# Patient Record
Sex: Female | Born: 1945
Health system: Southern US, Community
[De-identification: ages and names within clinical notes are randomized; demographics above are authoritative.]

## PROBLEM LIST (undated history)

## (undated) DIAGNOSIS — I10 Essential (primary) hypertension: Secondary | ICD-10-CM

## (undated) DIAGNOSIS — E0822 Diabetes mellitus due to underlying condition with diabetic chronic kidney disease: Principal | ICD-10-CM

## (undated) DIAGNOSIS — M1990 Unspecified osteoarthritis, unspecified site: Secondary | ICD-10-CM

## (undated) DIAGNOSIS — Z794 Long term (current) use of insulin: Principal | ICD-10-CM

## (undated) DIAGNOSIS — E785 Hyperlipidemia, unspecified: Secondary | ICD-10-CM

## (undated) DIAGNOSIS — N183 Chronic kidney disease, stage 3 (moderate): Principal | ICD-10-CM

## (undated) HISTORY — PX: OTHER SURGICAL HISTORY: SHX169

## (undated) HISTORY — DX: Hyperlipidemia, unspecified: E78.5

## (undated) HISTORY — DX: Chronic kidney disease, stage 3 (moderate): N18.3

## (undated) HISTORY — DX: Diabetes mellitus due to underlying condition with diabetic chronic kidney disease: E08.22

## (undated) HISTORY — DX: Unspecified osteoarthritis, unspecified site: M19.90

## (undated) HISTORY — DX: Long term (current) use of insulin: Z79.4

## (undated) HISTORY — PX: CATARACT EXTRACTION, BILATERAL: SHX1313

## (undated) HISTORY — DX: Essential (primary) hypertension: I10

---

## 2010-10-13 ENCOUNTER — Ambulatory Visit: Payer: Self-pay | Admitting: Cardiology

## 2012-02-20 ENCOUNTER — Encounter: Payer: Self-pay | Admitting: Family Medicine

## 2012-02-20 ENCOUNTER — Ambulatory Visit (INDEPENDENT_AMBULATORY_CARE_PROVIDER_SITE_OTHER): Payer: Medicare PPO | Admitting: Family Medicine

## 2012-02-20 VITALS — BP 200/92 | HR 74 | Resp 16 | Ht 69.5 in | Wt 192.4 lb

## 2012-02-20 DIAGNOSIS — I10 Essential (primary) hypertension: Secondary | ICD-10-CM

## 2012-02-20 DIAGNOSIS — IMO0002 Reserved for concepts with insufficient information to code with codable children: Secondary | ICD-10-CM

## 2012-02-20 DIAGNOSIS — Z1231 Encounter for screening mammogram for malignant neoplasm of breast: Secondary | ICD-10-CM

## 2012-02-20 DIAGNOSIS — E785 Hyperlipidemia, unspecified: Secondary | ICD-10-CM

## 2012-02-20 DIAGNOSIS — L609 Nail disorder, unspecified: Secondary | ICD-10-CM

## 2012-02-20 DIAGNOSIS — M5136 Other intervertebral disc degeneration, lumbar region: Secondary | ICD-10-CM | POA: Insufficient documentation

## 2012-02-20 DIAGNOSIS — E119 Type 2 diabetes mellitus without complications: Secondary | ICD-10-CM

## 2012-02-20 DIAGNOSIS — E663 Overweight: Secondary | ICD-10-CM

## 2012-02-20 DIAGNOSIS — L84 Corns and callosities: Secondary | ICD-10-CM

## 2012-02-20 NOTE — Progress Notes (Signed)
  Subjective:    Patient ID: Amanda Krueger, female    DOB: 1946/11/30, 66 y.o.   MRN: [id number]  HPI   Patient here to establish care. Previous PCP [facility].     Hypertension- she's had uncontrolled blood pressure for many years. Her systolic ranges the [id number] to 200s. She does not take her medication as prescribed because she does not like to take pills. She denies headache, shortness of breath, chest pain. She states she feels fine when her blood pressure is elevated. Denies change in vision.  Diabetes mellitus- she is unsure of her last A1c. She is due for labs. She states her fastings are in the 130s. She complains of hypoglycemia when she tries to eat healthy. Mild neuropathy in feet  nail turning black-she has to nails on both feet which are very thick long, long and have turned black. She would like for these to be removed.  Degenerative disc disease-she takes Celebrex as needed for her degenerative disc disease which is located in her back and neck. She's been on disability since 2000  Due for a mammogram Due for Pap smear  Eye Dr. Dr. Clovia Cuff    Review of Systems   GEN- denies fatigue, fever, weight loss,weakness, recent illness HEENT- denies eye drainage, change in vision, nasal discharge, CVS- denies chest pain, palpitations RESP- denies SOB, cough, wheeze ABD- denies N/V, change in stools, abd pain GU- denies dysuria, hematuria, dribbling, incontinence MSK- + joint pain, muscle aches, injury Neuro- denies headache, dizziness, syncope, seizure activity      Objective:   Physical Exam GEN- NAD, alert and oriented x3 HEENT- PERRL, EOMI, non injected sclera, pink conjunctiva, MMM, oropharynx clear Neck- Supple, no thyromegaly, no bruit  CVS- RRR, no murmur RESP-CTAB ABD- NABS,soft, NT,ND EXT-1+ pedal edema Pulses- Radial, DP- 2+ Feet- Right foot 2nd digit- thickened,long nail black in color, large callus of great toe- ?preulcer non tender, mild  erythema, left foot- similar nail on 3rd digit       Assessment & Plan:

## 2012-02-20 NOTE — Patient Instructions (Signed)
It was nice to meet you today! Please take your blood pressure medications as soon as you leave today! Get the labs done fasting- do not eat after midnight I will refer you to a podiatrist F/U in 2 weeks.

## 2012-02-21 ENCOUNTER — Encounter: Payer: Self-pay | Admitting: Family Medicine

## 2012-02-21 DIAGNOSIS — L989 Disorder of the skin and subcutaneous tissue, unspecified: Secondary | ICD-10-CM | POA: Insufficient documentation

## 2012-02-21 DIAGNOSIS — L609 Nail disorder, unspecified: Secondary | ICD-10-CM | POA: Insufficient documentation

## 2012-02-21 DIAGNOSIS — E663 Overweight: Secondary | ICD-10-CM | POA: Insufficient documentation

## 2012-02-21 DIAGNOSIS — L84 Corns and callosities: Secondary | ICD-10-CM | POA: Insufficient documentation

## 2012-02-21 NOTE — Assessment & Plan Note (Signed)
Concern for a pre-ulcerative callus on her great toe, will have podiatry treat

## 2012-02-21 NOTE — Assessment & Plan Note (Signed)
Check lipid panel  

## 2012-02-21 NOTE — Assessment & Plan Note (Signed)
Pt advised to take her medications daily as prescribed, she did not take anything this AM, based on today's visit it may be very difficult to get her to take medications. Explained risks of elevated blood pressure

## 2012-02-21 NOTE — Assessment & Plan Note (Signed)
Continue celebrex.  

## 2012-02-21 NOTE — Assessment & Plan Note (Signed)
Obtain A1C continue Lantus and Metformin

## 2012-02-21 NOTE — Assessment & Plan Note (Signed)
Her nails look very concerning based on the color change in growth, suprisingly there are only a few with this appearance, will refer to podiatry to have removed or treated

## 2012-02-22 NOTE — Progress Notes (Signed)
Patient ID: Amanda Krueger, female   DOB: [birth year], 66 y.o.   MRN: [id number] I received patient's records. Her diabetes has been very uncontrolled. Her last PCP was trying to send her to an endocrinologist. Her last A1c was 13.6% in May of 2012. LDL 168 She has been very noncompliant with her medications per her records

## 2012-03-02 LAB — LIPID PANEL
HDL: 62 mg/dL (ref >39)
Triglycerides: 157 mg/dL — ABNORMAL HIGH (ref <150)

## 2012-03-02 LAB — TSH: TSH: 0.995 u[IU]/mL (ref 0.350–4.500)

## 2012-03-02 LAB — COMPREHENSIVE METABOLIC PANEL
ALT: 18 U/L (ref 0–35)
AST: 16 U/L (ref 0–37)
Albumin: 3.8 g/dL (ref 3.5–5.2)
Alkaline Phosphatase: 90 U/L (ref 39–117)
Glucose, Bld: 129 mg/dL — ABNORMAL HIGH (ref 70–99)
Potassium: 4.3 mEq/L (ref 3.5–5.3)
Sodium: 138 mEq/L (ref 135–145)
Total Protein: 7 g/dL (ref 6.0–8.3)

## 2012-03-02 LAB — CBC
MCH: 28.3 pg (ref 26.0–34.0)
MCHC: 31.3 g/dL (ref 30.0–36.0)
Platelets: 189 10*3/uL (ref 150–400)
RBC: 4.46 MIL/uL (ref 3.87–5.11)
RDW: 14.6 % (ref 11.5–15.5)

## 2012-03-02 LAB — HEMOGLOBIN A1C: Hgb A1c MFr Bld: 11.3 % — ABNORMAL HIGH (ref <5.7)

## 2012-03-05 ENCOUNTER — Ambulatory Visit (INDEPENDENT_AMBULATORY_CARE_PROVIDER_SITE_OTHER): Payer: Medicare PPO | Admitting: Family Medicine

## 2012-03-05 ENCOUNTER — Encounter: Payer: Self-pay | Admitting: Family Medicine

## 2012-03-05 VITALS — BP 176/86 | HR 70 | Resp 18 | Ht 69.5 in | Wt 191.1 lb

## 2012-03-05 DIAGNOSIS — N182 Chronic kidney disease, stage 2 (mild): Secondary | ICD-10-CM

## 2012-03-05 DIAGNOSIS — E785 Hyperlipidemia, unspecified: Secondary | ICD-10-CM

## 2012-03-05 DIAGNOSIS — I10 Essential (primary) hypertension: Secondary | ICD-10-CM

## 2012-03-05 DIAGNOSIS — E119 Type 2 diabetes mellitus without complications: Secondary | ICD-10-CM

## 2012-03-05 MED ORDER — LISINOPRIL-HYDROCHLOROTHIAZIDE 20-12.5 MG PO TABS: 1.0000 | ORAL_TABLET | Freq: Every day | ORAL | Status: DC

## 2012-03-05 MED ORDER — INSULIN ASPART 100 UNIT/ML ~~LOC~~ SOLN: SUBCUTANEOUS | Status: DC

## 2012-03-05 MED ORDER — ATORVASTATIN CALCIUM 20 MG PO TABS: 20.0000 mg | ORAL_TABLET | Freq: Every day | ORAL | Status: AC

## 2012-03-05 MED ORDER — METOPROLOL TARTRATE 25 MG PO TABS: 25.0000 mg | ORAL_TABLET | Freq: Two times a day (BID) | ORAL | Status: DC

## 2012-03-05 NOTE — Patient Instructions (Signed)
For your diabetes- continue your lantus Start the novolog inject 4 units with each meal  Start the lipitor at bedtime Your blood pressure is high, I have started metoprolol twice a day  Continue your lisinopril and HCTZ, when you run out start the combination pill twice a day I will refer you to a diabetes specialist F/U in 4 weeks

## 2012-03-06 DIAGNOSIS — N185 Chronic kidney disease, stage 5: Secondary | ICD-10-CM | POA: Insufficient documentation

## 2012-03-06 DIAGNOSIS — N184 Chronic kidney disease, stage 4 (severe): Secondary | ICD-10-CM | POA: Insufficient documentation

## 2012-03-06 MED ORDER — LISINOPRIL-HYDROCHLOROTHIAZIDE 20-12.5 MG PO TABS: 1.0000 | ORAL_TABLET | Freq: Two times a day (BID) | ORAL | Status: DC

## 2012-03-06 NOTE — Assessment & Plan Note (Addendum)
Will add 4 units of short acting insulin with meals, refer to endocrine

## 2012-03-06 NOTE — Progress Notes (Signed)
  Subjective:    Patient ID: Amanda Krueger, female    DOB: 27-Oct-1946, 66 y.o.   MRN: [id number]  HPI  Pt here to f/u last visit, taking BP meds daily, no side effects DM- A1C obtained 11.3%, she is taking 45 units of insulin most nights, we discussed her previous non compliance with medications and previous PCP's notes, she admitted she does not like to take her medications and did not think she needed them. She has not heard from the foot doctor   Review of Systems - per above       Objective:   Physical Exam GEN- NAD, alert and oriented x3 CVS- RRR, no murmur RESP-CTAB EXT-1+ pedal edema Pulses- Radial, DP- 2+       Assessment & Plan:

## 2012-03-06 NOTE — Assessment & Plan Note (Signed)
We had a long discussion about her health and co-morbidities she agreed she needs to be compliant or will began to have complications. Will add Toprol to medications, her ACE will be combined with her HCTZ

## 2012-03-06 NOTE — Assessment & Plan Note (Signed)
Will monitor renal function, may need renal if this continues to decline

## 2012-03-06 NOTE — Assessment & Plan Note (Signed)
Start lipitor

## 2012-03-22 ENCOUNTER — Telehealth: Payer: Self-pay | Admitting: Family Medicine

## 2012-04-02 ENCOUNTER — Telehealth: Payer: Self-pay | Admitting: Family Medicine

## 2012-04-02 ENCOUNTER — Encounter: Payer: Self-pay | Admitting: Family Medicine

## 2012-04-02 ENCOUNTER — Ambulatory Visit (INDEPENDENT_AMBULATORY_CARE_PROVIDER_SITE_OTHER): Payer: Medicare PPO | Admitting: Family Medicine

## 2012-04-02 VITALS — BP 170/84 | HR 57 | Resp 15 | Ht 69.5 in | Wt 186.8 lb

## 2012-04-02 DIAGNOSIS — I10 Essential (primary) hypertension: Secondary | ICD-10-CM

## 2012-04-02 DIAGNOSIS — E119 Type 2 diabetes mellitus without complications: Secondary | ICD-10-CM

## 2012-04-02 MED ORDER — LOSARTAN POTASSIUM 50 MG PO TABS: 50.0000 mg | ORAL_TABLET | Freq: Every day | ORAL | Status: DC

## 2012-04-02 NOTE — Patient Instructions (Addendum)
To use up your medication- Take 2 of HCTZ 12.5mg  tablets daily, along with 1 lisinopril (40mg ) daily. When those are gone, then take the combination pill lisinopril/HCTZ- take 2 tablets daily  Take metoprolol twice a day Take the Cozaar  Stop the atenolol  Pt has referral from March 3rd for podiatry F/U 3 months

## 2012-04-03 NOTE — Assessment & Plan Note (Signed)
Blood pressure still uncontrolled but improved, today she brought all of her bottles in,Her Zestoretic though prescribed twice a day is only once a day on the bottle, she also has a bottle of Atenolol which she did not bring to our initial visit.   See instructions below. Basically she will be on HCTZ 25 mg total daily with lisinopril 40 mg daily. Cozaar 50 mg daily and Lopressor 25 mg twice a day.

## 2012-04-03 NOTE — Assessment & Plan Note (Signed)
Uncontrolled followed by endocrine,

## 2012-04-03 NOTE — Progress Notes (Signed)
  Subjective:    Patient ID: Amanda Krueger, female    DOB: [birth year], 66 y.o.   MRN: [id number]  HPI  Patient here for interim followup on diabetes and hypertension. She has all her medications here today for medication management. Reviewed last endocrine note. She's been followed by Dr. Dorris Fetch for her diabetes mellitus. She does note that she's had some hypoglycemia therefore she is on sliding scale insulin as well as Lantus 20 units. She has been trying to watch her diet  Review of Systems  GEN- denies fatigue, fever, weight loss,weakness, recent illness HEENT- denies eye drainage, change in vision, nasal discharge, CVS- denies chest pain, palpitations RESP- denies SOB, cough, wheeze ABD- denies N/V, change in stools, abd pain GU- denies dysuria, hematuria, dribbling, incontinence MSK- + joint pain, muscle aches, injury Neuro- denies headache, dizziness, syncope, seizure activity       Objective:   Physical Exam GEN- NAD, alert and oriented x3 CVS- RRR, no murmur RESP-CTAB EXT-1+ pedal edema R>L Pulses- Radial, DP- 2+       Assessment & Plan:

## 2012-05-09 ENCOUNTER — Other Ambulatory Visit: Payer: Self-pay | Admitting: Cardiology

## 2012-05-09 DIAGNOSIS — L97509 Non-pressure chronic ulcer of other part of unspecified foot with unspecified severity: Secondary | ICD-10-CM

## 2012-05-14 ENCOUNTER — Encounter (INDEPENDENT_AMBULATORY_CARE_PROVIDER_SITE_OTHER): Payer: Medicare PPO

## 2012-05-14 DIAGNOSIS — L97509 Non-pressure chronic ulcer of other part of unspecified foot with unspecified severity: Secondary | ICD-10-CM

## 2012-05-14 DIAGNOSIS — I739 Peripheral vascular disease, unspecified: Secondary | ICD-10-CM

## 2012-06-27 ENCOUNTER — Encounter: Payer: Self-pay | Admitting: Family Medicine

## 2012-07-02 ENCOUNTER — Ambulatory Visit: Payer: Medicare PPO | Admitting: Family Medicine

## 2012-08-13 ENCOUNTER — Ambulatory Visit (INDEPENDENT_AMBULATORY_CARE_PROVIDER_SITE_OTHER): Payer: Medicare PPO | Admitting: Family Medicine

## 2012-08-13 ENCOUNTER — Encounter: Payer: Self-pay | Admitting: Family Medicine

## 2012-08-13 VITALS — BP 164/84 | HR 62 | Resp 18 | Ht 69.5 in | Wt 170.1 lb

## 2012-08-13 DIAGNOSIS — E663 Overweight: Secondary | ICD-10-CM

## 2012-08-13 DIAGNOSIS — E785 Hyperlipidemia, unspecified: Secondary | ICD-10-CM

## 2012-08-13 DIAGNOSIS — I10 Essential (primary) hypertension: Secondary | ICD-10-CM

## 2012-08-13 DIAGNOSIS — E119 Type 2 diabetes mellitus without complications: Secondary | ICD-10-CM

## 2012-08-13 NOTE — Progress Notes (Signed)
  Subjective:    Patient ID: Amanda Krueger, female    DOB: 1946-11-20, 66 y.o.   MRN: [id number]  HPI Patient here to followup blood pressure. She's been followed by endocrinology for her diabetes mellitus. Today she brings in multiple bottles again she has not been taking the medications I prescribed on previous visit which is hydrochlorothiazide/lisinopril and metoprolol. She has no specific concerns today. She has difficulty sticking to the diet for diabetes she has lost 20 pounds however feels like she is starving herself at times. She's been seen by podiatry   Review of Systems  GEN- denies fatigue, fever, weight loss,weakness, recent illness HEENT- denies eye drainage, change in vision, nasal discharge, CVS- denies chest pain, palpitations RESP- denies SOB, cough, wheeze ABD- denies N/V, change in stools, abd pain GU- denies dysuria, hematuria, dribbling, incontinence MSK- + joint pain, muscle aches, injury Neuro- denies headache, dizziness, syncope, seizure activity      Objective:   Physical Exam GEN- NAD, alert and oriented x3 HEENT- PERRL, EOMI, non injected sclera, pink conjunctiva, MMM, oropharynx clear CVS- RRR, no murmur RESP-CTAB ABD-NABS,soft,NT,ND  EXT- 1+ right pedal edema Pulses- Radial, DP- 2+        Assessment & Plan:

## 2012-08-13 NOTE — Patient Instructions (Signed)
For blood pressure take lisinipril HCTZ- take 2 pills once a day Metoprolol take 1 pill twice a day  Continue cholesterol pill at bedtime  F/U 2 weeks

## 2012-08-14 NOTE — Assessment & Plan Note (Signed)
Much improved, though diet has been hard for her to adhere to and it seem she has regressed some. Follow up with endocrine, labs repeated early today

## 2012-08-14 NOTE — Assessment & Plan Note (Signed)
Pt still confused on medications, continue zesteoretic and Toprol, recheck in 2 weeks

## 2012-08-16 NOTE — Assessment & Plan Note (Signed)
20lb weight loss, with change in diet and adherence to diabetes regimen

## 2012-08-16 NOTE — Assessment & Plan Note (Signed)
Continue lipitor, recent FLP much improved

## 2012-08-26 ENCOUNTER — Telehealth: Payer: Self-pay | Admitting: Family Medicine

## 2012-08-26 ENCOUNTER — Other Ambulatory Visit: Payer: Self-pay

## 2012-08-26 MED ORDER — METOPROLOL TARTRATE 25 MG PO TABS: 25.0000 mg | ORAL_TABLET | Freq: Two times a day (BID) | ORAL | Status: DC

## 2012-08-26 MED ORDER — LISINOPRIL-HYDROCHLOROTHIAZIDE 20-12.5 MG PO TABS: 2.0000 | ORAL_TABLET | Freq: Every day | ORAL | Status: DC

## 2012-08-26 NOTE — Telephone Encounter (Signed)
[facility]s sent to [facility] only as requested by patient.

## 2012-08-27 ENCOUNTER — Ambulatory Visit: Payer: Medicare PPO | Admitting: Family Medicine

## 2013-11-03 ENCOUNTER — Telehealth: Payer: Self-pay | Admitting: Family Medicine

## 2013-11-03 MED ORDER — INSULIN GLARGINE 100 UNIT/ML ~~LOC~~ SOLN: 45.0000 [IU] | Freq: Every day | SUBCUTANEOUS | Status: DC

## 2013-11-03 NOTE — Telephone Encounter (Signed)
Pt called in out of insulin, has not been seen in 1 year, advised her she has to schedule appt, will refill her lantus to get her to appt only

## 2013-11-07 ENCOUNTER — Encounter: Payer: Self-pay | Admitting: Family Medicine

## 2013-11-07 ENCOUNTER — Ambulatory Visit (INDEPENDENT_AMBULATORY_CARE_PROVIDER_SITE_OTHER): Payer: 59 | Admitting: Family Medicine

## 2013-11-07 VITALS — BP 118/78 | HR 78 | Temp 97.7°F | Resp 18 | Ht 69.0 in | Wt 178.0 lb

## 2013-11-07 DIAGNOSIS — L97409 Non-pressure chronic ulcer of unspecified heel and midfoot with unspecified severity: Secondary | ICD-10-CM

## 2013-11-07 DIAGNOSIS — L97521 Non-pressure chronic ulcer of other part of left foot limited to breakdown of skin: Secondary | ICD-10-CM

## 2013-11-07 DIAGNOSIS — I10 Essential (primary) hypertension: Secondary | ICD-10-CM

## 2013-11-07 DIAGNOSIS — Z23 Encounter for immunization: Secondary | ICD-10-CM

## 2013-11-07 DIAGNOSIS — E785 Hyperlipidemia, unspecified: Secondary | ICD-10-CM

## 2013-11-07 DIAGNOSIS — E119 Type 2 diabetes mellitus without complications: Secondary | ICD-10-CM

## 2013-11-07 DIAGNOSIS — N182 Chronic kidney disease, stage 2 (mild): Secondary | ICD-10-CM

## 2013-11-07 MED ORDER — INSULIN ASPART 100 UNIT/ML ~~LOC~~ SOLN: 5.0000 [IU] | Freq: Three times a day (TID) | SUBCUTANEOUS | Status: DC

## 2013-11-07 MED ORDER — LISINOPRIL-HYDROCHLOROTHIAZIDE 20-12.5 MG PO TABS: 2.0000 | ORAL_TABLET | Freq: Every day | ORAL | Status: DC

## 2013-11-07 NOTE — Patient Instructions (Addendum)
Release of records- [place] Schedule apt with [place] [facility] Lantus 30 units  Novolog- 5 units with each meal Take the Januvia once a day We will call with lab results Prevnar 13 given Check blood glucose fasting, before meals and records F/U 4 weeks

## 2013-11-08 DIAGNOSIS — L97509 Non-pressure chronic ulcer of other part of unspecified foot with unspecified severity: Secondary | ICD-10-CM | POA: Insufficient documentation

## 2013-11-08 NOTE — Assessment & Plan Note (Signed)
She's been followed by podiatry previously been followed by the wound Center. She's had minimal improvement in the callused areas as well as the superficial ulcers. I will obtain the records from her foot Dr. She may need to be seen by vascular

## 2013-11-08 NOTE — Progress Notes (Signed)
  Subjective:    Patient ID: Amanda Krueger, female    DOB: [birth year], 67 y.o.   MRN: [id number]  HPI  Patient here to follow chronic medical problems. She's not been seen since 2013. She states that she became upset after she was placed on $300 medication by her endocrinologist therefore she stopped all of her medications over a period of months. She has been trying to take her insulin but was not taken on a regular basis. She also denied here to any diet. She is ready to restart her medications and get herself back on track. She has appointment are scheduled with endocrinology for the first week of December.  She continues to suffer with chronic ulcers of her left foot. She's been followed by podiatry very closely. She was sent to the wound Center in [facility] however she did not fill they would do anything to help therefore she stopped going. States she was told that she needed a very expensive orthotic she did not want to pursue this.  Review of Systems  GEN- denies fatigue, fever, weight loss,weakness, recent illness HEENT- denies eye drainage, change in vision, nasal discharge, CVS- denies chest pain, palpitations RESP- denies SOB, cough, wheeze ABD- denies N/V, change in stools, abd pain GU- denies dysuria, hematuria, dribbling, incontinence MSK- denies joint pain, muscle aches, injury Neuro- denies headache, dizziness, syncope, seizure activity      Objective:   Physical Exam GEN- NAD, alert and oriented x3 HEENT- PERRL, EOMI, non injected sclera, pink conjunctiva, MMM, oropharynx clear Neck- Supple, CVS- RRR, no murmur RESP-CTAB EXT- pedal edema Pulses- Radial, DP- 2+        Assessment & Plan:

## 2013-11-08 NOTE — Assessment & Plan Note (Signed)
Continue ACE/HCTZ

## 2013-11-08 NOTE — Assessment & Plan Note (Signed)
She had stopped her statin drug Lipitor for cholesterol panel needs to be rechecked

## 2013-11-08 NOTE — Assessment & Plan Note (Addendum)
A1c uncontrolled 11% start 5 units of NovoLog with each meal. She will continue 45 units of Lantus at bedtime. She is followup with endocrinology. Also restart her on Januvia she was unable to afford tradjenta  recheck her renal function this is why she was taken off of metformin

## 2013-11-08 NOTE — Assessment & Plan Note (Signed)
Check renal function. 

## 2013-11-10 LAB — LIPID PANEL

## 2013-11-10 LAB — CBC WITH DIFFERENTIAL/PLATELET
Basophils Absolute: 0 10*3/uL (ref 0.0–0.1)
Eosinophils Absolute: 0.1 10*3/uL (ref 0.0–0.7)
Eosinophils Relative: 2 % (ref 0–5)
HCT: 38.4 % (ref 36.0–46.0)
Lymphs Abs: 2.3 10*3/uL (ref 0.7–4.0)
MCH: 29.3 pg (ref 26.0–34.0)
MCHC: 34.4 g/dL (ref 30.0–36.0)
MCV: 85.1 fL (ref 78.0–100.0)
Monocytes Absolute: 0.3 10*3/uL (ref 0.1–1.0)
Neutrophils Relative %: 44 % (ref 43–77)
Platelets: 208 10*3/uL (ref 150–400)
RBC: 4.51 MIL/uL (ref 3.87–5.11)

## 2013-11-10 LAB — COMPREHENSIVE METABOLIC PANEL

## 2013-12-03 ENCOUNTER — Ambulatory Visit (INDEPENDENT_AMBULATORY_CARE_PROVIDER_SITE_OTHER): Payer: 59 | Admitting: Family Medicine

## 2013-12-03 ENCOUNTER — Encounter: Payer: Self-pay | Admitting: Family Medicine

## 2013-12-03 VITALS — BP 150/80 | HR 102 | Temp 98.3°F | Resp 18 | Ht 70.0 in | Wt 174.0 lb

## 2013-12-03 DIAGNOSIS — I10 Essential (primary) hypertension: Secondary | ICD-10-CM

## 2013-12-03 DIAGNOSIS — M25569 Pain in unspecified knee: Secondary | ICD-10-CM

## 2013-12-03 DIAGNOSIS — E119 Type 2 diabetes mellitus without complications: Secondary | ICD-10-CM

## 2013-12-03 DIAGNOSIS — M25562 Pain in left knee: Secondary | ICD-10-CM

## 2013-12-03 MED ORDER — DICLOFENAC SODIUM 75 MG PO TBEC: 75.0000 mg | DELAYED_RELEASE_TABLET | Freq: Two times a day (BID) | ORAL | Status: DC

## 2013-12-03 MED ORDER — HYDROCODONE-ACETAMINOPHEN 5-325 MG PO TABS: 1.0000 | ORAL_TABLET | ORAL | Status: DC | PRN

## 2013-12-03 MED ORDER — METOPROLOL TARTRATE 25 MG PO TABS: 25.0000 mg | ORAL_TABLET | Freq: Two times a day (BID) | ORAL | Status: DC

## 2013-12-03 NOTE — Patient Instructions (Signed)
Get the xray of the knee ICE the knee Start the diclofenac twice a day with food Pain medications  New blood pressure medications F/U 3 months

## 2013-12-03 NOTE — Assessment & Plan Note (Signed)
She does have a joint effusion today I think this is secondary to osteoarthritis however there is a possibility of gout and she has had some other random joint as well as in the past and she is on HCTZ. I will check a uric acid. I will get imaging of the knee. She will start diclofenac twice a day she's also been given hydrocodone. She will use ice for the knee. We may need orthopedics

## 2013-12-03 NOTE — Progress Notes (Signed)
   Subjective:    Patient ID: Amanda Krueger, female    DOB: Mar 19, 1946, 67 y.o.   MRN: [id number]  HPI  Patient here with left knee pain and swelling for the past 3 days. She denies any particular injury. She's not had any knee pain for greater than 3 years. She has had history of degenerative disc disease in her lower back and at that time was diagnosed had some knee pain which she had injection in her left knee. She's been using a knee brace has not used any ice it has only taken a couple of doses of Naprosyn which did not help.  Review of Systems  GEN- denies fatigue, fever, weight loss,weakness, recent illness MSK- + joint pain, muscle aches, injury       Objective:   Physical Exam GEN-NAD,alert and oriented x 3 MSK- Back NT, neg SLR, Left Knee- +swelling, mild warmth, decreased ROM, TTP anterior and lateral knee, ligaments in tact, Right Knee- normal inspection , FROM       Assessment & Plan:

## 2013-12-03 NOTE — Assessment & Plan Note (Signed)
That she is reestablish with endocrinology. She states that the knee pain started after taking Januvia but she is not quite sure if that was actually 4 weeks ago

## 2013-12-03 NOTE — Assessment & Plan Note (Signed)
Blood pressure is still uncontrolled. I will add the Toprol 25 mg twice a day

## 2013-12-04 ENCOUNTER — Ambulatory Visit (HOSPITAL_COMMUNITY)
Admission: RE | Admit: 2013-12-04 | Discharge: 2013-12-04 | Disposition: A | Discharge status: Home / Self Care | Payer: Medicare PPO | Source: Ambulatory Visit | Attending: Family Medicine | Admitting: Family Medicine

## 2013-12-04 DIAGNOSIS — M25569 Pain in unspecified knee: Secondary | ICD-10-CM | POA: Insufficient documentation

## 2013-12-04 DIAGNOSIS — M25562 Pain in left knee: Secondary | ICD-10-CM

## 2013-12-04 DIAGNOSIS — M25869 Other specified joint disorders, unspecified knee: Secondary | ICD-10-CM | POA: Insufficient documentation

## 2013-12-05 ENCOUNTER — Ambulatory Visit: Payer: 59 | Admitting: Family Medicine

## 2013-12-05 ENCOUNTER — Telehealth: Payer: Self-pay | Admitting: Family Medicine

## 2013-12-05 DIAGNOSIS — M25862 Other specified joint disorders, left knee: Secondary | ICD-10-CM

## 2013-12-05 DIAGNOSIS — M179 Osteoarthritis of knee, unspecified: Secondary | ICD-10-CM

## 2013-12-05 DIAGNOSIS — M1710 Unilateral primary osteoarthritis, unspecified knee: Secondary | ICD-10-CM

## 2013-12-05 MED ORDER — TRAMADOL HCL 50 MG PO TABS: 50.0000 mg | ORAL_TABLET | Freq: Three times a day (TID) | ORAL | Status: DC | PRN

## 2013-12-05 NOTE — Telephone Encounter (Signed)
Discussed results with pt Norco made her sick Ultram sent MRI of knee and ortho referra

## 2013-12-15 ENCOUNTER — Ambulatory Visit (HOSPITAL_COMMUNITY)
Admission: RE | Admit: 2013-12-15 | Discharge: 2013-12-15 | Disposition: A | Discharge status: Home / Self Care | Payer: Medicare PPO | Source: Ambulatory Visit | Attending: Family Medicine | Admitting: Family Medicine

## 2013-12-15 DIAGNOSIS — M25469 Effusion, unspecified knee: Secondary | ICD-10-CM | POA: Insufficient documentation

## 2013-12-15 DIAGNOSIS — M7120 Synovial cyst of popliteal space [Baker], unspecified knee: Secondary | ICD-10-CM | POA: Insufficient documentation

## 2013-12-15 DIAGNOSIS — M259 Joint disorder, unspecified: Secondary | ICD-10-CM | POA: Insufficient documentation

## 2013-12-15 DIAGNOSIS — M179 Osteoarthritis of knee, unspecified: Secondary | ICD-10-CM

## 2013-12-15 DIAGNOSIS — M1710 Unilateral primary osteoarthritis, unspecified knee: Secondary | ICD-10-CM

## 2013-12-15 DIAGNOSIS — M25569 Pain in unspecified knee: Secondary | ICD-10-CM | POA: Insufficient documentation

## 2013-12-15 DIAGNOSIS — M23305 Other meniscus derangements, unspecified medial meniscus, unspecified knee: Secondary | ICD-10-CM | POA: Insufficient documentation

## 2013-12-15 DIAGNOSIS — M25862 Other specified joint disorders, left knee: Secondary | ICD-10-CM

## 2013-12-17 ENCOUNTER — Encounter: Payer: Self-pay | Admitting: Orthopedic Surgery

## 2013-12-17 ENCOUNTER — Ambulatory Visit (INDEPENDENT_AMBULATORY_CARE_PROVIDER_SITE_OTHER): Payer: Medicare PPO | Admitting: Orthopedic Surgery

## 2013-12-17 VITALS — BP 172/93 | Ht 70.0 in | Wt 170.0 lb

## 2013-12-17 DIAGNOSIS — M25569 Pain in unspecified knee: Secondary | ICD-10-CM

## 2013-12-17 DIAGNOSIS — L0291 Cutaneous abscess, unspecified: Secondary | ICD-10-CM

## 2013-12-17 DIAGNOSIS — M25562 Pain in left knee: Secondary | ICD-10-CM

## 2013-12-17 DIAGNOSIS — L0390 Cellulitis, unspecified: Secondary | ICD-10-CM

## 2013-12-17 DIAGNOSIS — M009 Pyogenic arthritis, unspecified: Secondary | ICD-10-CM

## 2013-12-17 MED ORDER — OXYCODONE-ACETAMINOPHEN 5-325 MG PO TABS: 1.0000 | ORAL_TABLET | ORAL | Status: DC | PRN

## 2013-12-17 NOTE — Progress Notes (Signed)
   Subjective:    Patient ID: Amanda Krueger, female    DOB: 08/11/1946, 67 y.o.   MRN: [id number]  Chief Complaint  Patient presents with  . Knee Pain    Left knee pain, no injury. Referred by Dr. Buelah Manis.    Knee Pain    67 year old female presents with a long history of problems in her left knee for over 7 years. She got 2 cortisone injections and then read an article about placing soap around her painful area and at that and was okay until about 3 weeks ago when she experienced sudden onset of pain and stiffness in her left knee. The pain is 10 out of 10 it's constant she's lost the ability to flex and extend the knee and she's been using a walker a cane and a wheelchair to ambulate she denies fever, nausea vomiting. Constipation noted. Swelling noted. He does complain of chills at times other systems are negative.  She has a history of lumbar disc disease at one point was going to have surgery and then it was felt that it wouldn't help her. She eventually recovered from that that workup was 15 years ago  She is diabetic she had blood tests for gout normal is surgery on the great toe.  Medications are hydrochlorothiazide lisinopril, metoprolol, diclofenac, Lantus, tramadol. Family history heart disease arthritis cancer diabetes  Social history disabled and divorced does not smoke or drink    Review of Systems     Objective:   Physical Exam  BP 172/93  Ht 5\' 10"  (1.778 m)  Wt 170 lb (77.111 kg)  BMI 24.39 kg/m2 General appearance is normal, the patient is alert and oriented x3 with normal mood and affect. She is weightbearing on the right none on the left because she can't get the foot down to the ground because he is stuck in flexion Upper extremity exam  The right and left upper extremity:   Inspection and palpation revealed no abnormalities in the upper extremities.   Range of motion is full without contracture.  Motor exam is normal with grade 5 strength.  The  joints are fully reduced without subluxation.  There is no atrophy or tremor and muscle tone is normal.  All joints are stable.   Right leg has normal range of motion ligaments are stable motor exam is normal and skin is intact  The left knee is not warm to touch he does have an effusion her knee is at about 90 flexion and extended to about 70 she complains of pain is tender diffusely. He feels stable but the exam is compromised scans intact without a rash or cuts or abrasions muscle tone is normal  Cardiovascular pulses are normal  Sensation is normal  Lymph nodes normal  Normal reflexes  Balance could not be assessed        Assessment & Plan:  X-rays show severe arthritis of the knee with grade 4 changes medial and lateral joint  MRI shows large joint effusion air in the joint severe arthritic changes and subchondral cyst formation  Aspiration 30 cc of cloudy purulent-like fluid  Sent for culture cell count crystal analysis  Start Percocet Return 1 week  Asp iration LEFT knee.  Verbal consent was obtained, timeout was completed.  Sterile prep of the LEFT knee.  Lateral approach, suprapatellar  Aspiration of 30 cc of cloudy purulent-like fluid  No complications.

## 2013-12-18 LAB — SYNOVIAL CELL COUNT + DIFF, W/ CRYSTALS
Crystals, Fluid: NONE SEEN
Eosinophils-Synovial: 0 % (ref 0–1)
Lymphocytes-Synovial Fld: 0 % (ref 0–20)
Monocyte/Macrophage: 5 % — ABNORMAL LOW (ref 50–90)
Neutrophil, Synovial: 95 % — ABNORMAL HIGH (ref 0–25)
WBC, Synovial: 111865 cu mm — ABNORMAL HIGH (ref 0–200)

## 2013-12-25 ENCOUNTER — Encounter (HOSPITAL_COMMUNITY)
Admission: RE | Admit: 2013-12-25 | Discharge: 2013-12-25 | Disposition: A | Discharge status: Home / Self Care | Payer: Medicare HMO | Source: Ambulatory Visit | Attending: Orthopedic Surgery | Admitting: Orthopedic Surgery

## 2013-12-25 ENCOUNTER — Encounter (HOSPITAL_COMMUNITY): Payer: Self-pay

## 2013-12-25 ENCOUNTER — Ambulatory Visit (INDEPENDENT_AMBULATORY_CARE_PROVIDER_SITE_OTHER): Payer: Medicare HMO | Admitting: Orthopedic Surgery

## 2013-12-25 ENCOUNTER — Other Ambulatory Visit: Payer: Self-pay

## 2013-12-25 VITALS — BP 199/118 | Ht 70.0 in | Wt 170.0 lb

## 2013-12-25 DIAGNOSIS — IMO0002 Reserved for concepts with insufficient information to code with codable children: Secondary | ICD-10-CM

## 2013-12-25 DIAGNOSIS — M009 Pyogenic arthritis, unspecified: Secondary | ICD-10-CM

## 2013-12-25 LAB — BASIC METABOLIC PANEL
BUN: 27 mg/dL — AB (ref 6–23)
CHLORIDE: 89 meq/L — AB (ref 96–112)
CO2: 30 mEq/L (ref 19–32)
CREATININE: 1.31 mg/dL — AB (ref 0.50–1.10)
Calcium: 9.3 mg/dL (ref 8.4–10.5)
GFR calc Af Amer: 48 mL/min — ABNORMAL LOW (ref >90)
GFR calc non Af Amer: 41 mL/min — ABNORMAL LOW (ref >90)
Glucose, Bld: 278 mg/dL — ABNORMAL HIGH (ref 70–99)
POTASSIUM: 4.8 meq/L (ref 3.7–5.3)
Sodium: 132 mEq/L — ABNORMAL LOW (ref 137–147)

## 2013-12-25 LAB — CBC
HEMATOCRIT: 30.5 % — AB (ref 36.0–46.0)
Hemoglobin: 9.8 g/dL — ABNORMAL LOW (ref 12.0–15.0)
MCH: 26.9 pg (ref 26.0–34.0)
MCHC: 32.1 g/dL (ref 30.0–36.0)
MCV: 83.8 fL (ref 78.0–100.0)
Platelets: 442 10*3/uL — ABNORMAL HIGH (ref 150–400)
RBC: 3.64 MIL/uL — ABNORMAL LOW (ref 3.87–5.11)
RDW: 14.6 % (ref 11.5–15.5)
WBC: 11 10*3/uL — ABNORMAL HIGH (ref 4.0–10.5)

## 2013-12-25 NOTE — Patient Instructions (Signed)
ILLA CAVINDER  12/25/2013   Your procedure is scheduled on: 12/26/2012  Report to [facility] at  1125  AM.  Call this number if you have problems the morning of surgery: [phone]   Remember:   Do not eat food or drink liquids after midnight.   Take these medicines the morning of surgery with A SIP OF WATER:  Celebrex, voltaren, norco, lisinopril, lopressor, percocet, ultram. Take 1/2 your regular insulin dosage tonight and no diabetic medicine in the morning.   Do not wear jewelry, make-up or nail polish.  Do not wear lotions, powders, or perfumes.   Do not shave 48 hours prior to surgery. Men may shave face and neck.  Do not bring valuables to the hospital.  [facility] is not responsible for any belongings or valuables.               Contacts, dentures or bridgework may not be worn into surgery.  Leave suitcase in the car. After surgery it may be brought to your room.  For patients admitted to the hospital, discharge time is determined by your treatment team.               Patients discharged the day of surgery will not be allowed to drive home.  Name and phone number of your driver: family  Special Instructions: Shower using CHG 2 nights before surgery and the night before surgery.  If you shower the day of surgery use CHG.  Use special wash - you have one bottle of CHG for all showers.  You should use approximately 1/3 of the bottle for each shower.   Please read over the following fact sheets that you were given: Pain Booklet, Coughing and Deep Breathing, Surgical Site Infection Prevention, Anesthesia Post-op Instructions and Care and Recovery After Surgery Arthroscopic Procedure, Knee An arthroscopic procedure can find what is wrong with your knee. PROCEDURE Arthroscopy is a surgical technique that allows your orthopedic surgeon to diagnose and treat your knee injury with accuracy. They will look into your knee through a small instrument. This is almost like a small  (pencil sized) telescope. Because arthroscopy affects your knee less than open knee surgery, you can anticipate a more rapid recovery. Taking an active role by following your caregiver's instructions will help with rapid and complete recovery. Use crutches, rest, elevation, ice, and knee exercises as instructed. The length of recovery depends on various factors including type of injury, age, physical condition, medical conditions, and your rehabilitation. Your knee is the joint between the large bones (femur and tibia) in your leg. Cartilage covers these bone ends which are smooth and slippery and allow your knee to bend and move smoothly. Two menisci, thick, semi-lunar shaped pads of cartilage which form a rim inside the joint, help absorb shock and stabilize your knee. Ligaments bind the bones together and support your knee joint. Muscles move the joint, help support your knee, and take stress off the joint itself. Because of this all programs and physical therapy to rehabilitate an injured or repaired knee require rebuilding and strengthening your muscles. AFTER THE PROCEDURE  After the procedure, you will be moved to a recovery area until most of the effects of the medication have worn off. Your caregiver will discuss the test results with you.  Only take over-the-counter or prescription medicines for pain, discomfort, or fever as directed by your caregiver. SEEK MEDICAL CARE IF:   You have increased bleeding from your wounds.  You see redness, swelling, or have increasing pain in your wounds.  You have pus coming from your wound.  You have an oral temperature above 102 F (38.9 C).  You notice a bad smell coming from the wound or dressing.  You have severe pain with any motion of your knee. SEEK IMMEDIATE MEDICAL CARE IF:   You develop a rash.  You have difficulty breathing.  You have any allergic problems. Document Released: 12/01/2000 Document Revised: 02/26/2012 Document Reviewed:  06/24/2008 [facility] Patient Information 2014 [place]. PATIENT INSTRUCTIONS POST-ANESTHESIA  IMMEDIATELY FOLLOWING SURGERY:  Do not drive or operate machinery for the first twenty four hours after surgery.  Do not make any important decisions for twenty four hours after surgery or while taking narcotic pain medications or sedatives.  If you develop intractable nausea and vomiting or a severe headache please notify your doctor immediately.  FOLLOW-UP:  Please make an appointment with your surgeon as instructed. You do not need to follow up with anesthesia unless specifically instructed to do so.  WOUND CARE INSTRUCTIONS (if applicable):  Keep a dry clean dressing on the anesthesia/puncture wound site if there is drainage.  Once the wound has quit draining you may leave it open to air.  Generally you should leave the bandage intact for twenty four hours unless there is drainage.  If the epidural site drains for more than 36-48 hours please call the anesthesia department.  QUESTIONS?:  Please feel free to call your physician or the hospital operator if you have any questions, and they will be happy to assist you.

## 2013-12-25 NOTE — Progress Notes (Signed)
Patient ID: Amanda Krueger, female   DOB: [birth year], 68 y.o.   MRN: [id number] Chief complaint pain left knee  The history from December 31 is incorporated by reference  The patient's lab results show 100,000 white cells 95% neutrophils strep viridans bacteria  Patient will be scheduled for arthroscopic lavage and debridement of the left knee for presumed subacute infection. She also has much arthritis of the knee. She also has loss of motion so we will do manipulation  She'll be scheduled for surgery for tomorrow  History and physical be dictated and incorporated by reference.  Decision for surgery today. Surgery procedure tomorrow.

## 2013-12-25 NOTE — Patient Instructions (Addendum)
SALK LEFT KNEE WITH MANIPULATION   Arthroscopic Procedure, Knee An arthroscopic procedure can find what is wrong with your knee. PROCEDURE Arthroscopy is a surgical technique that allows your orthopedic surgeon to diagnose and treat your knee injury with accuracy. They will look into your knee through a small instrument. This is almost like a small (pencil sized) telescope. Because arthroscopy affects your knee less than open knee surgery, you can anticipate a more rapid recovery. Taking an active role by following your caregiver's instructions will help with rapid and complete recovery. Use crutches, rest, elevation, ice, and knee exercises as instructed. The length of recovery depends on various factors including type of injury, age, physical condition, medical conditions, and your rehabilitation. Your knee is the joint between the large bones (femur and tibia) in your leg. Cartilage covers these bone ends which are smooth and slippery and allow your knee to bend and move smoothly. Two menisci, thick, semi-lunar shaped pads of cartilage which form a rim inside the joint, help absorb shock and stabilize your knee. Ligaments bind the bones together and support your knee joint. Muscles move the joint, help support your knee, and take stress off the joint itself. Because of this all programs and physical therapy to rehabilitate an injured or repaired knee require rebuilding and strengthening your muscles. AFTER THE PROCEDURE  After the procedure, you will be moved to a recovery area until most of the effects of the medication have worn off. Your caregiver will discuss the test results with you.   Only take over-the-counter or prescription medicines for pain, discomfort, or fever as directed by your caregiver.    You have been scheduled for arthroscocpic knee surgery.  All surgeries carry some risk.  Remember you always have the option of continued nonsurgical treatment. However in this situation the  risks vs. the benefits favor surgery as the best treatment option. The risks of the surgery includes the following but is not limited to bleeding, infection, pulmonary embolus, death from anesthesia, nerve injury vascular injury or need for further surgery, continued pain.  Specific to this procedure the following risks and complications are rare but possible Stiffness, pain, weakness, giving out  I expect  recovery will be in 3-4 weeks some patients take 6 weeks.  You  will need physical therapy after the procedure  Stop any blood thinning medication: such as warfarin, coumadin, naprosyn, ibuprofen, advil, diclofenac, aspirin

## 2013-12-26 ENCOUNTER — Telehealth: Payer: Self-pay | Admitting: Orthopedic Surgery

## 2013-12-26 ENCOUNTER — Encounter (HOSPITAL_COMMUNITY)
Admission: RE | Disposition: A | Discharge status: Home / Self Care | Payer: Self-pay | Source: Ambulatory Visit | Attending: Orthopedic Surgery

## 2013-12-26 ENCOUNTER — Encounter (HOSPITAL_COMMUNITY): Payer: Self-pay | Admitting: *Deleted

## 2013-12-26 ENCOUNTER — Ambulatory Visit (HOSPITAL_COMMUNITY)
Admission: RE | Admit: 2013-12-26 | Discharge: 2013-12-26 | Disposition: A | Discharge status: Home / Self Care | Payer: Medicare HMO | Source: Ambulatory Visit | Attending: Orthopedic Surgery | Admitting: Orthopedic Surgery

## 2013-12-26 ENCOUNTER — Encounter (HOSPITAL_COMMUNITY): Payer: Medicare HMO | Admitting: Anesthesiology

## 2013-12-26 ENCOUNTER — Ambulatory Visit (HOSPITAL_COMMUNITY): Payer: Medicare HMO | Admitting: Anesthesiology

## 2013-12-26 DIAGNOSIS — L089 Local infection of the skin and subcutaneous tissue, unspecified: Secondary | ICD-10-CM

## 2013-12-26 DIAGNOSIS — M1990 Unspecified osteoarthritis, unspecified site: Secondary | ICD-10-CM | POA: Insufficient documentation

## 2013-12-26 DIAGNOSIS — M25562 Pain in left knee: Secondary | ICD-10-CM

## 2013-12-26 DIAGNOSIS — M6580 Other synovitis and tenosynovitis, unspecified site: Secondary | ICD-10-CM | POA: Insufficient documentation

## 2013-12-26 DIAGNOSIS — M009 Pyogenic arthritis, unspecified: Secondary | ICD-10-CM

## 2013-12-26 DIAGNOSIS — Z01810 Encounter for preprocedural cardiovascular examination: Secondary | ICD-10-CM | POA: Insufficient documentation

## 2013-12-26 DIAGNOSIS — IMO0002 Reserved for concepts with insufficient information to code with codable children: Secondary | ICD-10-CM | POA: Insufficient documentation

## 2013-12-26 DIAGNOSIS — E119 Type 2 diabetes mellitus without complications: Secondary | ICD-10-CM | POA: Insufficient documentation

## 2013-12-26 DIAGNOSIS — S83289A Other tear of lateral meniscus, current injury, unspecified knee, initial encounter: Secondary | ICD-10-CM | POA: Insufficient documentation

## 2013-12-26 DIAGNOSIS — I10 Essential (primary) hypertension: Secondary | ICD-10-CM | POA: Insufficient documentation

## 2013-12-26 DIAGNOSIS — Z01812 Encounter for preprocedural laboratory examination: Secondary | ICD-10-CM | POA: Insufficient documentation

## 2013-12-26 DIAGNOSIS — X58XXXA Exposure to other specified factors, initial encounter: Secondary | ICD-10-CM | POA: Insufficient documentation

## 2013-12-26 DIAGNOSIS — Z79899 Other long term (current) drug therapy: Secondary | ICD-10-CM | POA: Insufficient documentation

## 2013-12-26 DIAGNOSIS — E11628 Type 2 diabetes mellitus with other skin complications: Secondary | ICD-10-CM

## 2013-12-26 HISTORY — PX: EXAM UNDER ANESTHESIA WITH MANIPULATION OF KNEE: SHX5816

## 2013-12-26 HISTORY — PX: KNEE ARTHROSCOPY: SHX127

## 2013-12-26 HISTORY — PX: WOUND DEBRIDEMENT: SHX247

## 2013-12-26 HISTORY — PX: SYNOVECTOMY: SHX5180

## 2013-12-26 LAB — BODY FLUID CULTURE: Gram Stain: NONE SEEN

## 2013-12-26 LAB — GLUCOSE, CAPILLARY
GLUCOSE-CAPILLARY: 161 mg/dL — AB (ref 70–99)
Glucose-Capillary: 165 mg/dL — ABNORMAL HIGH (ref 70–99)

## 2013-12-26 SURGERY — ARTHROSCOPY, KNEE
Anesthesia: General | Site: Toe | Laterality: Left

## 2013-12-26 MED ORDER — MIDAZOLAM HCL 2 MG/2ML IJ SOLN
INTRAMUSCULAR | Status: AC
  Filled 2013-12-26: qty 2

## 2013-12-26 MED ORDER — KETOROLAC TROMETHAMINE 30 MG/ML IJ SOLN
30.0000 mg | Freq: Once | INTRAMUSCULAR | Status: AC
  Administered 2013-12-26: 30 mg via INTRAVENOUS
  Filled 2013-12-26: qty 1

## 2013-12-26 MED ORDER — SODIUM CHLORIDE 0.9 % IR SOLN: Status: DC | PRN

## 2013-12-26 MED ORDER — FENTANYL CITRATE 0.05 MG/ML IJ SOLN
25.0000 ug | INTRAMUSCULAR | Status: DC | PRN
  Administered 2013-12-26: 50 ug via INTRAVENOUS

## 2013-12-26 MED ORDER — FENTANYL CITRATE 0.05 MG/ML IJ SOLN
INTRAMUSCULAR | Status: AC
  Filled 2013-12-26: qty 2

## 2013-12-26 MED ORDER — CHLORHEXIDINE GLUCONATE 4 % EX LIQD
60.0000 mL | Freq: Once | CUTANEOUS | Status: DC
Start: 2013-12-26 — End: 2013-12-26

## 2013-12-26 MED ORDER — BUPIVACAINE-EPINEPHRINE PF 0.5-1:200000 % IJ SOLN
INTRAMUSCULAR | Status: AC
  Filled 2013-12-26: qty 20

## 2013-12-26 MED ORDER — FENTANYL CITRATE 0.05 MG/ML IJ SOLN
INTRAMUSCULAR | Status: DC | PRN
Start: 2013-12-26 — End: 2013-12-26
  Administered 2013-12-26: 25 ug via INTRAVENOUS
  Administered 2013-12-26 (×2): 50 ug via INTRAVENOUS
  Administered 2013-12-26: 25 ug via INTRAVENOUS
  Administered 2013-12-26: 50 ug via INTRAVENOUS

## 2013-12-26 MED ORDER — CEPHALEXIN 500 MG PO CAPS: 500.0000 mg | ORAL_CAPSULE | Freq: Four times a day (QID) | ORAL | Status: DC

## 2013-12-26 MED ORDER — MIDAZOLAM HCL 5 MG/5ML IJ SOLN
INTRAMUSCULAR | Status: DC | PRN
  Administered 2013-12-26: 2 mg via INTRAVENOUS

## 2013-12-26 MED ORDER — EPINEPHRINE HCL 1 MG/ML IJ SOLN
INTRAMUSCULAR | Status: AC
  Filled 2013-12-26: qty 3

## 2013-12-26 MED ORDER — EPINEPHRINE HCL 1 MG/ML IJ SOLN
INTRAMUSCULAR | Status: AC
  Filled 2013-12-26: qty 4

## 2013-12-26 MED ORDER — FENTANYL CITRATE 0.05 MG/ML IJ SOLN
25.0000 ug | INTRAMUSCULAR | Status: AC
  Administered 2013-12-26 (×2): 25 ug via INTRAVENOUS

## 2013-12-26 MED ORDER — PROPOFOL 10 MG/ML IV BOLUS
INTRAVENOUS | Status: AC
  Filled 2013-12-26: qty 20

## 2013-12-26 MED ORDER — BUPIVACAINE-EPINEPHRINE PF 0.5-1:200000 % IJ SOLN
INTRAMUSCULAR | Status: DC | PRN
  Administered 2013-12-26: 60 mL

## 2013-12-26 MED ORDER — MIDAZOLAM HCL 2 MG/2ML IJ SOLN
1.0000 mg | INTRAMUSCULAR | Status: DC | PRN
  Administered 2013-12-26: 2 mg via INTRAVENOUS

## 2013-12-26 MED ORDER — LIDOCAINE HCL (PF) 1 % IJ SOLN
INTRAMUSCULAR | Status: AC
  Filled 2013-12-26: qty 5

## 2013-12-26 MED ORDER — CEFAZOLIN SODIUM 1-5 GM-% IV SOLN
INTRAVENOUS | Status: AC
  Filled 2013-12-26: qty 100

## 2013-12-26 MED ORDER — LIDOCAINE HCL 1 % IJ SOLN
INTRAMUSCULAR | Status: DC | PRN
  Administered 2013-12-26: 50 mg via INTRADERMAL

## 2013-12-26 MED ORDER — EPINEPHRINE HCL 1 MG/ML IJ SOLN
INTRAMUSCULAR | Status: AC
  Filled 2013-12-26: qty 1

## 2013-12-26 MED ORDER — SODIUM CHLORIDE 0.9 % IR SOLN
Status: DC | PRN
  Administered 2013-12-26 (×6)

## 2013-12-26 MED ORDER — ONDANSETRON HCL 4 MG/2ML IJ SOLN: 4.0000 mg | Freq: Once | INTRAMUSCULAR | Status: DC | PRN

## 2013-12-26 MED ORDER — ONDANSETRON HCL 4 MG/2ML IJ SOLN
4.0000 mg | Freq: Once | INTRAMUSCULAR | Status: AC
  Administered 2013-12-26: 4 mg via INTRAVENOUS
  Filled 2013-12-26: qty 2

## 2013-12-26 MED ORDER — MUPIROCIN 2 % EX OINT
TOPICAL_OINTMENT | CUTANEOUS | Status: DC | PRN
  Administered 2013-12-26: 1 via TOPICAL

## 2013-12-26 MED ORDER — OXYCODONE HCL 5 MG PO TABS
5.0000 mg | ORAL_TABLET | Freq: Once | ORAL | Status: AC
  Administered 2013-12-26: 5 mg via ORAL
  Filled 2013-12-26: qty 1

## 2013-12-26 MED ORDER — LACTATED RINGERS IV SOLN
INTRAVENOUS | Status: DC
  Administered 2013-12-26: 12:00:00 via INTRAVENOUS

## 2013-12-26 MED ORDER — OXYCODONE-ACETAMINOPHEN 5-325 MG PO TABS: 1.0000 | ORAL_TABLET | ORAL | Status: DC | PRN

## 2013-12-26 MED ORDER — PROPOFOL 10 MG/ML IV BOLUS
INTRAVENOUS | Status: DC | PRN
  Administered 2013-12-26: 170 mg via INTRAVENOUS

## 2013-12-26 MED ORDER — MUPIROCIN 2 % EX OINT
TOPICAL_OINTMENT | CUTANEOUS | Status: AC
  Filled 2013-12-26: qty 22

## 2013-12-26 MED ORDER — CEFAZOLIN SODIUM-DEXTROSE 2-3 GM-% IV SOLR
2.0000 g | INTRAVENOUS | Status: AC
  Administered 2013-12-26: 2 g via INTRAVENOUS

## 2013-12-26 SURGICAL SUPPLY — 61 items
ARTHROWAND PARAGON T2 (SURGICAL WAND)
BAG HAMPER (MISCELLANEOUS) ×4 IMPLANT
BANDAGE CONFORM 2  STR LF (GAUZE/BANDAGES/DRESSINGS) ×4 IMPLANT
BANDAGE ELASTIC 2 VELCRO NS LF (GAUZE/BANDAGES/DRESSINGS) ×4 IMPLANT
BANDAGE ELASTIC 6 VELCRO NS (GAUZE/BANDAGES/DRESSINGS) ×4 IMPLANT
BLADE AGGRESSIVE PLUS 4.0 (BLADE) ×4 IMPLANT
BLADE SURG SZ11 CARB STEEL (BLADE) ×4 IMPLANT
CHLORAPREP W/TINT 26ML (MISCELLANEOUS) ×8 IMPLANT
CLOTH BEACON ORANGE TIMEOUT ST (SAFETY) ×8 IMPLANT
COVER PROBE W GEL 5X96 (DRAPES) IMPLANT
CUFF TOURNIQUET SINGLE 34IN LL (TOURNIQUET CUFF) ×4 IMPLANT
CUTTER ANGLED DBL BITE 4.5 (BURR) IMPLANT
CUTTER TOMCAT 5.0MM (BURR) ×4 IMPLANT
DECANTER SPIKE VIAL GLASS SM (MISCELLANEOUS) ×8 IMPLANT
GAUZE SPONGE 4X4 16PLY XRAY LF (GAUZE/BANDAGES/DRESSINGS) ×4 IMPLANT
GAUZE XEROFORM 5X9 LF (GAUZE/BANDAGES/DRESSINGS) ×4 IMPLANT
GLOVE BIOGEL PI IND STRL 6.5 (GLOVE) ×2 IMPLANT
GLOVE BIOGEL PI IND STRL 7.0 (GLOVE) ×4 IMPLANT
GLOVE BIOGEL PI INDICATOR 6.5 (GLOVE) ×2
GLOVE BIOGEL PI INDICATOR 7.0 (GLOVE) ×4
GLOVE ECLIPSE 6.5 STRL STRAW (GLOVE) ×4 IMPLANT
GLOVE OPTIFIT SS 6.5 STRL BRWN (GLOVE) ×8 IMPLANT
GLOVE SKINSENSE NS SZ8.0 LF (GLOVE) ×4
GLOVE SKINSENSE STRL SZ8.0 LF (GLOVE) ×4 IMPLANT
GLOVE SS N UNI LF 8.5 STRL (GLOVE) ×8 IMPLANT
GOWN SPEC L3 XXLG W/TWL (GOWN DISPOSABLE) ×8 IMPLANT
GOWN STRL REUS W/ TWL XL LVL3 (GOWN DISPOSABLE) ×6 IMPLANT
GOWN STRL REUS W/TWL XL LVL3 (GOWN DISPOSABLE) ×6
HLDR LEG FOAM (MISCELLANEOUS) ×2 IMPLANT
IMMOBILIZER KNEE 19 UNV (ORTHOPEDIC SUPPLIES) ×4 IMPLANT
IV NS IRRIG 3000ML ARTHROMATIC (IV SOLUTION) ×24 IMPLANT
KIT BLADEGUARD II DBL (SET/KITS/TRAYS/PACK) ×4 IMPLANT
KIT ROOM TURNOVER AP CYSTO (KITS) ×4 IMPLANT
KIT ROOM TURNOVER APOR (KITS) ×4 IMPLANT
LEG HOLDER FOAM (MISCELLANEOUS) ×2
MANIFOLD NEPTUNE II (INSTRUMENTS) ×4 IMPLANT
MARKER SKIN DUAL TIP RULER LAB (MISCELLANEOUS) ×4 IMPLANT
NEEDLE HYPO 18GX1.5 BLUNT FILL (NEEDLE) ×4 IMPLANT
NEEDLE HYPO 21X1.5 SAFETY (NEEDLE) ×4 IMPLANT
NEEDLE SPNL 18GX3.5 QUINCKE PK (NEEDLE) ×4 IMPLANT
NS IRRIG 1000ML POUR BTL (IV SOLUTION) ×4 IMPLANT
PACK ARTHRO LIMB DRAPE STRL (MISCELLANEOUS) ×4 IMPLANT
PACK BASIC LIMB (CUSTOM PROCEDURE TRAY) ×4 IMPLANT
PAD ABD 5X9 TENDERSORB (GAUZE/BANDAGES/DRESSINGS) ×4 IMPLANT
PAD ABD 8X10 STRL (GAUZE/BANDAGES/DRESSINGS) ×4 IMPLANT
PAD ARMBOARD 7.5X6 YLW CONV (MISCELLANEOUS) ×4 IMPLANT
PADDING CAST COTTON 6X4 STRL (CAST SUPPLIES) ×4 IMPLANT
PADDING WEBRIL 6 STERILE (GAUZE/BANDAGES/DRESSINGS) ×4 IMPLANT
SET ARTHROSCOPY INST (INSTRUMENTS) ×4 IMPLANT
SET ARTHROSCOPY PUMP TUBE (IRRIGATION / IRRIGATOR) ×4 IMPLANT
SET BASIN LINEN APH (SET/KITS/TRAYS/PACK) ×4 IMPLANT
SPONGE GAUZE 4X4 12PLY (GAUZE/BANDAGES/DRESSINGS) ×8 IMPLANT
SUT ETHILON 3 0 FSL (SUTURE) IMPLANT
SWAB CULTURE LIQ STUART DBL (MISCELLANEOUS) ×4 IMPLANT
SYR 30ML LL (SYRINGE) ×4 IMPLANT
SYRINGE 10CC LL (SYRINGE) ×4 IMPLANT
TUBE ANAEROBIC PORT A CUL  W/M (MISCELLANEOUS) ×4 IMPLANT
WAND 50 DEG COVAC W/CORD (SURGICAL WAND) IMPLANT
WAND 90 DEG TURBOVAC W/CORD (SURGICAL WAND) IMPLANT
WAND ARTHRO PARAGON T2 (SURGICAL WAND) IMPLANT
YANKAUER SUCT BULB TIP 10FT TU (MISCELLANEOUS) ×12 IMPLANT

## 2013-12-26 NOTE — Anesthesia Preprocedure Evaluation (Signed)
Anesthesia Evaluation  Patient identified by MRN, date of birth, ID band Patient awake    Reviewed: Allergy & Precautions, H&P , NPO status , Patient's Chart, lab work & pertinent test results, reviewed documented beta blocker date and time   Airway Mallampati: II TM Distance: >3 FB Neck ROM: Full    Dental  (+) Partial Lower and Partial Upper   Pulmonary neg pulmonary ROS,  breath sounds clear to auscultation        Cardiovascular hypertension, Pt. on medications and Pt. on home beta blockers Rhythm:Regular Rate:Normal     Neuro/Psych    GI/Hepatic negative GI ROS,   Endo/Other  diabetes, Type 2, Oral Hypoglycemic Agents  Renal/GU Renal InsufficiencyRenal disease     Musculoskeletal   Abdominal   Peds  Hematology   Anesthesia Other Findings   Reproductive/Obstetrics                           Anesthesia Physical Anesthesia Plan  ASA: III  Anesthesia Plan: General   Post-op Pain Management:    Induction: Intravenous  Airway Management Planned: LMA  Additional Equipment:   Intra-op Plan:   Post-operative Plan: Extubation in OR  Informed Consent: I have reviewed the patients History and Physical, chart, labs and discussed the procedure including the risks, benefits and alternatives for the proposed anesthesia with the patient or authorized representative who has indicated his/her understanding and acceptance.     Plan Discussed with:   Anesthesia Plan Comments:         Anesthesia Quick Evaluation

## 2013-12-26 NOTE — Telephone Encounter (Signed)
On 12/25/13, per notes from Carmela Hurt, Clinic Site Manager, calls made to [facility] [phone], to local representative Laurita Quint, and to [facility], ph# [phone], Ext [phone], Mliss Sax.   Fax communication including [employer] form, sent 12/25/13 as advised.  Per Corena Pilgrim representative, ph# [phone],   no pre-authorization required for surgery at [facility],                                                                       date:01/08/2014, for CPT codes [phone], [phone], ICD9 711.06; Reference [id number].  - Additional notes entered by Remo Lipps indicate request for additional clinicals when available; - Per representative Laurita Quint, approved to "move forward with surgery."

## 2013-12-26 NOTE — Transfer of Care (Signed)
Immediate Anesthesia Transfer of Care Note  Patient: Amanda Krueger  Procedure(s) Performed: Procedure(s): ARTHROSCOPY KNEE (Left) EXAM UNDER ANESTHESIA WITH MANIPULATION OF KNEE (Left) EXTENSIVE SYNOVECTOMY LEFT KNEE (Left) LEFT GREAT TOE CALLOUS DEBRIDEMENT (Left)  Patient Location: PACU  Anesthesia Type:General  Level of Consciousness: awake and patient cooperative  Airway & Oxygen Therapy: Patient Spontanous Breathing and Patient connected to face mask oxygen  Post-op Assessment: Report given to PACU RN, Post -op Vital signs reviewed and stable and Patient moving all extremities  Post vital signs: Reviewed and stable  Complications: No apparent anesthesia complications

## 2013-12-26 NOTE — H&P (Signed)
Amanda Krueger is an 67 y.o. female.   Chief Complaint:  Subjective:     Patient ID: [name], female    DOB: 01-15-1946, 68 y.o.   MRN: [id number]    Chief Complaint   Patient presents with   .  Knee Pain       Left knee pain, no injury. Referred by Dr. Buelah Manis.     Knee Pain     68 year old female presents with a long history of problems in her left knee for over 7 years. She got 2 cortisone injections and then read an article about placing soap around her painful area and at that and was okay until about 3 weeks ago when she experienced sudden onset of pain and stiffness in her left knee. The pain is 10 out of 10 it's constant she's lost the ability to flex and extend the knee and she's been using a walker a cane and a wheelchair to ambulate she denies fever, nausea vomiting. Constipation noted. Swelling noted. He does complain of chills at times other systems are negative.  She has a history of lumbar disc disease at one point was going to have surgery and then it was felt that it wouldn't help her. She eventually recovered from that that workup was 15 years ago  She is diabetic she had blood tests for gout normal is surgery on the great toe.  Medications are hydrochlorothiazide lisinopril, metoprolol, diclofenac, Lantus, tramadol. Family history heart disease arthritis cancer diabetes  Social history disabled and divorced does not smoke or drink    Past Medical History  Diagnosis Date  . Diabetes mellitus   . Hypertension   . DJD (degenerative joint disease)   . Hyperlipidemia     Past Surgical History  Procedure Laterality Date  . Right toe      Great toe  . Cataract extraction, bilateral      right eye-2007, left eye-2012    Family History  Problem Relation Age of Onset  . Diabetes Mother   . Stroke Father   . Heart disease Father   . Diabetes Sister   . Diabetes Brother   . Diabetes Sister    Social History:  reports that she has never smoked. She  does not have any smokeless tobacco history on file. She reports that she does not drink alcohol or use illicit drugs.  Allergies: No Known Allergies  No prescriptions prior to admission    Results for orders placed during the hospital encounter of 12/25/13 (from the past 48 hour(s))  CBC     Status: Abnormal   Collection Time    12/25/13  4:10 PM      Result Value Range   WBC 11.0 (*) 4.0 - 10.5 K/uL   RBC 3.64 (*) 3.87 - 5.11 MIL/uL   Hemoglobin 9.8 (*) 12.0 - 15.0 g/dL   HCT 30.5 (*) 36.0 - 46.0 %   MCV 83.8  78.0 - 100.0 fL   MCH 26.9  26.0 - 34.0 pg   MCHC 32.1  30.0 - 36.0 g/dL   RDW 14.6  11.5 - 15.5 %   Platelets 442 (*) 150 - 400 K/uL  BASIC METABOLIC PANEL     Status: Abnormal   Collection Time    12/25/13  4:10 PM      Result Value Range   Sodium 132 (*) 137 - 147 mEq/L   Potassium 4.8  3.7 - 5.3 mEq/L   Chloride 89 (*) 96 - 112  mEq/L   CO2 30  19 - 32 mEq/L   Glucose, Bld 278 (*) 70 - 99 mg/dL   BUN 27 (*) 6 - 23 mg/dL   Creatinine, Ser 1.31 (*) 0.50 - 1.10 mg/dL   Calcium 9.3  8.4 - 10.5 mg/dL   GFR calc non Af Amer 41 (*) >90 mL/min   GFR calc Af Amer 48 (*) >90 mL/min   Comment: (NOTE)     The eGFR has been calculated using the CKD EPI equation.     This calculation has not been validated in all clinical situations.     eGFR's persistently <90 mL/min signify possible Chronic Kidney     Disease.   No results found.  ROS  There were no vitals taken for this visit. Physical Exam  Objective:    Physical Exam  BP 172/93  Ht '5\' 10"'  (1.778 m)  Wt 170 lb (77.111 kg)  BMI 24.39 kg/m2 General appearance is normal, the patient is alert and oriented x3 with normal mood and affect. She is weightbearing on the right none on the left because she can't get the foot down to the ground because he is stuck in flexion Upper extremity exam  The right and left upper extremity:     Inspection and palpation revealed no abnormalities in the upper extremities.     Range of motion is full without contracture.  Motor exam is normal with grade 5 strength.  The joints are fully reduced without subluxation.  There is no atrophy or tremor and muscle tone is normal.  All joints are stable.   Right leg has normal range of motion ligaments are stable motor exam is normal and skin is intact  The left knee is not warm to touch he does have an effusion her knee is at about 90 flexion and extended to about 70 she complains of pain is tender diffusely. He feels stable but the exam is compromised scans intact without a rash or cuts or abrasions muscle tone is normal  Cardiovascular pulses are normal  Sensation is normal  Lymph nodes normal  Normal reflexes  Balance could not be assessed  Assessment/Plan IMPRESSION: 1. Large joint effusion and large Baker's cyst containing air. This may be related to a recent joint aspiration. If there has not been a recent joint aspiration this is worrisome for septic arthritis. 2. Severe tricompartmental degenerative changes. 3. Severely degenerated and torn medial meniscus 4. Intact ligamentous structures   PLAN: SALK DEBRIDEMENT MUA LEFT KNEE   Arther Abbott 12/26/2013, 7:26 AM

## 2013-12-26 NOTE — Brief Op Note (Addendum)
12/26/2013  2:13 PM  PATIENT:  Amanda Krueger  68 y.o. female  PRE-OPERATIVE DIAGNOSIS:  left knee infection  POST-OPERATIVE DIAGNOSIS:  left knee infection,left great toe infection  PROCEDURE:  Procedure(s): ARTHROSCOPY KNEE (Left) EXAM UNDER ANESTHESIA WITH MANIPULATION OF KNEE (Left) EXTENSIVE SYNOVECTOMY LEFT KNEE (Left) LEFT GREAT TOE CALLOUS DEBRIDEMENT (Left)  SURGEON:  Surgeon(s) and Role:    * Carole Civil, MD - Primary  PHYSICIAN ASSISTANT:   ASSISTANTS: none   ANESTHESIA:   general  EBL:  Total I/O In: 700 [I.V.:700] Out: -   BLOOD ADMINISTERED:none  DRAINS: none   LOCAL MEDICATIONS USED:  MARCAINE    and Amount: 60 ml  SPECIMEN:  Source of Specimen:  cultures of the knee joint fluid   DISPOSITION OF SPECIMEN:  microbiology  COUNTS:  YES  TOURNIQUET:    DICTATION: .Dragon Dictation  PLAN OF CARE: Discharge to home after PACU  PATIENT DISPOSITION:  PACU - hemodynamically stable.   Delay start of Pharmacological VTE agent (>24hrs) due to surgical blood loss or risk of bleeding: not applicable  Surgical technique procedure #1  The patient was identified in the preoperative holding area the left knee was marked as a surgical site confirmed chart update was completed. At that time the patient indicated that she had started having drainage from her great toe of the left foot. This was evaluated with the assistance of Dr. Caprice Beaver podiatrist and we felt that a debridement was needed. Consent was changed.  The patient was taken back to the operating room for general anesthesia in the supine position. The left knee was prepped and draped sterilely. Timeout was completed. A lateral portal was established and a diagnostic arthroscopy was performed. The joint had the following findings  Extensive synovitis, bone fragments, cartilage delamination, purulent material, degenerative arthritis grade 2 in the lateral compartment posterior horn medial meniscal  tear, anterior horn lateral meniscal tear.  Through 2 medial portals extensive synovectomy was performed with partial medial meniscectomy debridement and torn lateral meniscus. We started in the suprapatellar pouch and debrided the synovium until we could reestablish the pouch. We then repeated this in the medial lateral gutter and in the notch. We then addressed the lateral meniscus with debridement using a motorized shaver. We then removed a portion of the posterior horn of the medial meniscus using a 50 ArthroCare wand. A stable rim was confirmed and the lateral meniscus and medial meniscus by probe  The knee was then washed extensively using the wash mode of the pump  We then closed the portals with 3-0 nylon suture. We injected knee with Marcaine with epinephrine  Procedure #2 debridement of left great toe callous Operative findings bleeding bed of tissue in the plantar aspect of the great toe  A separate prep and drape and timeout were completed. Sharp dissection was carried out until the bleeding bed of tissue was noted. Dr. Caprice Beaver evaluated the treatment noted to be satisfactory. He recommended muciprin dressing.  This was applied along with sterile bandages and an Ace wrap.   The patient was extubated and taken recovery room in stable condition  Postoperative plan The patient was placed in a knee immobilizer and started on antibiotics for the knee infection. Cultures pending. Weightbearing status is as tolerated on the heel.  The great toe wound be followed by [facility].

## 2013-12-26 NOTE — Interval H&P Note (Signed)
History and Physical Interval Note:  12/26/2013 12:14 PM  Amanda Krueger  has presented today for surgery, with the diagnosis of left knee infection  The various methods of treatment have been discussed with the patient and family. After consideration of risks, benefits and other options for treatment, the patient has consented to  Procedure(s) with comments: ARTHROSCOPY KNEE (Left) - With Debridement EXAM UNDER ANESTHESIA WITH MANIPULATION OF KNEE (Left) as a surgical intervention .  The patient's history has been reviewed, patient examined, no change in status, stable for surgery.  I have reviewed the patient's chart and labs.  Questions were answered to the patient's satisfaction.    Left great toe drainage  Infection  Odor  Chronic callous  [facility] was treating  Started draining last night  Dr Caprice Beaver to evaluate   Arther Abbott

## 2013-12-26 NOTE — Anesthesia Postprocedure Evaluation (Signed)
  Anesthesia Post-op Note  Patient: Amanda Krueger  Procedure(s) Performed: Procedure(s): ARTHROSCOPY KNEE (Left) EXAM UNDER ANESTHESIA WITH MANIPULATION OF KNEE (Left) EXTENSIVE SYNOVECTOMY LEFT KNEE (Left) LEFT GREAT TOE CALLOUS DEBRIDEMENT (Left)  Patient Location: PACU  Anesthesia Type:General  Level of Consciousness: awake, alert , oriented and patient cooperative  Airway and Oxygen Therapy: Patient Spontanous Breathing  Post-op Pain: 3 /10, mild  Post-op Assessment: Post-op Vital signs reviewed, Patient's Cardiovascular Status Stable, Respiratory Function Stable, Patent Airway, No signs of Nausea or vomiting and Pain level controlled  Post-op Vital Signs: Reviewed and stable  Complications: No apparent anesthesia complications

## 2013-12-26 NOTE — Op Note (Signed)
12/26/2013  2:13 PM  PATIENT:  Amanda Krueger  68 y.o. female  PRE-OPERATIVE DIAGNOSIS:  left knee infection  POST-OPERATIVE DIAGNOSIS:  left knee infection,left great toe infection  PROCEDURE:  Procedure(s): ARTHROSCOPY KNEE (Left) EXAM UNDER ANESTHESIA WITH MANIPULATION OF KNEE (Left) EXTENSIVE SYNOVECTOMY LEFT KNEE (Left) LEFT GREAT TOE CALLOUS DEBRIDEMENT (Left)  SURGEON:  Surgeon(s) and Role:    * Carole Civil, MD - Primary  PHYSICIAN ASSISTANT:   ASSISTANTS: none   ANESTHESIA:   general  EBL:  Total I/O In: 700 [I.V.:700] Out: -   BLOOD ADMINISTERED:none  DRAINS: none   LOCAL MEDICATIONS USED:  MARCAINE    and Amount: 60 ml  SPECIMEN:  Source of Specimen:  cultures of the knee joint fluid   DISPOSITION OF SPECIMEN:  microbiology  COUNTS:  YES  TOURNIQUET:    DICTATION: .Dragon Dictation  PLAN OF CARE: Discharge to home after PACU  PATIENT DISPOSITION:  PACU - hemodynamically stable.   Delay start of Pharmacological VTE agent (>24hrs) due to surgical blood loss or risk of bleeding: not applicable  Surgical technique procedure #1  The patient was identified in the preoperative holding area the left knee was marked as a surgical site confirmed chart update was completed. At that time the patient indicated that she had started having drainage from her great toe of the left foot. This was evaluated with the assistance of Dr. Caprice Beaver podiatrist and we felt that a debridement was needed. Consent was changed.  The patient was taken back to the operating room for general anesthesia in the supine position. The left knee was prepped and draped sterilely. Timeout was completed. A lateral portal was established and a diagnostic arthroscopy was performed. The joint had the following findings  Extensive synovitis, bone fragments, cartilage delamination, purulent material, degenerative arthritis grade 2 in the lateral compartment posterior horn medial meniscal  tear, anterior horn lateral meniscal tear.  Through 2 medial portals extensive synovectomy was performed with partial medial meniscectomy debridement and torn lateral meniscus. We started in the suprapatellar pouch and debrided the synovium until we could reestablish the pouch. We then repeated this in the medial lateral gutter and in the notch. We then addressed the lateral meniscus with debridement using a motorized shaver. We then removed a portion of the posterior horn of the medial meniscus using a 50 ArthroCare wand. A stable rim was confirmed and the lateral meniscus and medial meniscus by probe  The knee was then washed extensively using the wash mode of the pump  We then closed the portals with 3-0 nylon suture. We injected knee with Marcaine with epinephrine  Procedure #2 debridement of left great toe callous Operative findings bleeding bed of tissue in the plantar aspect of the great toe  A separate prep and drape and timeout were completed. Sharp dissection was carried out until the bleeding bed of tissue was noted. Dr. Caprice Beaver evaluated the treatment noted to be satisfactory. He recommended muciprin dressing.  This was applied along with sterile bandages and an Ace wrap.   The patient was extubated and taken recovery room in stable condition  Postoperative plan The patient was placed in a knee immobilizer and started on antibiotics for the knee infection. Cultures pending. Weightbearing status is as tolerated on the heel.  The great toe wound be followed by [facility].

## 2013-12-26 NOTE — Progress Notes (Signed)
From OR. [place]. Oriented to place per nurse. Knee immobilizer to left leg applied. Left leg elevated on pillow. Ice bag to left knee.

## 2013-12-29 ENCOUNTER — Ambulatory Visit (INDEPENDENT_AMBULATORY_CARE_PROVIDER_SITE_OTHER): Payer: Medicare HMO | Admitting: Orthopedic Surgery

## 2013-12-29 ENCOUNTER — Encounter (HOSPITAL_COMMUNITY): Payer: Self-pay | Admitting: Orthopedic Surgery

## 2013-12-29 VITALS — BP 183/98 | Ht 70.0 in | Wt 170.0 lb

## 2013-12-29 DIAGNOSIS — M25562 Pain in left knee: Secondary | ICD-10-CM

## 2013-12-29 DIAGNOSIS — M25569 Pain in unspecified knee: Secondary | ICD-10-CM

## 2013-12-29 DIAGNOSIS — Z9889 Other specified postprocedural states: Secondary | ICD-10-CM

## 2013-12-29 LAB — WOUND CULTURE: CULTURE: NO GROWTH

## 2013-12-29 MED ORDER — OXYCODONE-ACETAMINOPHEN 5-325 MG PO TABS: 1.0000 | ORAL_TABLET | ORAL | Status: DC | PRN

## 2013-12-29 NOTE — Progress Notes (Signed)
Patient ID: Amanda Krueger, female   DOB: 1946/07/25, 68 y.o.   MRN: [id number]  Chief Complaint  Patient presents with  . Follow-up    Post op 1 SALK DOS 12/26/13    The patient had arthroscopy of the left knee and extensive synovector me for infection. She grew out strep viridans on the aspiration from the office operative cultures pending  She is on Keflex. She also had debridement of a great toe ulcer on the left great toe.  She has significantly improved in terms of pain relief her wounds look good. We will take her sutures out and she will start therapy and an EEG [place] continue Keflex for a total of 4 weeks  Return in 3-4 weeks for a postop visit #2

## 2013-12-29 NOTE — Patient Instructions (Signed)
Wear brace one more week

## 2013-12-30 NOTE — Telephone Encounter (Signed)
12/30/13 Received call from [employer] 3rd party [place], ph [phone], fax [phone]; per  Ernst Bowler D, verified the information already faxed as noted 12/25/13; requested clinicals (which had not been available at time of emergency visit and surgery schedule); faxed to her attention as per request.

## 2013-12-31 LAB — ANAEROBIC CULTURE

## 2014-01-01 ENCOUNTER — Ambulatory Visit (INDEPENDENT_AMBULATORY_CARE_PROVIDER_SITE_OTHER): Payer: Commercial Managed Care - HMO | Admitting: Podiatrist

## 2014-01-01 ENCOUNTER — Encounter: Payer: Self-pay | Admitting: Podiatrist

## 2014-01-01 ENCOUNTER — Other Ambulatory Visit: Payer: Self-pay | Admitting: Family Medicine

## 2014-01-01 VITALS — BP 186/88 | HR 74 | Temp 99.0°F | Resp 16

## 2014-01-01 DIAGNOSIS — L97509 Non-pressure chronic ulcer of other part of unspecified foot with unspecified severity: Secondary | ICD-10-CM

## 2014-01-01 DIAGNOSIS — Q828 Other specified congenital malformations of skin: Secondary | ICD-10-CM

## 2014-01-01 DIAGNOSIS — B351 Tinea unguium: Secondary | ICD-10-CM

## 2014-01-01 MED ORDER — CADEXOMER IODINE 0.9 % EX GEL: 1.0000 "application " | CUTANEOUS | Status: DC

## 2014-01-01 NOTE — Progress Notes (Signed)
MRN: [id number] Name: Amanda Krueger  Sex: female Age: 68 y.o. DOB: 1946-04-25  Provider: Trudie Buckler P  Allergies: Review of patient's allergies indicates no known allergies.   Chief Complaint  Patient presents with  . Wound Check    1st toe left "Dr Aline Brochure did surgery on my toe, he trimmed it down cause it was infected, and they told me to keep my appt with Dr Valentina Lucks."     HPI: Patient is 68 y.o. female who presents today for follow up of left great toe following an infection and pus.  Dr. Aline Brochure did surgery on her knee and at the time of surgery a debriement was performed on the toe  Patient already had today's appointment with me for follow up on her toe and was told to keep the appointment by Dr. Aline Brochure.  Patient denies any redness, swelling or signs of infection since her recent procedure.    Past Medical History  Diagnosis Date  . Diabetes mellitus   . Hypertension   . DJD (degenerative joint disease)   . Hyperlipidemia        Medication List       This list is accurate as of: 01/01/14 11:59 PM.  Always use your most recent med list.               aspirin 81 MG tablet  Take 81 mg by mouth daily.     celecoxib 200 MG capsule  Commonly known as:  CELEBREX  Take 200 mg by mouth as needed.     cephALEXin 500 MG capsule  Commonly known as:  KEFLEX  Take 1 capsule (500 mg total) by mouth 4 (four) times daily.     fish oil-omega-3 fatty acids 1000 MG capsule  Take 2 g by mouth daily.     insulin aspart 100 UNIT/ML injection  Commonly known as:  novoLOG  Inject 5 Units into the skin 3 (three) times daily with meals. Sliding scale     insulin glargine 100 UNIT/ML injection  Commonly known as:  LANTUS  Inject 20 Units into the skin at bedtime.     lisinopril-hydrochlorothiazide 20-12.5 MG per tablet  Commonly known as:  PRINZIDE,ZESTORETIC  Take 2 tablets by mouth daily.     metoprolol tartrate 25 MG tablet  Commonly known as:  LOPRESSOR  Take  1 tablet (25 mg total) by mouth 2 (two) times daily.     oxyCODONE-acetaminophen 5-325 MG per tablet  Commonly known as:  PERCOCET/ROXICET  Take 1 tablet by mouth every 4 (four) hours as needed for severe pain.     traMADol 50 MG tablet  Commonly known as:  ULTRAM  Take 1 tablet (50 mg total) by mouth every 8 (eight) hours as needed.         Past Surgical History  Procedure Laterality Date  . Right toe      Great toe  . Cataract extraction, bilateral      right eye-2007, left eye-2012  .  rt toe debridement    . Knee arthroscopy Left 12/26/2013    Procedure: ARTHROSCOPY KNEE;  Surgeon: Carole Civil, MD;  Location: [facility];  Service: Orthopedics;  Laterality: Left;  . Exam under anesthesia with manipulation of knee Left 12/26/2013    Procedure: EXAM UNDER ANESTHESIA WITH MANIPULATION OF KNEE;  Surgeon: Carole Civil, MD;  Location: [facility];  Service: Orthopedics;  Laterality: Left;  . Synovectomy Left 12/26/2013    Procedure: EXTENSIVE SYNOVECTOMY LEFT KNEE;  Surgeon: Carole Civil, MD;  Location: [facility];  Service: Orthopedics;  Laterality: Left;  . Wound debridement Left 12/26/2013    Procedure: LEFT GREAT TOE CALLOUS DEBRIDEMENT;  Surgeon: Carole Civil, MD;  Location: [facility];  Service: Orthopedics;  Laterality: Left;       Physical Exam-- paper chart reviewed.  GENERAL APPEARANCE: Alert, conversant. Appropriately groomed. No acute distress.  VASCULAR: Pedal pulses palpable and strong bilateral.  Capillary refill time is immediate to all digits,  Proximal to distal cooling it warm to warm.  Digital hair growth is present bilateral  NEUROLOGIC: sensation is decreased picritically and protectively to 5.07 monofilament at 2/5 sites bilateral.  Light touch is intact bilateral, vibratory sensation decreased bilateral, achilles tendon reflex is intact bilateral.  MUSCULOSKELETAL: unchanged.  Left knee in a brace from her recent surgery.  Foot is rectus and  unchanged. DERMATOLOGIC: left hallux has 3 separate areas where a deep debridement has been performed they each measure 5cm in diameter.  No redness, or pus is present, no active signs of infection are present. Assessment  Ulcer left hallux   Plan Dressing from her recent surgery was removed and the area was cleansed with wound wash.  iodosorb and a dry sterile dressing was applied.  Patient was instructed on continued wound care at home and I will see her back in 2 weeks for follow up.  If any signs of infection or pain are to arise she will call .       Bronson Ing, DPM

## 2014-01-01 NOTE — Patient Instructions (Signed)
Instructions for Wound Care  The most important step to healing a foot wound is to reduce the pressure on your foot - it is extremely important to stay off your foot as much as possible and wear the shoe/boot as instructed.  Cleanse your foot with saline wash or warm soapy water (dial antibacterial soap or similar).  Blot dry.  Apply prescribed medication to your wound and cover with gauze and a bandage.  May hold bandage in place with Coban (self sticky wrap), Ace bandage or tape.  You may find dressing supplies at your local [employer], [employer], drug store or medical supply store.  Your prescribed topical medication is :  Lodosorb Gel (once or twice daily depending on drainage)  If you notice any foul odor, increase in pain, pus, increased swelling, red streaks or generalized redness occurring in your foot or leg-Call our office immediately to be seen.  This may be a sign of a limb or life threatening infection that will need prompt attention.

## 2014-01-19 ENCOUNTER — Ambulatory Visit (INDEPENDENT_AMBULATORY_CARE_PROVIDER_SITE_OTHER): Payer: Medicare HMO | Admitting: Orthopedic Surgery

## 2014-01-19 ENCOUNTER — Encounter: Payer: Self-pay | Admitting: Orthopedic Surgery

## 2014-01-19 VITALS — BP 181/82 | Ht 70.0 in | Wt 170.0 lb

## 2014-01-19 DIAGNOSIS — M009 Pyogenic arthritis, unspecified: Secondary | ICD-10-CM

## 2014-01-19 DIAGNOSIS — Z9889 Other specified postprocedural states: Secondary | ICD-10-CM | POA: Insufficient documentation

## 2014-01-19 NOTE — Patient Instructions (Signed)
Last day of cephalexin is feb 20   Last day of PT will be feb 20

## 2014-01-19 NOTE — Progress Notes (Signed)
Patient ID: Amanda Krueger, female   DOB: 06/03/1946, 68 y.o.   MRN: [id number] Chief Complaint  Patient presents with  . Follow-up    Post op #2, left knee. DOS 12-26-13.   24 days of 42 cephalexin   Knee feels good , looks fine   ROM = 20-80  Continue PT and continue cephalexin   Encounter Diagnoses  Name Primary?  . S/P knee surgery Yes  . Septic arthritis of knee, left

## 2014-01-23 ENCOUNTER — Ambulatory Visit: Payer: Medicare HMO | Admitting: Podiatrist

## 2014-01-28 ENCOUNTER — Encounter: Payer: Self-pay | Admitting: Podiatrist

## 2014-01-28 ENCOUNTER — Ambulatory Visit (INDEPENDENT_AMBULATORY_CARE_PROVIDER_SITE_OTHER): Payer: Commercial Managed Care - HMO | Admitting: Podiatrist

## 2014-01-28 VITALS — BP 175/82 | HR 85 | Resp 16

## 2014-01-28 DIAGNOSIS — L97509 Non-pressure chronic ulcer of other part of unspecified foot with unspecified severity: Secondary | ICD-10-CM

## 2014-01-29 NOTE — Progress Notes (Signed)
Patient presents for recheck of ulcer left hallux.  She states it is doing fine.  Denies any pain or tenderness, denies any drainage to the toe.  Overall she feels it has healed.  Objective.  Excellent appearance of the toe is seen.  Stable appearance and healed ulceration noted.  No redness, swelling or signs of infection are noted.  Significant  Improvement is seen in the ulceration.  No new issues or complaints seen.  Neurovascular status unchanged with pedal pulses strongly palpated and decreased neurological senstion present.  Mildly plantarflexed hallux noted at site of ulceration.  Assessment:  Healed ulceration left hallux  Plan:  Debrided the dryed tissue over the resolved ulceration to reveal intact integument and healed tissue.  She will wear a foam pad on the toe to protect the site.  We will submit paperwork to obtain diabetic shoes for her to prevent another ulceration in the future.

## 2014-02-10 ENCOUNTER — Ambulatory Visit: Payer: Medicare HMO | Admitting: Orthopedic Surgery

## 2014-02-19 ENCOUNTER — Ambulatory Visit: Payer: Medicare HMO | Admitting: Orthopedic Surgery

## 2014-02-20 ENCOUNTER — Other Ambulatory Visit: Payer: Self-pay | Admitting: Family Medicine

## 2014-02-20 NOTE — Telephone Encounter (Signed)
Refill appropriate and filled per protocol. 

## 2014-02-23 ENCOUNTER — Ambulatory Visit (INDEPENDENT_AMBULATORY_CARE_PROVIDER_SITE_OTHER): Payer: Medicare HMO | Admitting: Orthopedic Surgery

## 2014-02-23 ENCOUNTER — Encounter: Payer: Self-pay | Admitting: Orthopedic Surgery

## 2014-02-23 VITALS — BP 176/106 | Ht 70.0 in | Wt 170.0 lb

## 2014-02-23 DIAGNOSIS — M009 Pyogenic arthritis, unspecified: Secondary | ICD-10-CM

## 2014-02-23 DIAGNOSIS — Z9889 Other specified postprocedural states: Secondary | ICD-10-CM

## 2014-02-23 NOTE — Progress Notes (Signed)
Patient ID: Amanda Krueger, female   DOB: Dec 05, 1946, 68 y.o.   MRN: [id number]  Chief Complaint  Patient presents with  . Follow-up    3 week recheck SALK DOS 12/26/13    The patient is reporting that the therapy is making her knee worse she think she can to do therapy on her own. Her pain is much less but not gone. Range of motion is 5 to 90  We will allow her to do her own therapy all see her back in about a month

## 2014-02-23 NOTE — Patient Instructions (Signed)
Home exercises  

## 2014-03-03 ENCOUNTER — Encounter: Payer: Self-pay | Admitting: Family Medicine

## 2014-03-03 ENCOUNTER — Ambulatory Visit (INDEPENDENT_AMBULATORY_CARE_PROVIDER_SITE_OTHER): Payer: Commercial Managed Care - HMO | Admitting: Family Medicine

## 2014-03-03 VITALS — BP 142/78 | HR 78 | Temp 98.0°F | Resp 14 | Ht 69.5 in | Wt 163.0 lb

## 2014-03-03 DIAGNOSIS — N182 Chronic kidney disease, stage 2 (mild): Secondary | ICD-10-CM

## 2014-03-03 DIAGNOSIS — I10 Essential (primary) hypertension: Secondary | ICD-10-CM

## 2014-03-03 DIAGNOSIS — E785 Hyperlipidemia, unspecified: Secondary | ICD-10-CM

## 2014-03-03 DIAGNOSIS — E119 Type 2 diabetes mellitus without complications: Secondary | ICD-10-CM

## 2014-03-03 MED ORDER — OXYCODONE-ACETAMINOPHEN 5-325 MG PO TABS: 1.0000 | ORAL_TABLET | ORAL | Status: DC | PRN

## 2014-03-03 MED ORDER — METOPROLOL SUCCINATE ER 50 MG PO TB24: 50.0000 mg | ORAL_TABLET | Freq: Every day | ORAL | Status: DC

## 2014-03-03 MED ORDER — LISINOPRIL-HYDROCHLOROTHIAZIDE 20-12.5 MG PO TABS: 2.0000 | ORAL_TABLET | Freq: Every day | ORAL | Status: DC

## 2014-03-03 NOTE — Patient Instructions (Addendum)
Please get labs and last note from Dr. Dorris Fetch Metoprolol changed to once a day  Take pain medication ICE Knee, do the exercises Try the crestor- call if this works okay then prescription will be sent F/U 4 months

## 2014-03-04 NOTE — Assessment & Plan Note (Addendum)
Her diabetes is being managed by endocrinology Urine Micro needed Given prevnar 13, will get Pneumovax in 1 year Needs eye appt

## 2014-03-04 NOTE — Assessment & Plan Note (Signed)
She is not taking the metoprolol correctly therefore will change her to extended release her she can take it once a day with her other medications to help with compliance. Her blood pressure is elevated some today. Her goal will be to keep her systolic less than 0000000

## 2014-03-04 NOTE — Assessment & Plan Note (Signed)
I will obtain the labs from her endocrinologist for her chronic kidney disease

## 2014-03-04 NOTE — Progress Notes (Signed)
Patient ID: Amanda Krueger, female   DOB: 06/18/1946, 68 y.o.   MRN: [id number]   Subjective:    Patient ID: Amanda Krueger, female    DOB: 04-Jul-1946, 68 y.o.   MRN: [id number]  Patient presents for 3 month F/U    Patient here to followup chronic medical problems. She has no specific concerns today. She is status post arthroscopic of the left knee and is currently in home physical therapy. She does request a refill on her pain medication. She still having troubles range of motion and occasional swelling but this is being followed by orthopedics appear  Diabetes mellitus she's been followed by endocrinology she was having some mild hypoglycemia therefore her Lantus was just recently adjusted she also had labs done last week and states that her A1c had improved from the 11%  Medications reviewed  She does have difficulty remembering that the metoprolol twice a day and Sunday sure she takes 2 in the morning    Review Of Systems:  GEN- denies fatigue, fever, weight loss,weakness, recent illness HEENT- denies eye drainage, change in vision, nasal discharge, CVS- denies chest pain, palpitations RESP- denies SOB, cough, wheeze ABD- denies N/V, change in stools, abd pain GU- denies dysuria, hematuria, dribbling, incontinence MSK- +joint pain, muscle aches, injury Neuro- denies headache, dizziness, syncope, seizure activity       Objective:    BP 142/78  Pulse 78  Temp(Src) 98 F (36.7 C)  Resp 14  Ht 5' 9.5" (1.765 m)  Wt 163 lb (73.936 kg)  BMI 23.73 kg/m2 GEN- NAD, alert and oriented x3 HEENT- PERRL, EOMI, non injected sclera, pink conjunctiva, MMM, oropharynx clear CVS- RRR, no murmur RESP-CTAB ABD-NABS,soft,NT,ND EXT- No edema Pulses- Radial, DP- 2+ Ulceration healed on Left toe       Assessment & Plan:      Problem List Items Addressed This Visit   Type II or unspecified type diabetes mellitus without mention of complication, not stated as uncontrolled     Her  diabetes is being managed by endocrinology     Relevant Medications      insulin glargine (LANTUS) 100 UNIT/ML injection      rosuvastatin (CRESTOR) 10 MG tablet      lisinopril-hydrochlorothiazide (PRINZIDE,ZESTORETIC) 20-12.5 MG per tablet   Hyperlipidemia     She has stopped her statin drug in the past but states she's taken the Crestor at this time. I will obtain her last set of labs    Relevant Medications      metoprolol succinate (TOPROL-XL) 24 hr tablet   HTN (hypertension) - Primary     She is not taking the metoprolol correctly therefore will change her to extended release her she can take it once a day with her other medications to help with compliance. Her blood pressure is elevated some today. Her goal will be to keep her systolic less than 0000000    CKD (chronic kidney disease) stage 2, GFR 60-89 ml/min     I will obtain the labs from her endocrinologist for her chronic kidney disease       Note: This dictation was prepared with Dragon dictation along with smaller phrase technology. Any transcriptional errors that result from this process are unintentional.

## 2014-03-04 NOTE — Assessment & Plan Note (Signed)
She has stopped her statin drug in the past but states she's taken the Crestor at this time. I will obtain her last set of labs

## 2014-03-19 ENCOUNTER — Ambulatory Visit (INDEPENDENT_AMBULATORY_CARE_PROVIDER_SITE_OTHER): Payer: Self-pay | Admitting: Orthopedic Surgery

## 2014-03-19 VITALS — BP 185/98 | Ht 70.0 in | Wt 170.0 lb

## 2014-03-19 DIAGNOSIS — M009 Pyogenic arthritis, unspecified: Secondary | ICD-10-CM

## 2014-03-19 NOTE — Progress Notes (Signed)
Patient ID: TZIVIA LOIBL, female   DOB: October 27, 1946, 68 y.o.   MRN: [id number]  Chief Complaint  Patient presents with  . Follow-up    3 week recheck left knee  DOS 12/26/13    Status post arthroscopy left knee for septic arthritis. She's regained range of motion 5-95 no pain using a cane right hand was to be discharged from the clinic  I settled on a year followup  Note there are no signs of infection at this time

## 2014-03-19 NOTE — Addendum Note (Signed)
Addended by: Arther Abbott E on: 03/19/2014 12:07 PM   Modules accepted: Level of Service

## 2014-03-27 ENCOUNTER — Ambulatory Visit (INDEPENDENT_AMBULATORY_CARE_PROVIDER_SITE_OTHER): Payer: Commercial Managed Care - HMO | Admitting: Podiatrist

## 2014-03-27 ENCOUNTER — Encounter: Payer: Self-pay | Admitting: Podiatrist

## 2014-03-27 VITALS — BP 166/88 | HR 75 | Resp 18

## 2014-03-27 DIAGNOSIS — E1140 Type 2 diabetes mellitus with diabetic neuropathy, unspecified: Secondary | ICD-10-CM

## 2014-03-27 DIAGNOSIS — E1149 Type 2 diabetes mellitus with other diabetic neurological complication: Secondary | ICD-10-CM

## 2014-03-27 DIAGNOSIS — M2040 Other hammer toe(s) (acquired), unspecified foot: Secondary | ICD-10-CM

## 2014-03-27 DIAGNOSIS — L97509 Non-pressure chronic ulcer of other part of unspecified foot with unspecified severity: Secondary | ICD-10-CM

## 2014-03-27 DIAGNOSIS — E1142 Type 2 diabetes mellitus with diabetic polyneuropathy: Secondary | ICD-10-CM

## 2014-03-27 DIAGNOSIS — L84 Corns and callosities: Secondary | ICD-10-CM

## 2014-03-27 NOTE — Progress Notes (Signed)
My big toe is better and the 3rd toe  Left  is not doing so well on my left and I have a prescription for diabetic shoes  Patient presents for recheck of ulcer left hallux which has healed.. She states it is better and has not opened back up.. She has a callus on the tip of the left third toe which is uncomfortable. Her primary care physician Dr. Earnest Bailey also gave her prescription for diabetic shoes and she would like to look into those today.   Objective. Excellent appearance of the left great toe is seen. Stable appearance and healed ulceration noted. Left third toe distal tip have it has a callus present. Hemorrhagic tissue is also present. Pre-ulcerative in nature. Hammertoe deformity is noted left. Neurological sensation continues to be decreased with palpable symmetric pedal pulses present bilateral.   Mildly plantarflexed hallux noted   Assessment: Healed ulceration left hallux , hammertoe, callus tip left third toe  Plan: Debrided the callused tissue over the tip of the left third toe. She was measured for diabetic shoes today. We will submit a form to her primary care physician for safe step for compliance with Medicare to get the shoes.. I do feel she is at risk for for the ulceration and diabetic shoes are necessary. She has hammertoe contracture, previous ulceration and diabetic neuropathy necessitating the need for the diabetic shoes.        Callus tip of left 3rd toe-- left hallux healed completely-- diabetic shoes today

## 2014-03-27 NOTE — Patient Instructions (Signed)
Diabetes and Foot Care Diabetes may cause you to have problems because of poor blood supply (circulation) to your feet and legs. This may cause the skin on your feet to become thinner, break easier, and heal more slowly. Your skin may become dry, and the skin may peel and crack. You may also have nerve damage in your legs and feet causing decreased feeling in them. You may not notice minor injuries to your feet that could lead to infections or more serious problems. Taking care of your feet is one of the most important things you can do for yourself.  HOME CARE INSTRUCTIONS  Wear shoes at all times, even in the house. Do not go barefoot. Bare feet are easily injured.  Check your feet daily for blisters, cuts, and redness. If you cannot see the bottom of your feet, use a mirror or ask someone for help.  Wash your feet with warm water (do not use hot water) and mild soap. Then pat your feet and the areas between your toes until they are completely dry. Do not soak your feet as this can dry your skin.  Apply a moisturizing lotion or petroleum jelly (that does not contain alcohol and is unscented) to the skin on your feet and to dry, brittle toenails. Do not apply lotion between your toes.  Trim your toenails straight across. Do not dig under them or around the cuticle. File the edges of your nails with an emery board or nail file.  Do not cut corns or calluses or try to remove them with medicine.  Wear clean socks or stockings every day. Make sure they are not too tight. Do not wear knee-high stockings since they may decrease blood flow to your legs.  Wear shoes that fit properly and have enough cushioning. To break in new shoes, wear them for just a few hours a day. This prevents you from injuring your feet. Always look in your shoes before you put them on to be sure there are no objects inside.  Do not cross your legs. This may decrease the blood flow to your feet.  If you find a minor scrape,  cut, or break in the skin on your feet, keep it and the skin around it clean and dry. These areas may be cleansed with mild soap and water. Do not cleanse the area with peroxide, alcohol, or iodine.  When you remove an adhesive bandage, be sure not to damage the skin around it.  If you have a wound, look at it several times a day to make sure it is healing.  Do not use heating pads or hot water bottles. They may burn your skin. If you have lost feeling in your feet or legs, you may not know it is happening until it is too late.  Make sure your health care provider performs a complete foot exam at least annually or more often if you have foot problems. Report any cuts, sores, or bruises to your health care provider immediately. SEEK MEDICAL CARE IF:   You have an injury that is not healing.  You have cuts or breaks in the skin.  You have an ingrown nail.  You notice redness on your legs or feet.  You feel burning or tingling in your legs or feet.  You have pain or cramps in your legs and feet.  Your legs or feet are numb.  Your feet always feel cold. SEEK IMMEDIATE MEDICAL CARE IF:   There is increasing redness,   swelling, or pain in or around a wound.  There is a red line that goes up your leg.  Pus is coming from a wound.  You develop a fever or as directed by your health care provider.  You notice a bad smell coming from an ulcer or wound. Document Released: 12/01/2000 Document Revised: 08/06/2013 Document Reviewed: 05/13/2013 [employer] Patient Information 2014 [employer].  

## 2014-06-27 ENCOUNTER — Other Ambulatory Visit: Payer: Self-pay | Admitting: Family Medicine

## 2014-06-27 DIAGNOSIS — E119 Type 2 diabetes mellitus without complications: Secondary | ICD-10-CM

## 2014-06-27 DIAGNOSIS — N182 Chronic kidney disease, stage 2 (mild): Secondary | ICD-10-CM

## 2014-06-27 DIAGNOSIS — I10 Essential (primary) hypertension: Secondary | ICD-10-CM

## 2014-06-27 DIAGNOSIS — Z79899 Other long term (current) drug therapy: Secondary | ICD-10-CM

## 2014-06-27 DIAGNOSIS — E785 Hyperlipidemia, unspecified: Secondary | ICD-10-CM

## 2014-06-30 ENCOUNTER — Ambulatory Visit: Payer: Commercial Managed Care - HMO

## 2014-06-30 DIAGNOSIS — E119 Type 2 diabetes mellitus without complications: Secondary | ICD-10-CM

## 2014-06-30 DIAGNOSIS — E785 Hyperlipidemia, unspecified: Secondary | ICD-10-CM

## 2014-06-30 DIAGNOSIS — I10 Essential (primary) hypertension: Secondary | ICD-10-CM

## 2014-06-30 DIAGNOSIS — Z79899 Other long term (current) drug therapy: Secondary | ICD-10-CM

## 2014-06-30 DIAGNOSIS — N182 Chronic kidney disease, stage 2 (mild): Secondary | ICD-10-CM

## 2014-06-30 LAB — LIPID PANEL
Cholesterol: 228 mg/dL — ABNORMAL HIGH (ref 0–200)
HDL: 82 mg/dL (ref >39)
LDL Cholesterol: 132 mg/dL — ABNORMAL HIGH (ref 0–99)
TRIGLYCERIDES: 70 mg/dL (ref <150)
Total CHOL/HDL Ratio: 2.8 Ratio
VLDL: 14 mg/dL (ref 0–40)

## 2014-06-30 LAB — CBC WITH DIFFERENTIAL/PLATELET
BASOS ABS: 0 10*3/uL (ref 0.0–0.1)
Basophils Relative: 0 % (ref 0–1)
EOS PCT: 2 % (ref 0–5)
Eosinophils Absolute: 0.1 10*3/uL (ref 0.0–0.7)
HEMATOCRIT: 36 % (ref 36.0–46.0)
HEMOGLOBIN: 12.2 g/dL (ref 12.0–15.0)
LYMPHS PCT: 39 % (ref 12–46)
Lymphs Abs: 2.4 10*3/uL (ref 0.7–4.0)
MCH: 28.2 pg (ref 26.0–34.0)
MCHC: 33.9 g/dL (ref 30.0–36.0)
MCV: 83.3 fL (ref 78.0–100.0)
MONO ABS: 0.4 10*3/uL (ref 0.1–1.0)
MONOS PCT: 6 % (ref 3–12)
NEUTROS ABS: 3.3 10*3/uL (ref 1.7–7.7)
Neutrophils Relative %: 53 % (ref 43–77)
Platelets: 157 10*3/uL (ref 150–400)
RBC: 4.32 MIL/uL (ref 3.87–5.11)
RDW: 16.6 % — AB (ref 11.5–15.5)
WBC: 6.2 10*3/uL (ref 4.0–10.5)

## 2014-07-01 LAB — MICROALBUMIN / CREATININE URINE RATIO
CREATININE, URINE: 77.3 mg/dL
MICROALB UR: 81.12 mg/dL — AB (ref 0.00–1.89)
MICROALB/CREAT RATIO: 1049.4 mg/g — AB (ref 0.0–30.0)

## 2014-07-03 ENCOUNTER — Encounter: Payer: Self-pay | Admitting: Family Medicine

## 2014-07-03 ENCOUNTER — Ambulatory Visit (INDEPENDENT_AMBULATORY_CARE_PROVIDER_SITE_OTHER): Payer: Commercial Managed Care - HMO | Admitting: Family Medicine

## 2014-07-03 VITALS — BP 148/78 | HR 68 | Temp 98.4°F | Resp 14 | Ht 69.0 in | Wt 167.0 lb

## 2014-07-03 DIAGNOSIS — L97521 Non-pressure chronic ulcer of other part of left foot limited to breakdown of skin: Secondary | ICD-10-CM

## 2014-07-03 DIAGNOSIS — I10 Essential (primary) hypertension: Secondary | ICD-10-CM

## 2014-07-03 DIAGNOSIS — N182 Chronic kidney disease, stage 2 (mild): Secondary | ICD-10-CM

## 2014-07-03 DIAGNOSIS — E119 Type 2 diabetes mellitus without complications: Secondary | ICD-10-CM

## 2014-07-03 DIAGNOSIS — L97509 Non-pressure chronic ulcer of other part of unspecified foot with unspecified severity: Secondary | ICD-10-CM

## 2014-07-03 DIAGNOSIS — E785 Hyperlipidemia, unspecified: Secondary | ICD-10-CM

## 2014-07-03 MED ORDER — OXYCODONE-ACETAMINOPHEN 5-325 MG PO TABS: 1.0000 | ORAL_TABLET | ORAL | Status: DC | PRN

## 2014-07-03 NOTE — Assessment & Plan Note (Signed)
Have her take a half a tablet of metoprolol she is unable to tolerate the full dose. She will continue the lisinopril HCTZ despite only taking 25 mg of metoprolol her blood pressure is still very much so improved and her previous visit

## 2014-07-03 NOTE — Progress Notes (Signed)
Patient ID: Amanda Krueger, female   DOB: February 28, 1946, 68 y.o.   MRN: [id number]   Subjective:    Patient ID: Amanda Krueger, female    DOB: 1945/12/30, 68 y.o.   MRN: [id number]  Patient presents for 4 month F/U and Medication management  patient here to follow chronic medical problems. She's history of diabetes mellitus hypertension hyperlipidemia which are all uncontrolled. She's often noncompliant with her medications because she does not like to take them. She's been followed by endocrinology her last A1c was 9.2% in June of 2015. Creatinine was 1.24 She did not bring her meter with her today but states that her morning sugars are 70 to 80s and bedtime 150s. She's supposed to be on NovoLog 5 units postoperative she does not take this. He is not been taking her Crestor and a regular basis. She has been taking her blood pressure meds and only taking one half tablet of the metoprolol because she states it makes her very fatigued and feels funny when she takes the whole thing.  She continues to have left knee pain status post surgery and is not very active. She did request a refill on her pain medications.    Review Of Systems:  GEN- denies fatigue, fever, weight loss,weakness, recent illness HEENT- denies eye drainage, change in vision, nasal discharge, CVS- denies chest pain, palpitations RESP- denies SOB, cough, wheeze ABD- denies N/V, change in stools, abd pain GU- denies dysuria, hematuria, dribbling, incontinence MSK-+ joint pain, muscle aches, injury Neuro- denies headache, dizziness, syncope, seizure activity       Objective:    BP 148/78  Pulse 68  Temp(Src) 98.4 F (36.9 C) (Oral)  Resp 14  Ht 5\' 9"  (1.753 m)  Wt 167 lb (75.751 kg)  BMI 24.65 kg/m2 GEN- NAD, alert and oriented x3 HEENT- PERRL, EOMI, non injected sclera, pink conjunctiva, MMM, oropharynx clear CVS- RRR, no murmur RESP-CTAB EXT- No edema Pulses- Radial, DP- 2+ Skin- thick long nails, scabbed  ulceration of left foot 3rd digit        Assessment & Plan:      Problem List Items Addressed This Visit   None      Note: This dictation was prepared with Dragon dictation along with smaller phrase technology. Any transcriptional errors that result from this process are unintentional.

## 2014-07-03 NOTE — Assessment & Plan Note (Signed)
Followed by podiatry a recently signed off again for diabetic shoes and inserts

## 2014-07-03 NOTE — Assessment & Plan Note (Signed)
Patient agrees to restart her statin drug. I've given her samples of Crestor 5 mg here in the office until she can get her prescription

## 2014-07-03 NOTE — Assessment & Plan Note (Signed)
Renal function noted from June 20 15th labs discussed the importance of keeping her blood pressure under control in her diabetes

## 2014-07-03 NOTE — Patient Instructions (Signed)
Continue taking your lantus 18 units Continue 1/2 tablet of the metoprolol Pain medication refilled F/U 3 months

## 2014-07-03 NOTE — Assessment & Plan Note (Signed)
Diabetes is uncontrolled. She will follow with endocrinology. Her recent sugars are improved however she has not been compliant with her NovoLog or her statin drug. She is on ACE inhibitor for her urine microalbuminURIA

## 2014-08-12 ENCOUNTER — Encounter: Payer: Self-pay | Admitting: Podiatrist

## 2014-08-12 ENCOUNTER — Ambulatory Visit (INDEPENDENT_AMBULATORY_CARE_PROVIDER_SITE_OTHER): Payer: Commercial Managed Care - HMO | Admitting: Podiatrist

## 2014-08-12 VITALS — BP 193/99 | HR 88 | Resp 16

## 2014-08-12 DIAGNOSIS — M2040 Other hammer toe(s) (acquired), unspecified foot: Secondary | ICD-10-CM

## 2014-08-12 DIAGNOSIS — L97509 Non-pressure chronic ulcer of other part of unspecified foot with unspecified severity: Secondary | ICD-10-CM

## 2014-08-12 MED ORDER — CEPHALEXIN 500 MG PO CAPS: 500.0000 mg | ORAL_CAPSULE | Freq: Three times a day (TID) | ORAL | Status: DC

## 2014-08-12 NOTE — Patient Instructions (Signed)
Instructions for Wound Care  The most important step to healing a foot wound is to reduce the pressure on your foot - it is extremely important to stay off your foot as much as possible   Cleanse your foot with saline wash or warm soapy water (dial antibacterial soap or similar).  Blot dry.  Apply prescribed medication to your wound and cover with gauze and a bandage.  May hold bandage in place with Coban (self sticky wrap), Ace bandage or tape.  You may find dressing supplies at your local [employer], [employer], drug store or medical supply store.  Your prescribed topical medication is :  Lodosorb Gel (once or twice daily depending on drainage)  If you notice any foul odor, increase in pain, pus, increased swelling, red streaks or generalized redness occurring in your foot or leg-Call our office immediately to be seen.  This may be a sign of a limb or life threatening infection that will need prompt attention.  Trudie Buckler, [place]  [phone] [facility]

## 2014-08-13 ENCOUNTER — Telehealth: Payer: Self-pay | Admitting: *Deleted

## 2014-08-13 MED ORDER — CEPHALEXIN 500 MG PO CAPS: 500.0000 mg | ORAL_CAPSULE | Freq: Three times a day (TID) | ORAL | Status: DC

## 2014-08-13 NOTE — Telephone Encounter (Signed)
I had an appointment yesterday with Dr. Valentina Lucks.  She was supposed to call in a prescription for an antibiotic.  I don't know where she called it into because [facility] in [place] do not have it.  Please call me back.  I called and informed the patient that the prescription was sent to [place].  I told her I changed it to [place].  She stated, In [place] right?  I told her yes.  She stated thank you.

## 2014-08-14 NOTE — Progress Notes (Signed)
Patient presents for recheck of ulcer left third toe and for a new ulcer on the right second toe. She states that the ulcer on the right second toe just started and it is painful and symptomatic. She has a callus on the tip of the left third toe which is uncomfortable.  Objective. Excellent appearance of the left great toe is seen. Stable appearance and healed ulceration noted. Right second toe and Left third toe distal tip have it has a callus present. Hemorrhagic tissue is also present. Pre-ulcerative in nature friable fragile tissue is present post debridement.. Hammertoe deformity is noted  Neurological sensation continues to be decreased with palpable symmetric pedal pulses present bilateral. Mildly plantarflexed hallux noted   Assessment: Ulceration right second and left third toe with callus  Plan: Debrided the callused tissue over the tip of the left third toe and right second toe. I will see her back for recheck in 2 weeks. At the last visit we did order her diabetic shoes and will be sure to check on those as well.

## 2014-08-26 ENCOUNTER — Ambulatory Visit: Payer: Commercial Managed Care - HMO | Admitting: Podiatrist

## 2014-08-26 ENCOUNTER — Encounter: Payer: Self-pay | Admitting: Podiatrist

## 2014-08-26 ENCOUNTER — Ambulatory Visit (INDEPENDENT_AMBULATORY_CARE_PROVIDER_SITE_OTHER): Payer: Commercial Managed Care - HMO | Admitting: Podiatrist

## 2014-08-26 VITALS — BP 158/68 | HR 72 | Resp 16

## 2014-08-26 DIAGNOSIS — M2040 Other hammer toe(s) (acquired), unspecified foot: Secondary | ICD-10-CM

## 2014-08-26 DIAGNOSIS — L97509 Non-pressure chronic ulcer of other part of unspecified foot with unspecified severity: Secondary | ICD-10-CM

## 2014-08-26 NOTE — Progress Notes (Signed)
Patient presents for recheck of ulcer left third toe and for a new ulcer on the right second toe. She states that the ulcer on the right second toe just started and it is painful and symptomatic. She has a callus on the tip of the left third toe which is uncomfortable.  Objective. Excellent appearance of the left great toe is seen. Stable appearance and healed ulceration noted. Right second toe and Left third toe distal tip have it has a callus present. Hemorrhagic tissue is also present. Pre-ulcerative in nature friable fragile tissue is present post debridement.. Hammertoe deformity is noted Neurological sensation continues to be decreased with palpable symmetric pedal pulses present bilateral. Mildly plantarflexed hallux noted  Assessment: Ulceration right second and left third toe with callus  Plan: Debrided the callused tissue over the tip of the left third toe and right second toe. I will see her back for recheck in 2 weeks. At the last visit we did order her diabetic shoes and will be sure to check on those as well.

## 2014-09-15 ENCOUNTER — Encounter: Payer: Self-pay | Admitting: *Deleted

## 2014-09-15 LAB — BLOOD PRESSURE CHECK BP00
A1c: 11
LDL CALC: 132 mg/dL

## 2014-09-16 ENCOUNTER — Ambulatory Visit: Payer: Commercial Managed Care - HMO | Admitting: Podiatrist

## 2014-10-23 ENCOUNTER — Other Ambulatory Visit: Payer: Self-pay | Admitting: Family Medicine

## 2014-10-23 MED ORDER — DICLOFENAC SODIUM 1 % TD GEL: TRANSDERMAL | Status: DC

## 2015-01-15 ENCOUNTER — Other Ambulatory Visit: Payer: Commercial Managed Care - HMO

## 2015-01-19 ENCOUNTER — Encounter: Payer: Commercial Managed Care - HMO | Admitting: Family Medicine

## 2015-02-16 ENCOUNTER — Other Ambulatory Visit: Payer: Commercial Managed Care - HMO

## 2015-02-19 ENCOUNTER — Encounter: Payer: Self-pay | Admitting: Family Medicine

## 2015-02-19 ENCOUNTER — Ambulatory Visit (INDEPENDENT_AMBULATORY_CARE_PROVIDER_SITE_OTHER): Payer: Commercial Managed Care - HMO | Admitting: Family Medicine

## 2015-02-19 VITALS — BP 144/82 | HR 74 | Temp 98.0°F | Resp 16 | Ht 69.0 in | Wt 176.0 lb

## 2015-02-19 DIAGNOSIS — Z23 Encounter for immunization: Secondary | ICD-10-CM

## 2015-02-19 DIAGNOSIS — E1129 Type 2 diabetes mellitus with other diabetic kidney complication: Secondary | ICD-10-CM | POA: Diagnosis not present

## 2015-02-19 DIAGNOSIS — E1122 Type 2 diabetes mellitus with diabetic chronic kidney disease: Secondary | ICD-10-CM

## 2015-02-19 DIAGNOSIS — I10 Essential (primary) hypertension: Secondary | ICD-10-CM

## 2015-02-19 DIAGNOSIS — N189 Chronic kidney disease, unspecified: Secondary | ICD-10-CM | POA: Diagnosis not present

## 2015-02-19 DIAGNOSIS — N182 Chronic kidney disease, stage 2 (mild): Secondary | ICD-10-CM | POA: Diagnosis not present

## 2015-02-19 DIAGNOSIS — E785 Hyperlipidemia, unspecified: Secondary | ICD-10-CM | POA: Diagnosis not present

## 2015-02-19 DIAGNOSIS — Z0000 Encounter for general adult medical examination without abnormal findings: Secondary | ICD-10-CM

## 2015-02-19 DIAGNOSIS — M7989 Other specified soft tissue disorders: Secondary | ICD-10-CM | POA: Diagnosis not present

## 2015-02-19 DIAGNOSIS — Z124 Encounter for screening for malignant neoplasm of cervix: Secondary | ICD-10-CM | POA: Diagnosis not present

## 2015-02-19 LAB — CBC WITH DIFFERENTIAL/PLATELET
Basophils Absolute: 0.1 10*3/uL (ref 0.0–0.1)
Basophils Relative: 1 % (ref 0–1)
EOS ABS: 0.1 10*3/uL (ref 0.0–0.7)
Eosinophils Relative: 2 % (ref 0–5)
HEMATOCRIT: 39.4 % (ref 36.0–46.0)
HEMOGLOBIN: 13.4 g/dL (ref 12.0–15.0)
LYMPHS ABS: 2.7 10*3/uL (ref 0.7–4.0)
Lymphocytes Relative: 47 % — ABNORMAL HIGH (ref 12–46)
MCH: 29.3 pg (ref 26.0–34.0)
MCHC: 34 g/dL (ref 30.0–36.0)
MCV: 86.2 fL (ref 78.0–100.0)
MONOS PCT: 5 % (ref 3–12)
MPV: 11.3 fL (ref 8.6–12.4)
Monocytes Absolute: 0.3 10*3/uL (ref 0.1–1.0)
NEUTROS PCT: 45 % (ref 43–77)
Neutro Abs: 2.6 10*3/uL (ref 1.7–7.7)
Platelets: 186 10*3/uL (ref 150–400)
RBC: 4.57 MIL/uL (ref 3.87–5.11)
RDW: 14.5 % (ref 11.5–15.5)
WBC: 5.7 10*3/uL (ref 4.0–10.5)

## 2015-02-19 LAB — COMPREHENSIVE METABOLIC PANEL
ALT: 12 U/L (ref 0–35)
AST: 15 U/L (ref 0–37)
Albumin: 3.8 g/dL (ref 3.5–5.2)
Alkaline Phosphatase: 112 U/L (ref 39–117)
BILIRUBIN TOTAL: 0.4 mg/dL (ref 0.2–1.2)
BUN: 29 mg/dL — AB (ref 6–23)
CALCIUM: 9.3 mg/dL (ref 8.4–10.5)
CHLORIDE: 101 meq/L (ref 96–112)
CO2: 30 mEq/L (ref 19–32)
Creat: 1.41 mg/dL — ABNORMAL HIGH (ref 0.50–1.10)
Glucose, Bld: 133 mg/dL — ABNORMAL HIGH (ref 70–99)
Potassium: 4.5 mEq/L (ref 3.5–5.3)
Sodium: 139 mEq/L (ref 135–145)
Total Protein: 7.6 g/dL (ref 6.0–8.3)

## 2015-02-19 LAB — LIPID PANEL
Cholesterol: 276 mg/dL — ABNORMAL HIGH (ref 0–200)
HDL: 97 mg/dL (ref >=46)
LDL Cholesterol: 157 mg/dL — ABNORMAL HIGH (ref 0–99)
TRIGLYCERIDES: 108 mg/dL (ref <150)
Total CHOL/HDL Ratio: 2.8 Ratio
VLDL: 22 mg/dL (ref 0–40)

## 2015-02-19 NOTE — Progress Notes (Signed)
Patient ID: Amanda Krueger, female   DOB: Mar 06, 1946, 69 y.o.   MRN: [id number] Subjective:   Patient presents for Medicare Annual/Subsequent preventive examination.    Pt here for CPE, due for PAP Smear, Mammogram and Colonoscopy.   DM- has not followed up with endocrinology, has not had labs in about 6 months or so, last A1C on record 9.2% she is taking Lantus 20 units, she is not injecting the novolog. Did not bring CBG readings states they fluctuate. Still followed by podiatry  - request new endocrinologist HTN- states taking all BP medications as prescribed and ASA   She is not taking Crestor Review Past Medical/Family/Social: per EMR    Risk Factors  Current exercise habits: DM, HTN, Hyperlipidemia Dietary issues discussed: yes  Cardiac risk factors: same as above  Depression Screen  (Note: if answer to either of the following is "Yes", a more complete depression screening is indicated)  Over the past two weeks, have you felt down, depressed or hopeless? No Over the past two weeks, have you felt little interest or pleasure in doing things? No Have you lost interest or pleasure in daily life? No Do you often feel hopeless? No Do you cry easily over simple problems? No   Activities of Daily Living  In your present state of health, do you have any difficulty performing the following activities?:  Driving? No  Managing money? No  Feeding yourself? No  Getting from bed to chair? No  Climbing a flight of stairs? No  Preparing food and eating?: No  Bathing or showering? No  Getting dressed: No  Getting to the toilet? No  Using the toilet:No  Moving around from place to place: No  In the past year have you fallen or had a near fall?:No  Are you sexually active? No  Do you have more than one partner? No   Hearing Difficulties: No  Do you often ask people to speak up or repeat themselves? No  Do you experience ringing or noises in your ears? No Do you have difficulty  understanding soft or whispered voices? No  Do you feel that you have a problem with memory? No Do you often misplace items? No  Do you feel safe at home? Yes  Cognitive Testing  Alert? Yes Normal Appearance?Yes  Oriented to person? Yes Place? Yes  Time? Yes  Recall of three objects? Yes  Can perform simple calculations? Yes  Displays appropriate judgment?Yes  Can read the correct time from a watch face?Yes   List the Names of Other Physician/Practitioners you currently use: Endocrinology     Screening Tests / Date - all due/declines some shots Colonoscopy                     Zostavax - due declines Mammogram  Influenza Vaccine - overdue Tetanus/tdap  ROS: GEN- denies fatigue, fever, weight loss,weakness, recent illness HEENT- denies eye drainage, change in vision, nasal discharge, CVS- denies chest pain, palpitations RESP- denies SOB, cough, wheeze ABD- denies N/V, change in stools, abd pain GU- denies dysuria, hematuria, dribbling, incontinence MSK- + joint pain, muscle aches, injury Neuro- denies headache, dizziness, syncope, seizure activity   PHYSICAL: GEN- NAD, alert and oriented x3 HEENT- PERRL, EOMI, non injected sclera, pink conjunctiva, MMM, oropharynx clear Neck- Supple, no thryomegaly, no bruit CVS- RRR, no murmur RESP-CTAB Breast- normal symmetry, no nipple inversion,no nipple drainage, no nodules or lumps felt Nodes- no axillary nodes ABD-NABS,soft,NT,ND GU- normal external genitalia, vaginal mucosa  pink and moist, cervix visualized no growth, no blood form os, minimal thin clear discharge, no CMT, no ovarian masses, uterus normal size EXT- 1+ edema Right ankle, foot, no edema left foot Pulses- Radial 2+, DP-diminished    Assessment:    Annual wellness medicare exam   Plan:    During the course of the visit the patient was educated and counseled about appropriate screening and preventive services including:  Screening mammography  Colorectal  cancer screening   Pneumonia vaccine  Diet review for nutrition referral? Yes ____ Not Indicated __x__  Patient Instructions (the written plan) was given to the patient.  Medicare Attestation  I have personally reviewed:  The patient's medical and social history  Their use of alcohol, tobacco or illicit drugs  Their current medications and supplements  The patient's functional ability including ADLs,fall risks, home safety risks, cognitive, and hearing and visual impairment  Diet and physical activities  Evidence for depression or mood disorders  The patient's weight, height, BMI, and visual acuity have been recorded in the chart. I have made referrals, counseling, and provided education to the patient based on review of the above and I have provided the patient with a written personalized care plan for preventive services.

## 2015-02-19 NOTE — Patient Instructions (Addendum)
Schedule your Mammogram We will call with lab results Pneumonia vaccine given  Referral to endocrinologist Return stool cards Get compression hose for your legs Schedule eye doctor appointment F/U 4 months

## 2015-02-20 LAB — HEMOGLOBIN A1C
HEMOGLOBIN A1C: 10.9 % — AB (ref <5.7)
Mean Plasma Glucose: 266 mg/dL — ABNORMAL HIGH (ref <117)

## 2015-02-21 NOTE — Assessment & Plan Note (Signed)
BP elevated today, no meds taken as fasting

## 2015-02-21 NOTE — Assessment & Plan Note (Signed)
Uncontrolled DM, will check labs, refer to new endocrinologist,

## 2015-02-21 NOTE — Assessment & Plan Note (Signed)
Chronic right ankle swelling, she lost compression hose, new script given

## 2015-02-22 LAB — PAP THINPREP ASCUS RFLX HPV RFLX TYPE

## 2015-02-23 ENCOUNTER — Other Ambulatory Visit: Payer: Self-pay | Admitting: Family Medicine

## 2015-02-23 ENCOUNTER — Telehealth: Payer: Self-pay | Admitting: Family Medicine

## 2015-02-23 MED ORDER — INSULIN GLARGINE 100 UNIT/ML ~~LOC~~ SOLN: SUBCUTANEOUS | Status: DC

## 2015-02-23 NOTE — Telephone Encounter (Signed)
Duplicate request

## 2015-02-23 NOTE — Telephone Encounter (Signed)
[facility] [place]   Patient calling for refill on her lantus  [phone] if any questions

## 2015-02-23 NOTE — Telephone Encounter (Signed)
Prescription sent to pharmacy.

## 2015-02-24 ENCOUNTER — Telehealth: Payer: Self-pay | Admitting: Family Medicine

## 2015-02-24 NOTE — Telephone Encounter (Signed)
[phone] PT is needing a referral to Amanda Krueger about a callus on her foot. If any questions please contact the pt

## 2015-02-25 NOTE — Telephone Encounter (Signed)
lmtrc

## 2015-03-01 ENCOUNTER — Other Ambulatory Visit: Payer: Self-pay | Admitting: Family Medicine

## 2015-03-01 DIAGNOSIS — L84 Corns and callosities: Secondary | ICD-10-CM

## 2015-03-01 NOTE — Telephone Encounter (Signed)
OKAY TO PROCEED WITH REFERRAL

## 2015-03-01 NOTE — Telephone Encounter (Signed)
Pt called back and stated she is having some issue has before on her feet with the callus and wanted to know if we can refer her to [facility], order has been placed.  Just wanted you to be aware and make sure you are ok with the referral?

## 2015-03-04 ENCOUNTER — Telehealth: Payer: Self-pay | Admitting: *Deleted

## 2015-03-04 NOTE — Telephone Encounter (Signed)
Submitted [employer] referral thru acuity connect for authorization for authorization to Philemon Kingdom, MD endocrinologist with authorization [id number]  Requesting provider: Neysa Hotter  Treating provider: Gerri Lins  Number of visits:6  Start Date:03/15/15  End Date:09/11/15  Dx: E11.22- type 2 diabetes mellitus w diabetic chronic kidney dz       N18.9- chronic kidney dz,unspecified  Copy has been faxed to Dr. Philemon Kingdom, MD

## 2015-03-04 NOTE — Telephone Encounter (Signed)
Submitted [employer] referral thru acuity connect for authorization to Trudie Buckler, MD podiatrist with authorization [id number]  Requesting provider: Vic Blackbird, MD  Treating provider: Trudie Buckler, MD  Number of visits:6  Start Date: 03/25/15  End Date:09/21/15  Dx: L84-Corns and callosites  Copy has been faxed to podiatry

## 2015-03-15 ENCOUNTER — Ambulatory Visit (INDEPENDENT_AMBULATORY_CARE_PROVIDER_SITE_OTHER): Payer: Commercial Managed Care - HMO | Admitting: Internal Medicine

## 2015-03-15 ENCOUNTER — Encounter: Payer: Self-pay | Admitting: Internal Medicine

## 2015-03-15 VITALS — BP 124/82 | HR 64 | Temp 97.4°F | Resp 12 | Ht 69.0 in | Wt 180.0 lb

## 2015-03-15 DIAGNOSIS — N189 Chronic kidney disease, unspecified: Secondary | ICD-10-CM

## 2015-03-15 DIAGNOSIS — E1122 Type 2 diabetes mellitus with diabetic chronic kidney disease: Secondary | ICD-10-CM

## 2015-03-15 MED ORDER — INSULIN REGULAR HUMAN 100 UNIT/ML IJ SOLN: 4.0000 [IU] | Freq: Three times a day (TID) | INTRAMUSCULAR | Status: DC

## 2015-03-15 MED ORDER — INSULIN GLARGINE 100 UNIT/ML ~~LOC~~ SOLN: SUBCUTANEOUS | Status: DC

## 2015-03-15 NOTE — Progress Notes (Signed)
Patient ID: Amanda Krueger, female   DOB: 12/20/1945, 69 y.o.   MRN: [id number]  HPI: MARCELA Krueger is a 69 y.o.-year-old female, referred by her PCP, Dr. Buelah Manis, for management of DM2, dx in 1990s, insulin-dependent since the 1990s, uncontrolled, with complications (CKD).  Last hemoglobin A1c was: Lab Results  Component Value Date   HGBA1C 10.9* 02/19/2015   HGBA1C 11.0* 11/07/2013   HGBA1C 11.3* 03/01/2012  She had knee sx in 01/2014 >> since then, she has a lot of pain. She also lost weight 20 lbs. She is unhappy with her weight, would like to gain weight back.  Pt is on a regimen of: - Lantus 20 units at bedtime She is not taking the prescribed Novolog 5 units 3x a day, before meals. She stopped this 6-7 mo ago as she got discouraged b/c she was still needing insulin despite her weight loss. She also c/o the Novolog price being too high.  Pt checks her sugars 1x a day and they are: - am: 150-170  - 2h after b'fast: n/c - before lunch: n/c - 2h after lunch: can have 300s - before dinner: n/c - 2h after dinner: n/c - bedtime: n/c - nighttime: n/c No lows. Lowest sugar was 67; she has hypoglycemia awareness at 70.  Highest sugar was 300s.  Glucometer: AccuChek  Pt's meals are: - Breakfast: [place] bacon; egg; oatmeal; toast and coffee - Lunch: spaghetti, soup, salad - Dinner: chicken, [place], [employer] research officer, government. - Snacks: 2-3  - + CKD, last BUN/creatinine:  Lab Results  Component Value Date   BUN 29* 02/19/2015   CREATININE 1.41* 02/19/2015   - last set of lipids: Lab Results  Component Value Date   CHOL 276* 02/19/2015   HDL 97 02/19/2015   LDLCALC 157* 02/19/2015   TRIG 108 02/19/2015   CHOLHDL 2.8 02/19/2015  On Crestor 10. - last eye exam was in 2013. Had cataract Sx OU. ? DR.  - + numbness and tingling in her feet. + calluses on feet. Has a podiatrist.  Pt has FH of DM in mother, sisters, brother.  Also has a h/o HTN, HL, DDD - back.  ROS: Constitutional: + weight  loss, no fatigue, no subjective hyperthermia/hypothermia, + excessive urination and nocturia Eyes: no blurry vision, no xerophthalmia ENT: no sore throat, no nodules palpated in throat, no dysphagia/odynophagia, no hoarseness, + tinnitus Cardiovascular: no CP/SOB/palpitations/+ feet swelling (relieved by eating cherries) Respiratory: no cough/SOB Gastrointestinal: no N/V/D/C Musculoskeletal: no muscle/+ joint aches Skin: no rashes Neurological: no tremors/numbness/tingling/dizziness Psychiatric: no depression/anxiety  Past Medical History  Diagnosis Date  . Diabetes mellitus   . Hypertension   . DJD (degenerative joint disease)   . Hyperlipidemia    Past Surgical History  Procedure Laterality Date  . Right toe      Great toe  . Cataract extraction, bilateral      right eye-2007, left eye-2012  .  rt toe debridement    . Knee arthroscopy Left 12/26/2013    Procedure: ARTHROSCOPY KNEE;  Surgeon: Carole Civil, MD;  Location: [facility];  Service: Orthopedics;  Laterality: Left;  . Exam under anesthesia with manipulation of knee Left 12/26/2013    Procedure: EXAM UNDER ANESTHESIA WITH MANIPULATION OF KNEE;  Surgeon: Carole Civil, MD;  Location: [facility];  Service: Orthopedics;  Laterality: Left;  . Synovectomy Left 12/26/2013    Procedure: EXTENSIVE SYNOVECTOMY LEFT KNEE;  Surgeon: Carole Civil, MD;  Location: [facility];  Service: Orthopedics;  Laterality: Left;  .  Wound debridement Left 12/26/2013    Procedure: LEFT GREAT TOE CALLOUS DEBRIDEMENT;  Surgeon: Carole Civil, MD;  Location: [facility];  Service: Orthopedics;  Laterality: Left;   History   Social History  . Marital Status: Divorced    Spouse Name: N/A  . Number of Children: 3   Occupational History  . Retired - disability.   Social History Main Topics  . Smoking status: Never Smoker   . Smokeless tobacco: Not on file  . Alcohol Use: No  . Drug Use: No  . Sexual Activity: Not on file   Current Outpatient  Prescriptions on File Prior to Visit  Medication Sig Dispense Refill  . ACCU-CHEK FASTCLIX LANCETS MISC     . ACCU-CHEK SMARTVIEW test strip     . Blood Glucose Monitoring Suppl (ACCU-CHEK NANO SMARTVIEW) W/DEVICE KIT     . diclofenac sodium (VOLTAREN) 1 % GEL Apply dime size  three times a day as needed, to affected areas 1 Tube 3  . fish oil-omega-3 fatty acids 1000 MG capsule Take 2 g by mouth daily.    . insulin glargine (LANTUS) 100 UNIT/ML injection 20 units at bedtime 10 mL 11  . lisinopril-hydrochlorothiazide (PRINZIDE,ZESTORETIC) 20-12.5 MG per tablet Take 2 tablets by mouth daily. 180 tablet 1  . metoprolol succinate (TOPROL-XL) 50 MG 24 hr tablet Take 1 tablet (50 mg total) by mouth daily. Take with or immediately following a meal. 90 tablet 3  . rosuvastatin (CRESTOR) 10 MG tablet Take 10 mg by mouth daily.    Marland Kitchen aspirin 81 MG tablet Take 81 mg by mouth daily.    . insulin aspart (NOVOLOG) 100 UNIT/ML injection Inject 5 Units into the skin 3 (three) times daily with meals. Sliding scale (Patient not taking: Reported on 02/19/2015) 1 vial 3   No current facility-administered medications on file prior to visit.   No Known Allergies Family History  Problem Relation Age of Onset  . Diabetes Mother   . Stroke Father   . Heart disease Father   . Diabetes Sister   . Diabetes Brother   . Diabetes Sister    PE: BP 124/82 mmHg  Pulse 64  Temp(Src) 97.4 F (36.3 C) (Oral)  Resp 12  Ht '5\' 9"'  (1.753 m)  Wt 180 lb (81.647 kg)  BMI 26.57 kg/m2  SpO2 98% Wt Readings from Last 3 Encounters:  03/15/15 180 lb (81.647 kg)  02/19/15 176 lb (79.833 kg)  07/03/14 167 lb (75.751 kg)   Constitutional: slightly overweight, in NAD Eyes: PERRLA, EOMI, no exophthalmos ENT: moist mucous membranes, no thyromegaly, no cervical lymphadenopathy Cardiovascular: RRR, No MRG; + L periankle swelling Respiratory: CTA B Gastrointestinal: abdomen soft, NT, ND, BS+ Musculoskeletal: no deformities,  strength intact in all 4 Skin: moist, warm, no rashes Neurological: no tremor with outstretched hands, DTR normal in all 4  ASSESSMENT: 1. DM2, insulin-dependent, uncontrolled, with complications - CKD  PLAN:  1. Patient with long-standing, uncontrolled diabetes, on only long acting insulin, which is inefficient. I explained that I believe that she may have developed DM1 (LADA) so she still requires insulin despite the weight loss. She has a staircase effect in her sugars: increasing throughout the day, a sign that she requires mealtime coverage. We discussed to start Regular insulin instead of NovoLog 2/2 price >> she agrees. Since she is dropping her sugars signifficantly overnight, will decrease Lantus to 18 units. - I suggested to:  Patient Instructions  Please decrease the Lantus dose to 18 units  at bedtime.  Please start Regular insulin 30 min before every main meal (3x a day): - 4 units before a smaller meal - 6 units before a larger meal  Please return in 1 month with your sugar log.   - Strongly advised her to start checking sugars at different times of the day - check 2-3 times a day, rotating checks - given sugar log and advised how to fill it and to bring it at next appt  - given foot care handout and explained the principles  - given instructions for hypoglycemia management "15-15 rule"  - advised for yearly eye exams >> she needs one! - I offered to refer her to nutrition, but she refuses for now - Return to clinic in 1 mo with sugar log

## 2015-03-15 NOTE — Patient Instructions (Signed)
Please decrease the Lantus dose to 18 units at bedtime.  Please start Regular insulin 30 min before every main meal (3x a day): - 4 units before a smaller meal - 6 units before a larger meal  Please return in 1 month with your sugar log.   PATIENT INSTRUCTIONS FOR TYPE 2 DIABETES:  **Please join MyChart!** - see attached instructions about how to join if you have not done so already.  DIET AND EXERCISE Diet and exercise is an important part of diabetic treatment.  We recommended aerobic exercise in the form of brisk walking (working between 40-60% of maximal aerobic capacity, similar to brisk walking) for 150 minutes per week (such as 30 minutes five days per week) along with 3 times per week performing 'resistance' training (using various gauge rubber tubes with handles) 5-10 exercises involving the major muscle groups (upper body, lower body and core) performing 10-15 repetitions (or near fatigue) each exercise. Start at half the above goal but build slowly to reach the above goals. If limited by weight, joint pain, or disability, we recommend daily walking in a swimming pool with water up to waist to reduce pressure from joints while allow for adequate exercise.    BLOOD GLUCOSES Monitoring your blood glucoses is important for continued management of your diabetes. Please check your blood glucoses 2-4 times a day: fasting, before meals and at bedtime (you can rotate these measurements - e.g. one day check before the 3 meals, the next day check before 2 of the meals and before bedtime, etc.).   HYPOGLYCEMIA (low blood sugar) Hypoglycemia is usually a reaction to not eating, exercising, or taking too much insulin/ other diabetes drugs.  Symptoms include tremors, sweating, hunger, confusion, headache, etc. Treat IMMEDIATELY with 15 grams of Carbs: . 4 glucose tablets .  cup regular juice/soda . 2 tablespoons raisins . 4 teaspoons sugar . 1 tablespoon honey Recheck blood glucose in 15  mins and repeat above if still symptomatic/blood glucose <100.  RECOMMENDATIONS TO REDUCE YOUR RISK OF DIABETIC COMPLICATIONS: * Take your prescribed MEDICATION(S) * Follow a DIABETIC diet: Complex carbs, fiber rich foods, (monounsaturated and polyunsaturated) fats * AVOID saturated/trans fats, high fat foods, >2,300 mg salt per day. * EXERCISE at least 5 times a week for 30 minutes or preferably daily.  * DO NOT SMOKE OR DRINK more than 1 drink a day. * Check your FEET every day. Do not wear tightfitting shoes. Contact us if you develop an ulcer * See your EYE doctor once a year or more if needed * Get a FLU shot once a year * Get a PNEUMONIA vaccine once before and once after age 64 years  GOALS:  * Your Hemoglobin A1c of <7%  * fasting sugars need to be <130 * after meals sugars need to be <180 (2h after you start eating) * Your Systolic BP should be [id number] or lower  * Your Diastolic BP should be 80 or lower  * Your HDL (Good Cholesterol) should be 40 or higher  * Your LDL (Bad Cholesterol) should be 100 or lower. * Your Triglycerides should be 150 or lower  * Your Urine microalbumin (kidney function) should be <30 * Your Body Mass Index should be 25 or lower    Please consider the following ways to cut down carbs and fat and increase fiber and micronutrients in your diet: - substitute whole grain for white bread or pasta - substitute brown rice for white rice - substitute 90-calorie flat bread  pieces for slices of bread when possible - substitute sweet potatoes or yams for white potatoes - substitute humus for margarine - substitute tofu for cheese when possible - substitute almond or rice milk for regular milk (would not drink soy milk daily due to concern for soy estrogen influence on breast cancer risk) - substitute dark chocolate for other sweets when possible - substitute water - can add lemon or orange slices for taste - for diet sodas (artificial sweeteners will trick your  body that you can eat sweets without getting calories and will lead you to overeating and weight gain in the long run) - do not skip breakfast or other meals (this will slow down the metabolism and will result in more weight gain over time)  - can try smoothies made from fruit and almond/rice milk in am instead of regular breakfast - can also try old-fashioned (not instant) oatmeal made with almond/rice milk in am - order the dressing on the side when eating salad at a restaurant (pour less than half of the dressing on the salad) - eat as little meat as possible - can try juicing, but should not forget that juicing will get rid of the fiber, so would alternate with eating raw veg./fruits or drinking smoothies - use as little oil as possible, even when using olive oil - can dress a salad with a mix of balsamic vinegar and lemon juice, for e.g. - use agave nectar, stevia sugar, or regular sugar rather than artificial sweateners - steam or broil/roast veggies  - snack on veggies/fruit/nuts (unsalted, preferably) when possible, rather than processed foods - reduce or eliminate aspartame in diet (it is in diet sodas, chewing gum, etc) Read the labels!  Try to read Dr. Janene Harvey book: "Program for Reversing Diabetes" for other ideas for healthy eating.

## 2015-03-17 ENCOUNTER — Telehealth: Payer: Self-pay | Admitting: Internal Medicine

## 2015-03-17 NOTE — Telephone Encounter (Signed)
Pharmacy has a question regarding a certain medication    Please advise   Thank you

## 2015-03-18 NOTE — Telephone Encounter (Signed)
Spoke with pharmacy. They wanted to know if ok to switch to Novolin R ([employer]) instead of Humulin R. Advised ok. Was on rx in note to pharmacy per Dr Cruzita Lederer.

## 2015-03-23 ENCOUNTER — Telehealth: Payer: Self-pay | Admitting: *Deleted

## 2015-03-23 ENCOUNTER — Ambulatory Visit: Payer: Medicare HMO | Admitting: Orthopedic Surgery

## 2015-03-23 NOTE — Telephone Encounter (Signed)
Submitted humana referral thru acuity connect for authorization to Dr. Arther Abbott, MD orthopedic with authorization number [id number]  Requesting provider: Neysa Hotter  Treating provider: Bernette Redbird  Number of visits:6  Start Date:03/24/15  End Date:09/20/15  Dx:Z98.89-other specified postprocedural states      M00.9-pyogenic arthritis,unspecified      M25.562-pian in left knee  Copy has been faxed to Dr. Aline Brochure office for records

## 2015-03-24 ENCOUNTER — Encounter: Payer: Self-pay | Admitting: Orthopedic Surgery

## 2015-03-24 ENCOUNTER — Ambulatory Visit (INDEPENDENT_AMBULATORY_CARE_PROVIDER_SITE_OTHER): Payer: Commercial Managed Care - HMO | Admitting: Orthopedic Surgery

## 2015-03-24 VITALS — BP 222/110 | Ht 69.0 in | Wt 180.0 lb

## 2015-03-24 DIAGNOSIS — M009 Pyogenic arthritis, unspecified: Secondary | ICD-10-CM | POA: Diagnosis not present

## 2015-03-24 NOTE — Patient Instructions (Signed)
Continue to stay active and keep weight down

## 2015-03-24 NOTE — Progress Notes (Signed)
Patient ID: Amanda Krueger, female   DOB: 11-07-1946, 70 y.o.   MRN: [id number] Chief Complaint  Patient presents with  . Follow-up    yearly follow up left knee scope, DOS 12/26/13.Marland KitchenMarland KitchenREF Keeler    This patient was treated for septic arthritis left knee last year with arthroscopic lavage debridement. Did well except she didn't go to therapy her choice. She is left with residual stiffness and loss of flexion and the left knee.  She is functioning very well she can do almost all her activities of daily living she has no major complaints except for some retropatellar knee pain  Review of systems the knee has not had any swelling warmth or redness  Her knee actually looks good her flexion is only 80 there is no warmth motor exam is normal her knee remains stable she has good distal pulses normal sensation vitals are stable as recorded  Impression history of septic arthritis left knee stable with residual loss of flexion. She is to call me if any problems she understands that she will have to have knee replacement surgery at some point

## 2015-03-25 ENCOUNTER — Ambulatory Visit (INDEPENDENT_AMBULATORY_CARE_PROVIDER_SITE_OTHER): Payer: Commercial Managed Care - HMO | Admitting: Podiatrist

## 2015-03-25 ENCOUNTER — Encounter: Payer: Self-pay | Admitting: Podiatrist

## 2015-03-25 DIAGNOSIS — M2040 Other hammer toe(s) (acquired), unspecified foot: Secondary | ICD-10-CM

## 2015-03-25 DIAGNOSIS — E1140 Type 2 diabetes mellitus with diabetic neuropathy, unspecified: Secondary | ICD-10-CM

## 2015-03-25 DIAGNOSIS — M79676 Pain in unspecified toe(s): Secondary | ICD-10-CM

## 2015-03-25 DIAGNOSIS — B351 Tinea unguium: Secondary | ICD-10-CM

## 2015-03-26 ENCOUNTER — Other Ambulatory Visit: Payer: Self-pay | Admitting: *Deleted

## 2015-03-26 MED ORDER — LISINOPRIL-HYDROCHLOROTHIAZIDE 20-12.5 MG PO TABS: 2.0000 | ORAL_TABLET | Freq: Every day | ORAL | Status: DC

## 2015-03-26 MED ORDER — METOPROLOL SUCCINATE ER 50 MG PO TB24: 50.0000 mg | ORAL_TABLET | Freq: Every day | ORAL | Status: DC

## 2015-03-26 NOTE — Telephone Encounter (Signed)
Pt called today stating that she is needing refills on Metoprolol and lisinopril, script was filled and pt's last ov 02/15

## 2015-04-06 ENCOUNTER — Other Ambulatory Visit: Payer: Self-pay | Admitting: Family Medicine

## 2015-04-06 MED ORDER — METOPROLOL SUCCINATE ER 50 MG PO TB24: 50.0000 mg | ORAL_TABLET | Freq: Every day | ORAL | Status: DC

## 2015-04-06 MED ORDER — LISINOPRIL-HYDROCHLOROTHIAZIDE 20-12.5 MG PO TABS: 2.0000 | ORAL_TABLET | Freq: Every day | ORAL | Status: DC

## 2015-04-06 NOTE — Telephone Encounter (Signed)
Pt states she has not heard about her prescriptions from last week.  Lisinopril and metoprolol.  [facility]ays [facility].  Rx's recent.

## 2015-04-16 NOTE — Progress Notes (Signed)
HPI: Patient presents today for follow up of diabetic foot and nail care. Past medical history, meds, and allergies reviewed. Patient states blood sugar is under fair  control.   Objective:   Objective:  Patients chart is reviewed.  Vascular status reveals pedal pulses noted at  1 out of 4 dp and pt bilateral .  Neurological sensation is Decreased to [employer] monofilament bilateral at 0/5 sites bilateral.  Dermatological exam reveals  presence of pre ulcerative/ hyperkeratotic lesions at the tips of the left third toe and right second toe. In the past these areas and ulcerated in this time they're giving her no problem..   Toenails are elongated, incurvated, discolored, dystrophic with ingrown deformity present.    Assessment: Diabetes with Neuropathy ominous symptomatic and mycotic toenails Plan: Discussed treatment options and alternatives. Debrided nails without complication. Return appointment recommended at routine intervals of 3 months.

## 2015-04-23 ENCOUNTER — Ambulatory Visit (INDEPENDENT_AMBULATORY_CARE_PROVIDER_SITE_OTHER): Payer: Commercial Managed Care - HMO | Admitting: Internal Medicine

## 2015-04-23 ENCOUNTER — Encounter: Payer: Self-pay | Admitting: Internal Medicine

## 2015-04-23 VITALS — BP 132/80 | HR 71 | Temp 98.0°F | Resp 12 | Wt 177.4 lb

## 2015-04-23 DIAGNOSIS — N189 Chronic kidney disease, unspecified: Secondary | ICD-10-CM | POA: Diagnosis not present

## 2015-04-23 DIAGNOSIS — E1122 Type 2 diabetes mellitus with diabetic chronic kidney disease: Secondary | ICD-10-CM | POA: Diagnosis not present

## 2015-04-23 NOTE — Progress Notes (Signed)
Patient ID: Amanda Krueger, female   DOB: [birth year], 69 y.o.   MRN: [id number]  HPI: Amanda Krueger is a 69 y.o.-year-old female, returning for f/u for DM2, dx in 1990s, insulin-dependent since the 1990s, uncontrolled, with complications (CKD). Last visit 1.5 mo ago.  Last hemoglobin A1c was: Lab Results  Component Value Date   HGBA1C 10.9* 02/19/2015   HGBA1C 11.0* 11/07/2013   HGBA1C 11.3* 03/01/2012  She had knee sx in 01/2014 >> since then, she has a lot of pain. She also lost weight 20 lbs. She is unhappy with her weight, would like to gain weight back.  Pt is on a regimen of: - Lantus dose to 18 units at bedtime - sometimes misses doses as goes to sleep early - Regular insulin 30 min before every main meal (3x a day) - she is never home - she thought it needs to be refrigerated! Now, misses 1-2x a day. - 4 units before a smaller meal - 6 units before a larger meal At last visit, she was not taking the prescribed Novolog 5 units 3x a day, before meals. She stopped this 6-7 mo ago as she got discouraged b/c she was still needing insulin despite her weight loss. She also c/o the Novolog price being too high.  Pt checks her sugars 4x a day and they are: - am: 150-170 >> 73-125, 140 - 2h after b'fast: n/c - before lunch: n/c >> 93-157, 173 - 2h after lunch: can have 300s >> n/c - before dinner: n/c >> 110-207 - 2h after dinner: n/c - bedtime: n/c >> 144-204 - nighttime: n/c No lows. Lowest sugar was 67; she has hypoglycemia awareness at 70.  Highest sugar was 300s.  Glucometer: AccuChek  Pt's meals are: - Breakfast: [place] bacon; egg; oatmeal; toast and coffee - Lunch: spaghetti, soup, salad - Dinner: chicken, [place], [employer] research officer, government. - Snacks: 2-3  - + CKD, last BUN/creatinine:  Lab Results  Component Value Date   BUN 29* 02/19/2015   CREATININE 1.41* 02/19/2015   - last set of lipids: Lab Results  Component Value Date   CHOL 276* 02/19/2015   HDL 97 02/19/2015   LDLCALC  157* 02/19/2015   TRIG 108 02/19/2015   CHOLHDL 2.8 02/19/2015  On Crestor 10. - last eye exam was in 2013. Had cataract Sx OU. ? DR.  - + numbness and tingling in her feet. + calluses on feet. Has a podiatrist.  ROS: Constitutional: no weight loss, no fatigue, no subjective hyperthermia/hypothermia, no excessive urination and nocturia Eyes: no blurry vision, no xerophthalmia ENT: no sore throat, no nodules palpated in throat, no dysphagia/odynophagia, no hoarseness, + tinnitus Cardiovascular: no CP/SOB/palpitations/ leg swelling  Respiratory: no cough/SOB Gastrointestinal: no N/V/D/C Musculoskeletal: no muscle/+ joint aches (L knee pain) Skin: no rashes Neurological: no tremors/numbness/tingling/dizziness  I reviewed pt's medications, allergies, PMH, social hx, family hx, and changes were documented in the history of present illness. Otherwise, unchanged from my initial visit note.  Past Medical History  Diagnosis Date  . Diabetes mellitus   . Hypertension   . DJD (degenerative joint disease)   . Hyperlipidemia    Past Surgical History  Procedure Laterality Date  . Right toe      Great toe  . Cataract extraction, bilateral      right eye-2007, left eye-2012  .  rt toe debridement    . Knee arthroscopy Left 12/26/2013    Procedure: ARTHROSCOPY KNEE;  Surgeon: Carole Civil, MD;  Location: [facility];  Service: Orthopedics;  Laterality: Left;  . Exam under anesthesia with manipulation of knee Left 12/26/2013    Procedure: EXAM UNDER ANESTHESIA WITH MANIPULATION OF KNEE;  Surgeon: Carole Civil, MD;  Location: [facility];  Service: Orthopedics;  Laterality: Left;  . Synovectomy Left 12/26/2013    Procedure: EXTENSIVE SYNOVECTOMY LEFT KNEE;  Surgeon: Carole Civil, MD;  Location: [facility];  Service: Orthopedics;  Laterality: Left;  . Wound debridement Left 12/26/2013    Procedure: LEFT GREAT TOE CALLOUS DEBRIDEMENT;  Surgeon: Carole Civil, MD;  Location: [facility];  Service:  Orthopedics;  Laterality: Left;   History   Social History  . Marital Status: Divorced    Spouse Name: N/A  . Number of Children: 3   Occupational History  . Retired - disability.   Social History Main Topics  . Smoking status: Never Smoker   . Smokeless tobacco: Not on file  . Alcohol Use: No  . Drug Use: No  . Sexual Activity: Not on file   Current Outpatient Prescriptions on File Prior to Visit  Medication Sig Dispense Refill  . ACCU-CHEK FASTCLIX LANCETS MISC     . ACCU-CHEK SMARTVIEW test strip     . aspirin 81 MG tablet Take 81 mg by mouth daily.    . Blood Glucose Monitoring Suppl (ACCU-CHEK NANO SMARTVIEW) W/DEVICE KIT     . diclofenac sodium (VOLTAREN) 1 % GEL Apply dime size  three times a day as needed, to affected areas 1 Tube 3  . fish oil-omega-3 fatty acids 1000 MG capsule Take 2 g by mouth daily.    . insulin glargine (LANTUS) 100 UNIT/ML injection Inject under skin 18 units at bedtime 10 mL 11  . insulin regular (HUMULIN R) 100 units/mL injection Inject 0.04-0.06 mLs (4-6 Units total) into the skin 3 (three) times daily before meals. 10 mL 2  . lisinopril-hydrochlorothiazide (PRINZIDE,ZESTORETIC) 20-12.5 MG per tablet Take 2 tablets by mouth daily. 180 tablet 1  . metoprolol succinate (TOPROL-XL) 50 MG 24 hr tablet Take 1 tablet (50 mg total) by mouth daily. Take with or immediately following a meal. 90 tablet 3  . rosuvastatin (CRESTOR) 10 MG tablet Take 10 mg by mouth daily.     No current facility-administered medications on file prior to visit.   No Known Allergies Family History  Problem Relation Age of Onset  . Diabetes Mother   . Stroke Father   . Heart disease Father   . Diabetes Sister   . Diabetes Brother   . Diabetes Sister    PE: BP 132/80 mmHg  Pulse 71  Temp(Src) 98 F (36.7 C) (Oral)  Resp 12  Wt 177 lb 6.4 oz (80.468 kg)  SpO2 98% Body mass index is 26.19 kg/(m^2). Wt Readings from Last 3 Encounters:  04/23/15 177 lb 6.4 oz  (80.468 kg)  03/24/15 180 lb (81.647 kg)  03/15/15 180 lb (81.647 kg)   Constitutional: slightly overweight, in NAD Eyes: PERRLA, EOMI, no exophthalmos ENT: moist mucous membranes, no thyromegaly, no cervical lymphadenopathy Cardiovascular: RRR, No MRG Respiratory: CTA B Gastrointestinal: abdomen soft, NT, ND, BS+ Musculoskeletal: no deformities, strength intact in all 4 Skin: moist, warm, no rashes Neurological: no tremor with outstretched hands, DTR normal in all 4  ASSESSMENT: 1. DM2, insulin-dependent, uncontrolled, with complications - CKD  PLAN:  1. Patient with long-standing, uncontrolled diabetes, on long acting insulin + added Regular insulin >> improved sugars. She missed Regular insulin doses as she thought this needs  to be refrigerated and did not take it with her >> advised that she can take the vial with her. Will also move the Lantus after dinner as she may forget it at bedtime. - I suggested to:  Patient Instructions  Please continue Lantus 18 units but move it after dinner.  Please continue Regular insulin 30 min before every main meal (3x a day): - 4 units before a smaller meal - 6 units before a larger meal  Please return in 3 months with your sugar log.   - continue checking sugars at different times of the day - check 3 times a day, rotating checks - advised for yearly eye exams >> she needs one! - I offered to refer her to nutrition, but she refused  - Return to clinic in 3 mo with sugar log

## 2015-04-23 NOTE — Patient Instructions (Signed)
Please continue Lantus 18 units but move it after dinner.  Please continue Regular insulin 30 min before every main meal (3x a day): - 4 units before a smaller meal - 6 units before a larger meal  Please return in 3 months with your sugar log.

## 2015-06-11 ENCOUNTER — Ambulatory Visit: Payer: Commercial Managed Care - HMO | Admitting: Dietician

## 2015-06-22 ENCOUNTER — Ambulatory Visit: Payer: Commercial Managed Care - HMO | Admitting: Family Medicine

## 2015-07-02 ENCOUNTER — Ambulatory Visit (INDEPENDENT_AMBULATORY_CARE_PROVIDER_SITE_OTHER): Payer: Commercial Managed Care - HMO | Admitting: Podiatry

## 2015-07-02 ENCOUNTER — Encounter: Payer: Self-pay | Admitting: Podiatry

## 2015-07-02 VITALS — BP 184/92 | HR 60 | Resp 18

## 2015-07-02 DIAGNOSIS — Q828 Other specified congenital malformations of skin: Secondary | ICD-10-CM | POA: Diagnosis not present

## 2015-07-02 DIAGNOSIS — B351 Tinea unguium: Secondary | ICD-10-CM | POA: Diagnosis not present

## 2015-07-02 DIAGNOSIS — E1140 Type 2 diabetes mellitus with diabetic neuropathy, unspecified: Secondary | ICD-10-CM | POA: Diagnosis not present

## 2015-07-02 DIAGNOSIS — M79676 Pain in unspecified toe(s): Secondary | ICD-10-CM

## 2015-07-06 NOTE — Progress Notes (Signed)
Patient ID: Amanda Krueger, female   DOB: 10-30-1946, 69 y.o.   MRN: [id number]  Subjective: 69 y.o. female returns the office today for painful, elongated, thickened toenails and calluses which she is unable to trim herself. Denies any redness or drainage around the nails/calluses. Denies any acute changes since last appointment and no new complaints today. Denies any systemic complaints such as fevers, chills, nausea, vomiting.   Objective: AAO 3, NAD DP/PT pulses palpable 1/4, CRT less than 3 seconds Protective sensation decreased with [employer] monofilament, Achilles tendon reflex intact.  Nails hypertrophic, dystrophic, elongated, brittle, discolored 10. There is tenderness overlying the nails 1-5 bilaterally. There is no surrounding erythema or drainage along the nail sites. Hyperkeratotic lesions right second left third digit. Upon debridement there is no underlying ulceration, drainage or other clinical signs of infection. No open lesions or pre-ulcerative lesions are identified. No other areas of tenderness bilateral lower extremities. No overlying edema, erythema, increased warmth. No pain with calf compression, swelling, warmth, erythema.  Assessment: Patient presents with symptomatic onychomycosis; hyperkeratotic lesions; neuropathy  Plan: -Treatment options including alternatives, risks, complications were discussed -Nails sharply debrided 10 without complication/bleeding. -Hyperkeratotic lesion sharply debrided 2 without complication/bleeding. -Discussed daily foot inspection. If there are any changes, to call the office immediately.  -Follow-up in 3 months or sooner if any problems are to arise. In the meantime, encouraged to call the office with any questions, concerns, changes symptoms.   Celesta Gentile, DPM

## 2015-07-12 ENCOUNTER — Encounter: Payer: Self-pay | Admitting: Family Medicine

## 2015-07-12 ENCOUNTER — Ambulatory Visit (INDEPENDENT_AMBULATORY_CARE_PROVIDER_SITE_OTHER): Payer: Commercial Managed Care - HMO | Admitting: Family Medicine

## 2015-07-12 VITALS — BP 130/76 | HR 78 | Temp 98.2°F | Resp 16 | Ht 69.0 in | Wt 178.0 lb

## 2015-07-12 DIAGNOSIS — M25511 Pain in right shoulder: Secondary | ICD-10-CM

## 2015-07-12 DIAGNOSIS — E785 Hyperlipidemia, unspecified: Secondary | ICD-10-CM

## 2015-07-12 DIAGNOSIS — E1122 Type 2 diabetes mellitus with diabetic chronic kidney disease: Secondary | ICD-10-CM | POA: Diagnosis not present

## 2015-07-12 DIAGNOSIS — N189 Chronic kidney disease, unspecified: Secondary | ICD-10-CM

## 2015-07-12 DIAGNOSIS — I10 Essential (primary) hypertension: Secondary | ICD-10-CM | POA: Diagnosis not present

## 2015-07-12 DIAGNOSIS — H539 Unspecified visual disturbance: Secondary | ICD-10-CM | POA: Diagnosis not present

## 2015-07-12 DIAGNOSIS — Z9889 Other specified postprocedural states: Secondary | ICD-10-CM

## 2015-07-12 DIAGNOSIS — N182 Chronic kidney disease, stage 2 (mild): Secondary | ICD-10-CM

## 2015-07-12 DIAGNOSIS — M25562 Pain in left knee: Secondary | ICD-10-CM | POA: Diagnosis not present

## 2015-07-12 DIAGNOSIS — E1129 Type 2 diabetes mellitus with other diabetic kidney complication: Secondary | ICD-10-CM | POA: Diagnosis not present

## 2015-07-12 MED ORDER — EZETIMIBE 10 MG PO TABS: 10.0000 mg | ORAL_TABLET | Freq: Every day | ORAL | Status: DC

## 2015-07-12 MED ORDER — OXYCODONE-ACETAMINOPHEN 5-325 MG PO TABS: 1.0000 | ORAL_TABLET | Freq: Four times a day (QID) | ORAL | Status: DC | PRN

## 2015-07-12 NOTE — Assessment & Plan Note (Signed)
Unable to tolerate to statin drugs. Her cholesterol however significantly elevated and she is very high risk for cardiovascular disease. I've given her a sample of Zetia

## 2015-07-12 NOTE — Assessment & Plan Note (Signed)
Uncontrolled diabetes mellitus she is being followed by endocrinology. I'm that he obtain the labs today and for them to her endocrinologist. Discussed the importance of her taking the insulin as prescribed

## 2015-07-12 NOTE — Assessment & Plan Note (Addendum)
Persistent knee pain status post surgery. Ambien refill her Percocet which she has not had in quite some time I will also refer her back to her orthopedist who performed surgery for reevaluation Advised to try voltaren to shoulder as well

## 2015-07-12 NOTE — Patient Instructions (Addendum)
Referral to orthopedics for shoulder and left knee  Voltaren gel to shoulder Take the pain medications We will call with lab results  Zetia sample for cholesterol F/U 4 months

## 2015-07-12 NOTE — Assessment & Plan Note (Signed)
I queried she's been compliant with medications. She had 2 separate specially visit for her blood pressure was elevated she tells me that she was not taking her medications at that time

## 2015-07-12 NOTE — Progress Notes (Signed)
Patient ID: Amanda Krueger, female   DOB: 09/23/1946, 69 y.o.   MRN: [id number]   Subjective:    Patient ID: Amanda Krueger, female    DOB: 01/12/1946, 69 y.o.   MRN: [id number]  Patient presents for 4 month F/U and Joint Pain  patient here to follow-up chronic medical problems. She is being followed by endocrinology for diabetes but she is overdue for A1c she has not liked taking her insulin because it is cause weight gain and states that she is injecting Lantus 18 units as prescribed and NovoLog 4 units with each meal. Her last A1c was 10.9% showing uncontrolled diabetes mellitus. She did not bring her blood sugar log with her today. She also requests referral to Dr. Iona Hansen ophthalmology for follow-up.  She continues to have left knee pain she is status post arthroscopy on the left knee which she then has septic joint. She still has significant pain when walking she gets swelling on and off as well. She has not returned to her orthopedic surgeon regarding this. She is no longer taking pain medication. She also complains of right shoulder pain which she has had for the past 2 months and persistently getting worse. She had a similar pain in her left shoulder some years ago. She does not remember any particular injury but when she tries to reach overhead she has significant pain. She denies any tingling or numbness in the hand.  Review Of Systems:  GEN- denies fatigue, fever, weight loss,weakness, recent illness HEENT- denies eye drainage, change in vision, nasal discharge, CVS- denies chest pain, palpitations RESP- denies SOB, cough, wheeze ABD- denies N/V, change in stools, abd pain GU- denies dysuria, hematuria, dribbling, incontinence MSK- +joint pain, muscle aches, injury Neuro- denies headache, dizziness, syncope, seizure activity       Objective:    BP 130/76 mmHg  Pulse 78  Temp(Src) 98.2 F (36.8 C) (Oral)  Resp 16  Ht 5\' 9"  (1.753 m)  Wt 178 lb (80.74 kg)  BMI 26.27  kg/m2 GEN- NAD, alert and oriented x3 HEENT- PERRL, EOMI, non injected sclera, pink conjunctiva, MMM, oropharynx clear Neck- Supple, no thyromegaly CVS- RRR, no murmur RESP-CTAB MSK- Bilat shoulder normal inspection, fair ROM, + empty can, biceps intact, pain with elevation of arm  Left knee- mild effusion, no warmth, decreased ROM, +crepitus  EXT- pedal  edema Pulses- Radial, DP- 2+        Assessment & Plan:      Problem List Items Addressed This Visit    Type II diabetes mellitus with renal manifestations - Primary   Relevant Orders   CBC with Differential/Platelet   Comprehensive metabolic panel   Hemoglobin A1c   Microalbumin / creatinine urine ratio   S/P knee surgery   HTN (hypertension)   Relevant Medications   ezetimibe (ZETIA) 10 MG tablet   CKD (chronic kidney disease) stage 2, GFR 60-89 ml/min    Other Visit Diagnoses    Right shoulder pain           Note: This dictation was prepared with Dragon dictation along with smaller phrase technology. Any transcriptional errors that result from this process are unintentional.

## 2015-07-13 LAB — COMPREHENSIVE METABOLIC PANEL
ALBUMIN: 4 g/dL (ref 3.6–5.1)
ALK PHOS: 84 U/L (ref 33–130)
ALT: 11 U/L (ref 6–29)
AST: 18 U/L (ref 10–35)
BILIRUBIN TOTAL: 0.4 mg/dL (ref 0.2–1.2)
BUN: 23 mg/dL (ref 7–25)
CALCIUM: 9.4 mg/dL (ref 8.6–10.4)
CHLORIDE: 102 mmol/L (ref 98–110)
CO2: 29 mmol/L (ref 20–31)
Creat: 1.62 mg/dL — ABNORMAL HIGH (ref 0.50–0.99)
Glucose, Bld: 125 mg/dL — ABNORMAL HIGH (ref 70–99)
Potassium: 4.4 mmol/L (ref 3.5–5.3)
Sodium: 141 mmol/L (ref 135–146)
Total Protein: 7.5 g/dL (ref 6.1–8.1)

## 2015-07-13 LAB — CBC WITH DIFFERENTIAL/PLATELET
BASOS ABS: 0.1 10*3/uL (ref 0.0–0.1)
Basophils Relative: 1 % (ref 0–1)
EOS PCT: 3 % (ref 0–5)
Eosinophils Absolute: 0.2 10*3/uL (ref 0.0–0.7)
HCT: 38.5 % (ref 36.0–46.0)
HEMOGLOBIN: 12.7 g/dL (ref 12.0–15.0)
Lymphocytes Relative: 48 % — ABNORMAL HIGH (ref 12–46)
Lymphs Abs: 2.6 10*3/uL (ref 0.7–4.0)
MCH: 28.2 pg (ref 26.0–34.0)
MCHC: 33 g/dL (ref 30.0–36.0)
MCV: 85.6 fL (ref 78.0–100.0)
MPV: 10.7 fL (ref 8.6–12.4)
Monocytes Absolute: 0.3 10*3/uL (ref 0.1–1.0)
Monocytes Relative: 6 % (ref 3–12)
NEUTROS ABS: 2.3 10*3/uL (ref 1.7–7.7)
Neutrophils Relative %: 42 % — ABNORMAL LOW (ref 43–77)
Platelets: 174 10*3/uL (ref 150–400)
RBC: 4.5 MIL/uL (ref 3.87–5.11)
RDW: 14.9 % (ref 11.5–15.5)
WBC: 5.4 10*3/uL (ref 4.0–10.5)

## 2015-07-13 LAB — HEMOGLOBIN A1C
HEMOGLOBIN A1C: 9 % — AB (ref <5.7)
MEAN PLASMA GLUCOSE: 212 mg/dL — AB (ref <117)

## 2015-07-13 LAB — MICROALBUMIN / CREATININE URINE RATIO
Creatinine, Urine: 113.3 mg/dL
MICROALB UR: 106.5 mg/dL — AB (ref <2.0)
Microalb Creat Ratio: 940 mg/g — ABNORMAL HIGH (ref 0.0–30.0)

## 2015-07-26 ENCOUNTER — Telehealth: Payer: Self-pay | Admitting: *Deleted

## 2015-07-26 NOTE — Telephone Encounter (Signed)
Submitted [employer] referral thru [facility] for authorization on 07/21/15 to Dr. Leary Roca Ophthamology with authorization [id number]  Requesting provider: Neysa Hotter  Treating provider: Leary Roca, OD  Number of visits: 6  Start Date:07/27/15  End Date:01/23/16  Dx:E11.22-Type 2 diabetes mellitus w diabetic chronic kidney disease      N18.9-Chronic kidney disease,unspecified      H53.9- unspecified visual disturbance  Copy has been faxed to Dr. Iona Hansen office for review/records

## 2015-07-27 ENCOUNTER — Ambulatory Visit: Payer: Commercial Managed Care - HMO | Admitting: Internal Medicine

## 2015-07-27 DIAGNOSIS — E119 Type 2 diabetes mellitus without complications: Secondary | ICD-10-CM | POA: Diagnosis not present

## 2015-07-27 LAB — HM DIABETES EYE EXAM

## 2015-07-29 ENCOUNTER — Ambulatory Visit (INDEPENDENT_AMBULATORY_CARE_PROVIDER_SITE_OTHER): Payer: Commercial Managed Care - HMO | Admitting: Orthopedic Surgery

## 2015-07-29 ENCOUNTER — Ambulatory Visit (INDEPENDENT_AMBULATORY_CARE_PROVIDER_SITE_OTHER): Payer: Commercial Managed Care - HMO

## 2015-07-29 ENCOUNTER — Encounter: Payer: Self-pay | Admitting: Orthopedic Surgery

## 2015-07-29 VITALS — BP 207/119 | Ht 69.0 in | Wt 178.0 lb

## 2015-07-29 DIAGNOSIS — M75101 Unspecified rotator cuff tear or rupture of right shoulder, not specified as traumatic: Secondary | ICD-10-CM | POA: Diagnosis not present

## 2015-07-29 DIAGNOSIS — M25562 Pain in left knee: Secondary | ICD-10-CM | POA: Diagnosis not present

## 2015-07-29 DIAGNOSIS — M009 Pyogenic arthritis, unspecified: Secondary | ICD-10-CM | POA: Diagnosis not present

## 2015-07-29 MED ORDER — ACETAMINOPHEN-CODEINE #3 300-30 MG PO TABS: 1.0000 | ORAL_TABLET | Freq: Four times a day (QID) | ORAL | Status: DC | PRN

## 2015-07-29 MED ORDER — PROMETHAZINE HCL 12.5 MG PO TABS: 12.5000 mg | ORAL_TABLET | Freq: Four times a day (QID) | ORAL | Status: DC | PRN

## 2015-07-29 NOTE — Progress Notes (Signed)
Patient ID: Amanda Krueger, female   DOB: 13-Dec-1946, 69 y.o.   MRN: [id number]  Chief Complaint  Patient presents with  . Follow-up    Follow up on left knee pain, referred by Dr. Buelah Manis. DOS for [facility], 12-26-13.    HPI Amanda Krueger is a 69 y.o. female.  This patient had presumably septic arthritis had a knee scope to debride the joint and irrigated back in January 2015 she had residual loss of flexion did not want to go to therapy presents now with recurrent pain in the left knee no warmth no erythema no fever chills catching locking or giving way but difficulty ambulating  Review of Systems Review of Systems Right shoulder pain 3 months painful forward elevation painful sleeping rolling on the right side no trauma   Past Medical History  Diagnosis Date  . Diabetes mellitus   . Hypertension   . DJD (degenerative joint disease)   . Hyperlipidemia     Past Surgical History  Procedure Laterality Date  . Right toe      Great toe  . Cataract extraction, bilateral      right eye-2007, left eye-2012  .  rt toe debridement    . Knee arthroscopy Left 12/26/2013    Procedure: ARTHROSCOPY KNEE;  Surgeon: Carole Civil, MD;  Location: [facility];  Service: Orthopedics;  Laterality: Left;  . Exam under anesthesia with manipulation of knee Left 12/26/2013    Procedure: EXAM UNDER ANESTHESIA WITH MANIPULATION OF KNEE;  Surgeon: Carole Civil, MD;  Location: [facility];  Service: Orthopedics;  Laterality: Left;  . Synovectomy Left 12/26/2013    Procedure: EXTENSIVE SYNOVECTOMY LEFT KNEE;  Surgeon: Carole Civil, MD;  Location: [facility];  Service: Orthopedics;  Laterality: Left;  . Wound debridement Left 12/26/2013    Procedure: LEFT GREAT TOE CALLOUS DEBRIDEMENT;  Surgeon: Carole Civil, MD;  Location: [facility];  Service: Orthopedics;  Laterality: Left;    Family History  Problem Relation Age of Onset  . Diabetes Mother   . Stroke Father   . Heart disease Father   . Diabetes Sister    . Diabetes Brother   . Diabetes Sister     Social History Social History  Substance Use Topics  . Smoking status: Never Smoker   . Smokeless tobacco: None  . Alcohol Use: No    Allergies  Allergen Reactions  . Statins     Current Outpatient Prescriptions  Medication Sig Dispense Refill  . ACCU-CHEK FASTCLIX LANCETS MISC     . ACCU-CHEK SMARTVIEW test strip     . acetaminophen-codeine (TYLENOL #3) 300-30 MG per tablet Take 1 tablet by mouth every 6 (six) hours as needed for moderate pain. 56 tablet 0  . aspirin 81 MG tablet Take 81 mg by mouth daily.    . Blood Glucose Monitoring Suppl (ACCU-CHEK NANO SMARTVIEW) W/DEVICE KIT     . diclofenac sodium (VOLTAREN) 1 % GEL Apply dime size  three times a day as needed, to affected areas 1 Tube 3  . ezetimibe (ZETIA) 10 MG tablet Take 1 tablet (10 mg total) by mouth daily. 30 tablet 3  . fish oil-omega-3 fatty acids 1000 MG capsule Take 2 g by mouth daily.    . insulin glargine (LANTUS) 100 UNIT/ML injection Inject under skin 18 units at bedtime 10 mL 11  . insulin regular (HUMULIN R) 100 units/mL injection Inject 0.04-0.06 mLs (4-6 Units total) into the skin 3 (three) times daily before  meals. 10 mL 2  . lisinopril-hydrochlorothiazide (PRINZIDE,ZESTORETIC) 20-12.5 MG per tablet Take 2 tablets by mouth daily. 180 tablet 1  . metoprolol succinate (TOPROL-XL) 50 MG 24 hr tablet Take 1 tablet (50 mg total) by mouth daily. Take with or immediately following a meal. 90 tablet 3  . oxyCODONE-acetaminophen (PERCOCET) 5-325 MG per tablet Take 1 tablet by mouth every 6 (six) hours as needed for severe pain. 60 tablet 0   No current facility-administered medications for this visit.       Physical Exam Physical Exam Blood pressure 207/119, height 5' 9" (1.753 m), weight 178 lb (80.74 kg).  Data Reviewed X-ray of the knee show severe arthritis looks like what would've been a septic joint  Assessment    Septic joint left  knee Osteoarthritis left knee Right shoulder impingement    Plan    I think we at least need to do a workup for sepsis of the knee there was no effusion so I didn't do an aspiration but we will get some lab tests and then I can assess whether or not there is obvious sign of infection based on the labs the knee does not look infected but with a history of with thinks she had septic arthritis and destruction of her joint. This would require a second referral for evaluation and management as I would not entertain putting in an knee even in this setting with a high likelihood of infection. Well-developed well-nourished female grooming hygiene intact BMI is normal to low oriented 3 mood pleasant gait antalgic  Right shoulder tenderness elevation only 100 shoulder stable rotator cuff appears intact neurovascular exam normal skin normal lymph nodes negative  Left shoulder normal  Right knee normal  Left knee flexion extension arcus 30-80 degrees no warmth no erythema motor exam is normal ligaments are stable scans intact neurovascular exam is normal lymph nodes are negative in the groin      Procedure note the subacromial injection shoulder RIGHT  Verbal consent was obtained to inject the  RIGHT   Shoulder  Timeout was completed to confirm the injection site is a subacromial space of the  RIGHT  shoulder   Medication used Depo-Medrol 40 mg and lidocaine 1% 3 cc  Anesthesia was provided by ethyl chloride  The injection was performed in the RIGHT  posterior subacromial space. After pinning the skin with alcohol and anesthetized the skin with ethyl chloride the subacromial space was injected using a 20-gauge needle. There were no complications  Sterile dressing was applied.     Arther Abbott 07/29/2015, 3:49 PM

## 2015-07-29 NOTE — Patient Instructions (Addendum)
FOLLOW UP IN OFFICE AFTER BLOOD WORK COMPLETED

## 2015-08-05 DIAGNOSIS — M009 Pyogenic arthritis, unspecified: Secondary | ICD-10-CM | POA: Diagnosis not present

## 2015-08-06 LAB — C-REACTIVE PROTEIN: CRP: <0.5 mg/dL (ref <0.60)

## 2015-08-06 LAB — CBC WITH DIFFERENTIAL/PLATELET
BASOS PCT: 0 % (ref 0–1)
Basophils Absolute: 0 10*3/uL (ref 0.0–0.1)
EOS ABS: 0.1 10*3/uL (ref 0.0–0.7)
EOS PCT: 2 % (ref 0–5)
HCT: 39.2 % (ref 36.0–46.0)
HEMOGLOBIN: 13.1 g/dL (ref 12.0–15.0)
Lymphocytes Relative: 54 % — ABNORMAL HIGH (ref 12–46)
Lymphs Abs: 2.7 10*3/uL (ref 0.7–4.0)
MCH: 28.5 pg (ref 26.0–34.0)
MCHC: 33.4 g/dL (ref 30.0–36.0)
MCV: 85.4 fL (ref 78.0–100.0)
MONO ABS: 0.3 10*3/uL (ref 0.1–1.0)
MONOS PCT: 6 % (ref 3–12)
MPV: 11.2 fL (ref 8.6–12.4)
Neutro Abs: 1.9 10*3/uL (ref 1.7–7.7)
Neutrophils Relative %: 38 % — ABNORMAL LOW (ref 43–77)
PLATELETS: 192 10*3/uL (ref 150–400)
RBC: 4.59 MIL/uL (ref 3.87–5.11)
RDW: 14.6 % (ref 11.5–15.5)
WBC: 5 10*3/uL (ref 4.0–10.5)

## 2015-08-06 LAB — URIC ACID: Uric Acid, Serum: 7.4 mg/dL — ABNORMAL HIGH (ref 2.4–7.0)

## 2015-08-06 LAB — SEDIMENTATION RATE: Sed Rate: 22 mm/hr (ref 0–30)

## 2015-08-09 DIAGNOSIS — H40003 Preglaucoma, unspecified, bilateral: Secondary | ICD-10-CM | POA: Diagnosis not present

## 2015-08-16 ENCOUNTER — Encounter: Payer: Self-pay | Admitting: Orthopedic Surgery

## 2015-08-16 ENCOUNTER — Ambulatory Visit (INDEPENDENT_AMBULATORY_CARE_PROVIDER_SITE_OTHER): Payer: Commercial Managed Care - HMO | Admitting: Orthopedic Surgery

## 2015-08-16 VITALS — BP 225/103 | Ht 69.0 in | Wt 178.0 lb

## 2015-08-16 DIAGNOSIS — H40003 Preglaucoma, unspecified, bilateral: Secondary | ICD-10-CM | POA: Diagnosis not present

## 2015-08-16 DIAGNOSIS — Z9889 Other specified postprocedural states: Secondary | ICD-10-CM

## 2015-08-16 DIAGNOSIS — M25562 Pain in left knee: Secondary | ICD-10-CM | POA: Diagnosis not present

## 2015-08-16 NOTE — Progress Notes (Signed)
Patient ID: Amanda Krueger, female   DOB: [birth year], 69 y.o.   MRN: [id number]  Follow up visit  Chief Complaint  Patient presents with  . Follow-up    review labs    BP 225/103 mmHg  Ht 5\' 9"  (1.753 m)  Wt 178 lb (80.74 kg)  BMI 26.27 kg/m2  Encounter Diagnoses  Name Primary?  . Left knee pain Yes  . S/P knee surgery     The patient comes in after labs were done to check her for recurrent septic arthritis. She had arthroscopy a few years ago and we thought we had some infection in that knee we did an arthroscopic lavage did well until recently when she started having some knee pain again. She has severe restriction of motion in that leg at the knee joint  Her sedimentation rate was normal white count was normal C reactive protein normal. Her gout level was 7.4 with a normal of 7.0 upper not treated as it is clinically insignificant based on examination  Recommend follow-up as needed she says she can live with the pain in her knee at this time. When she does have knee replacement surgery I would repeat these laboratory studies because she will need replacement

## 2015-08-18 ENCOUNTER — Telehealth: Payer: Self-pay | Admitting: Family Medicine

## 2015-08-18 NOTE — Telephone Encounter (Signed)
Call placed to patient.   States that she has not gone to have BP checked at this time.   Will call back with reading.   Patient declined OV for Friday, but did schedule appt for Tuesday.   MD to be made aware.

## 2015-08-18 NOTE — Telephone Encounter (Signed)
Call pt I received a note from orthopedics that her blood pressure was 225/103, please have her check her BP or come in for visit urgently today, if her BP is this high she needs to be sent to the hospital

## 2015-08-18 NOTE — Telephone Encounter (Signed)
Try to schedule OV for Friday for BP

## 2015-08-18 NOTE — Telephone Encounter (Signed)
Call placed to patient.   Patient states that she has BP meter at home, but it requires new batteries. She states that she has no transportation at this time and will not have any until after 4pm.   Patient states that she does not have any HA, palpitations or blurred vision.   States that she will go down to drugstore to have pharmacist check her BP and call back with results.   Advised that if readings remain >200/100 she should go directly to ER.

## 2015-08-18 NOTE — Telephone Encounter (Signed)
Noted, pt declines appt, has not checked BP Already advised to go to ER if she has any symptoms

## 2015-08-24 ENCOUNTER — Ambulatory Visit: Payer: Self-pay | Admitting: Family Medicine

## 2015-09-07 ENCOUNTER — Encounter: Payer: Self-pay | Admitting: Family Medicine

## 2015-09-09 ENCOUNTER — Ambulatory Visit (INDEPENDENT_AMBULATORY_CARE_PROVIDER_SITE_OTHER): Payer: Commercial Managed Care - HMO | Admitting: Internal Medicine

## 2015-09-09 ENCOUNTER — Encounter: Payer: Self-pay | Admitting: Internal Medicine

## 2015-09-09 VITALS — BP 138/78 | HR 75 | Temp 98.3°F | Resp 12 | Wt 175.0 lb

## 2015-09-09 DIAGNOSIS — N189 Chronic kidney disease, unspecified: Secondary | ICD-10-CM

## 2015-09-09 DIAGNOSIS — E1122 Type 2 diabetes mellitus with diabetic chronic kidney disease: Secondary | ICD-10-CM | POA: Diagnosis not present

## 2015-09-09 NOTE — Patient Instructions (Signed)
Please continue Lantus 18 units after dinner.  Please continue Regular insulin 30 min before every main meal (3x a day): - 4 units before a smaller meal - 6 units before a larger meal  Please return in 3 months with your sugar log.

## 2015-09-09 NOTE — Progress Notes (Signed)
Patient ID: Amanda Krueger, female   DOB: [birth year], 69 y.o.   MRN: [id number]  HPI: [name] is a 69 y.o.-year-old female, returning for f/u for DM2, dx in 1990s, insulin-dependent since the 1990s, uncontrolled, with complications (CKD, DR). Last visit 4.5 mo ago.  Last hemoglobin A1c was: Lab Results  Component Value Date   HGBA1C 9.0* 07/12/2015   HGBA1C 10.9* 02/19/2015   HGBA1C 11.0* 11/07/2013  She had knee sx in 01/2014 >> since then, she has a lot of pain. She also lost weight 20 lbs. She is unhappy with her weight, would like to gain weight back.  Pt is on a regimen of: - Lantus dose to 18 units at bedtime - Regular insulin 30 min before every main meal (3x a day) - misses 1-2x a day. - 4 units before a smaller meal - 6 units before a larger meal At last visit, she was not taking the prescribed Novolog 5 units 3x a day, before meals. She stopped this 6-7 mo ago as she got discouraged b/c she was still needing insulin despite her weight loss. She also c/o the Novolog price being too high.  Pt checks her sugars 3x a day and they are: - am: 150-170 >> 73-125, 140 >> 68, 78-109 - 2h after b'fast: n/c - before lunch: n/c >> 93-157, 173 >> 97-163, 224x1 - 2h after lunch: can have 300s >> n/c - before dinner: n/c >> 110-207 >> 108-159, 207 - 2h after dinner: n/c - bedtime: n/c >> 144-204 >> n/c - nighttime: n/c No lows. Lowest sugar was 67 >> 68x1; she has hypoglycemia awareness at 70.  Highest sugar was 300s >> 224  Glucometer: AccuChek  Pt's meals are: - Breakfast: [place] bacon; egg; oatmeal; toast and coffee - Lunch: spaghetti, soup, salad - Dinner: chicken, [place], etc. - Snacks: 2-3  - + CKD, last BUN/creatinine:  Lab Results  Component Value Date   BUN 23 07/12/2015   CREATININE 1.62* 07/12/2015   - last set of lipids: Lab Results  Component Value Date   CHOL 276* 02/19/2015   HDL 97 02/19/2015   LDLCALC 157* 02/19/2015   TRIG 108 02/19/2015    CHOLHDL 2.8 02/19/2015  On Crestor 10. - last eye exam was in 07/27/2015. Had cataract Sx OU. + DR.  - + numbness and tingling in her feet. + calluses on feet. Has a podiatrist.  ROS: Constitutional: no weight loss, no fatigue, no subjective hyperthermia/hypothermia, no excessive urination and nocturia Eyes: no blurry vision, no xerophthalmia ENT: no sore throat, no nodules palpated in throat, no dysphagia/odynophagia, no hoarseness, + tinnitus Cardiovascular: no CP/SOB/palpitations/ leg swelling  Respiratory: no cough/SOB Gastrointestinal: no N/V/D/C Musculoskeletal: no muscle/+ joint aches (L knee pain) Skin: no rashes Neurological: no tremors/numbness/tingling/dizziness  I reviewed pt's medications, allergies, PMH, social hx, family hx, and changes were documented in the history of present illness. Otherwise, unchanged from my initial visit note.  Past Medical History  Diagnosis Date  . Diabetes mellitus   . Hypertension   . DJD (degenerative joint disease)   . Hyperlipidemia    Past Surgical History  Procedure Laterality Date  . Right toe      Great toe  . Cataract extraction, bilateral      right eye-2007, left eye-2012  .  rt toe debridement    . Knee arthroscopy Left 12/26/2013    Procedure: ARTHROSCOPY KNEE;  Surgeon: Carole Civil, MD;  Location: [facility];  Service: Orthopedics;  Laterality:  Left;  . Exam under anesthesia with manipulation of knee Left 12/26/2013    Procedure: EXAM UNDER ANESTHESIA WITH MANIPULATION OF KNEE;  Surgeon: Carole Civil, MD;  Location: [facility];  Service: Orthopedics;  Laterality: Left;  . Synovectomy Left 12/26/2013    Procedure: EXTENSIVE SYNOVECTOMY LEFT KNEE;  Surgeon: Carole Civil, MD;  Location: [facility];  Service: Orthopedics;  Laterality: Left;  . Wound debridement Left 12/26/2013    Procedure: LEFT GREAT TOE CALLOUS DEBRIDEMENT;  Surgeon: Carole Civil, MD;  Location: [facility];  Service: Orthopedics;  Laterality: Left;    History   Social History  . Marital Status: Divorced    Spouse Name: N/A  . Number of Children: 3   Occupational History  . Retired - disability.   Social History Main Topics  . Smoking status: Never Smoker   . Smokeless tobacco: Not on file  . Alcohol Use: No  . Drug Use: No  . Sexual Activity: Not on file   Current Outpatient Prescriptions on File Prior to Visit  Medication Sig Dispense Refill  . ACCU-CHEK FASTCLIX LANCETS MISC 1 each 3 (three) times daily.     Marland Kitchen ACCU-CHEK SMARTVIEW test strip 1 each by Other route 3 (three) times daily.     Marland Kitchen acetaminophen-codeine (TYLENOL #3) 300-30 MG per tablet Take 1 tablet by mouth every 6 (six) hours as needed for moderate pain. 56 tablet 0  . aspirin 81 MG tablet Take 81 mg by mouth daily.    . Blood Glucose Monitoring Suppl (ACCU-CHEK NANO SMARTVIEW) W/DEVICE KIT     . diclofenac sodium (VOLTAREN) 1 % GEL Apply dime size  three times a day as needed, to affected areas 1 Tube 3  . ezetimibe (ZETIA) 10 MG tablet Take 1 tablet (10 mg total) by mouth daily. 30 tablet 3  . fish oil-omega-3 fatty acids 1000 MG capsule Take 2 g by mouth daily.    . insulin glargine (LANTUS) 100 UNIT/ML injection Inject under skin 18 units at bedtime 10 mL 11  . insulin regular (HUMULIN R) 100 units/mL injection Inject 0.04-0.06 mLs (4-6 Units total) into the skin 3 (three) times daily before meals. 10 mL 2  . lisinopril-hydrochlorothiazide (PRINZIDE,ZESTORETIC) 20-12.5 MG per tablet Take 2 tablets by mouth daily. 180 tablet 1  . metoprolol succinate (TOPROL-XL) 50 MG 24 hr tablet Take 1 tablet (50 mg total) by mouth daily. Take with or immediately following a meal. 90 tablet 3  . oxyCODONE-acetaminophen (PERCOCET) 5-325 MG per tablet Take 1 tablet by mouth every 6 (six) hours as needed for severe pain. 60 tablet 0  . promethazine (PHENERGAN) 12.5 MG tablet Take 1 tablet (12.5 mg total) by mouth every 6 (six) hours as needed for nausea or vomiting. 56 tablet 0    No current facility-administered medications on file prior to visit.   Allergies  Allergen Reactions  . Statins    Family History  Problem Relation Age of Onset  . Diabetes Mother   . Stroke Father   . Heart disease Father   . Diabetes Sister   . Diabetes Brother   . Diabetes Sister    PE: BP 138/78 mmHg  Pulse 75  Temp(Src) 98.3 F (36.8 C) (Oral)  Resp 12  Wt 175 lb (79.379 kg)  SpO2 98% Body mass index is 25.83 kg/(m^2). Wt Readings from Last 3 Encounters:  09/09/15 175 lb (79.379 kg)  08/16/15 178 lb (80.74 kg)  07/29/15 178 lb (80.74 kg)   Constitutional:  slightly overweight, in NAD Eyes: PERRLA, EOMI, no exophthalmos ENT: moist mucous membranes, no thyromegaly, no cervical lymphadenopathy Cardiovascular: RRR, No MRG Respiratory: CTA B Gastrointestinal: abdomen soft, NT, ND, BS+ Musculoskeletal: no deformities, strength intact in all 4 Skin: moist, warm, no rashes Neurological: no tremor with outstretched hands, DTR normal in all 4  ASSESSMENT: 1. DM2, insulin-dependent, uncontrolled, with complications - CKD - DR  PLAN:  1. Patient with long-standing, uncontrolled diabetes, on long acting insulin + added Regular insulin >> improved sugars. She still misses Regular insulin doses >> trying to remember to take it. Sugars are mostly at goal, but higher if she forgets Regular insulin. Overall, though, sugars are better! - I suggested to:  Patient Instructions  Please continue Lantus 18 units after dinner.  Please continue Regular insulin 30 min before every main meal (3x a day): - 4 units before a smaller meal - 6 units before a larger meal  Please return in 3 months with your sugar log.   - continue checking sugars at different times of the day - check 3 times a day, rotating checks - advised for yearly eye exams >> she needs one! - I offered to refer her to nutrition, but she refused in the past - refused a flu shot today - Return to clinic in 3 mo  with sugar log

## 2015-10-04 ENCOUNTER — Telehealth: Payer: Self-pay | Admitting: *Deleted

## 2015-10-04 NOTE — Telephone Encounter (Signed)
Submitted [facility] referral thru [facility] for authorization on 09/28/15 with Dr Danella Penton with authorization [id number]  Requesting provider: Neysa Hotter  Treating provider: Danella Penton  Number of visits:6  Start Date: 10/05/15  End Date:04/02/16  Dx:E11.40-Type 2 diabetes mellitus with diabetic neuropathy,unsp      B35.1-Tinea unguim  Copy has been faxed to the above facility for review

## 2015-10-05 ENCOUNTER — Ambulatory Visit (INDEPENDENT_AMBULATORY_CARE_PROVIDER_SITE_OTHER): Payer: Commercial Managed Care - HMO | Admitting: Podiatry

## 2015-10-05 DIAGNOSIS — B351 Tinea unguium: Secondary | ICD-10-CM | POA: Diagnosis not present

## 2015-10-05 DIAGNOSIS — L84 Corns and callosities: Secondary | ICD-10-CM

## 2015-10-05 DIAGNOSIS — M79676 Pain in unspecified toe(s): Secondary | ICD-10-CM

## 2015-10-05 DIAGNOSIS — E1140 Type 2 diabetes mellitus with diabetic neuropathy, unspecified: Secondary | ICD-10-CM

## 2015-10-05 NOTE — Progress Notes (Signed)
Patient ID: KEENAN COVAN, female   DOB: 05-Jun-1946, 69 y.o.   MRN: [id number] Complaint:  Visit Type: Patient returns to my office for continued preventative foot care services. Complaint: Patient states" my nails have grown long and thick and become painful to walk and wear shoes" Patient has been diagnosed with DM with no foot complications. The patient presents for preventative foot care services. No changes to ROS  Podiatric Exam: Vascular: dorsalis pedis and posterior tibial pulses are palpable bilateral. Capillary return is immediate. Temperature gradient is WNL. Skin turgor WNL  Sensorium: Diminished  Semmes Weinstein monofilament test. Normal tactile sensation bilaterally. Nail Exam: Pt has thick disfigured discolored nails with subungual debris noted bilateral entire nail hallux through fifth toenails Ulcer Exam: There is no evidence of ulcer or pre-ulcerative changes or infection. Orthopedic Exam: Muscle tone and strength are WNL. No limitations in general ROM. No crepitus or effusions noted. Foot type and digits show no abnormalities. Bony prominences are unremarkable. Skin: No Porokeratosis. No infection or ulcers. Distal clavi 3rd toe left foot.  Diagnosis:  Onychomycosis, , Pain in right toe, pain in left toes, Distal clavi 3rd toe left foot.  Treatment & Plan Procedures and Treatment: Consent by patient was obtained for treatment procedures. The patient understood the discussion of treatment and procedures well. All questions were answered thoroughly reviewed. Debridement of mycotic and hypertrophic toenails, 1 through 5 bilateral and clearing of subungual debris. No ulceration, no infection noted. Debride clavi. Return Visit-Office Procedure: Patient instructed to return to the office for a follow up visit 3 months for continued evaluation and treatment.

## 2015-11-04 ENCOUNTER — Other Ambulatory Visit: Payer: Self-pay | Admitting: Family Medicine

## 2015-11-04 NOTE — Telephone Encounter (Signed)
Refill appropriate and filled per protocol. 

## 2015-11-09 ENCOUNTER — Encounter: Payer: Self-pay | Admitting: Family Medicine

## 2015-11-09 ENCOUNTER — Ambulatory Visit (INDEPENDENT_AMBULATORY_CARE_PROVIDER_SITE_OTHER): Payer: Commercial Managed Care - HMO | Admitting: Family Medicine

## 2015-11-09 VITALS — BP 188/94 | HR 84 | Temp 98.4°F | Resp 16 | Ht 69.0 in | Wt 179.0 lb

## 2015-11-09 DIAGNOSIS — S90425A Blister (nonthermal), left lesser toe(s), initial encounter: Secondary | ICD-10-CM

## 2015-11-09 DIAGNOSIS — I10 Essential (primary) hypertension: Secondary | ICD-10-CM

## 2015-11-09 DIAGNOSIS — L089 Local infection of the skin and subcutaneous tissue, unspecified: Secondary | ICD-10-CM | POA: Diagnosis not present

## 2015-11-09 MED ORDER — CLONIDINE HCL 0.1 MG PO TABS
0.1000 mg | ORAL_TABLET | Freq: Once | ORAL | Status: AC
  Administered 2015-11-09: 0.1 mg via ORAL

## 2015-11-09 MED ORDER — CLINDAMYCIN HCL 300 MG PO CAPS: 300.0000 mg | ORAL_CAPSULE | Freq: Three times a day (TID) | ORAL | Status: DC

## 2015-11-09 NOTE — Patient Instructions (Signed)
For your blood pressure Take 2 of the Toprol 100mg  once a day Take both Lisinopril HCTZ in the morning  Take antibiotics, keep foot elevated F/U MOnday for recheck on Foot

## 2015-11-09 NOTE — Progress Notes (Signed)
Patient ID: Amanda Krueger, female   DOB: 12-07-1946, 69 y.o.   MRN: [id number]   Subjective:    Patient ID: Amanda Krueger, female    DOB: 08/17/1946, 69 y.o.   MRN: [id number]  Patient presents for L Foot Middle Toe  patient here for blister of her left middle toe. This is afflicted she's had chronic problems with. His toes been worked on multiple times by podiatry about every 3 months she goes in to have a large callus shaved down  And trimmed. She has a hammertoe her toes rub up against this third digit often causing redness and sore spots but the past 2 days she noted a blister come up. She denies any significant pain she has not had any drainage. She's not had any fever. On evaluation of her vital signs her blood pressure was extremely high. She states that she's been stressed out recently with a lot of family drama. She states that she is taking her medicine as prescribed she takes Toprol in the evening and she takes  Hydrochlorothiazide/ lisinopril 1 tablet twice a day and states that she has not missed any of her blood pressure medicines. Typically her blood pressure is well controlled. She states that she got an argument with her nephew about a week ago and her nerves have been bad since then.    Review Of Systems:  GEN- denies fatigue, fever, weight loss,weakness, recent illness HEENT- denies eye drainage, change in vision, nasal discharge, CVS- denies chest pain, palpitations RESP- denies SOB, cough, wheeze ABD- denies N/V, change in stools, abd pain GU- denies dysuria, hematuria, dribbling, incontinence MSK- + joint pain, muscle aches, injury Neuro- denies headache, dizziness, syncope, seizure activity       Objective:    BP 188/94 mmHg  Pulse 84  Temp(Src) 98.4 F (36.9 C) (Oral)  Resp 16  Ht 5\' 9"  (1.753 m)  Wt 179 lb (81.194 kg)  BMI 26.42 kg/m2 GEN- NAD, alert and oriented x3 HEENT- PERRL, EOMI, non injected sclera, pink conjunctiva, MMM, oropharynx clear CVS-  RRR, no murmur RESP-CTAB EXT- No edema Skin- Left foot 3rd digit- large callus and overgrown nail unchanged, large nickle size blister on medial aspect- pus/blood combination noted, crack in skin at base of toe mild bleeding, no foul odor no drainage, toe NT, sensation grossly intact hammer toe of 1st digit, rubbing on 4th digit,  Pulses- Radial, DP- 2+        Assessment & Plan:      Problem List Items Addressed This Visit    None    Visit Diagnoses    Infected blister of toe of left foot, initial encounter    -  Primary    Clindamycin TID, as she is diabetic, no sign of systemic infection, unable to get podiatry because of holiday, recheck Monday, given red flags to go to ER     Relevant Medications    clindamycin (CLEOCIN) 300 MG capsule    Essential hypertension, malignant        Given Clonidine 0.1mg  in office, recheck BP  30 minutes later 180/90. Increase Toprol to 100mg , continue lisinopril HCTZ. She was quite upset discussing her family issues. She will check BP tomorrow at home,we will call to check in on her    Relevant Medications    cloNIDine (CATAPRES) tablet 0.1 mg (Completed)       Note: This dictation was prepared with Dragon dictation along with smaller phrase technology. Any transcriptional errors that result from  this process are unintentional.

## 2015-11-10 DIAGNOSIS — E663 Overweight: Secondary | ICD-10-CM | POA: Diagnosis not present

## 2015-11-10 DIAGNOSIS — E119 Type 2 diabetes mellitus without complications: Secondary | ICD-10-CM | POA: Diagnosis not present

## 2015-11-10 DIAGNOSIS — L03032 Cellulitis of left toe: Secondary | ICD-10-CM | POA: Diagnosis not present

## 2015-11-10 DIAGNOSIS — L02612 Cutaneous abscess of left foot: Secondary | ICD-10-CM | POA: Diagnosis not present

## 2015-11-10 DIAGNOSIS — Z7982 Long term (current) use of aspirin: Secondary | ICD-10-CM | POA: Diagnosis not present

## 2015-11-10 DIAGNOSIS — Z79899 Other long term (current) drug therapy: Secondary | ICD-10-CM | POA: Diagnosis not present

## 2015-11-10 DIAGNOSIS — Z794 Long term (current) use of insulin: Secondary | ICD-10-CM | POA: Diagnosis not present

## 2015-11-10 DIAGNOSIS — I10 Essential (primary) hypertension: Secondary | ICD-10-CM | POA: Diagnosis not present

## 2015-11-15 ENCOUNTER — Ambulatory Visit (INDEPENDENT_AMBULATORY_CARE_PROVIDER_SITE_OTHER): Payer: Commercial Managed Care - HMO | Admitting: Family Medicine

## 2015-11-15 ENCOUNTER — Encounter: Payer: Self-pay | Admitting: Family Medicine

## 2015-11-15 ENCOUNTER — Encounter: Payer: Self-pay | Admitting: *Deleted

## 2015-11-15 VITALS — BP 174/82 | HR 84 | Temp 97.9°F | Resp 16 | Ht 69.0 in | Wt 179.0 lb

## 2015-11-15 DIAGNOSIS — I10 Essential (primary) hypertension: Secondary | ICD-10-CM | POA: Diagnosis not present

## 2015-11-15 DIAGNOSIS — S90425D Blister (nonthermal), left lesser toe(s), subsequent encounter: Secondary | ICD-10-CM | POA: Diagnosis not present

## 2015-11-15 DIAGNOSIS — E1122 Type 2 diabetes mellitus with diabetic chronic kidney disease: Secondary | ICD-10-CM

## 2015-11-15 DIAGNOSIS — L089 Local infection of the skin and subcutaneous tissue, unspecified: Secondary | ICD-10-CM | POA: Diagnosis not present

## 2015-11-15 DIAGNOSIS — N182 Chronic kidney disease, stage 2 (mild): Secondary | ICD-10-CM

## 2015-11-15 DIAGNOSIS — Z794 Long term (current) use of insulin: Secondary | ICD-10-CM

## 2015-11-15 DIAGNOSIS — S90425A Blister (nonthermal), left lesser toe(s), initial encounter: Secondary | ICD-10-CM

## 2015-11-15 MED ORDER — CLONIDINE HCL 0.1 MG PO TABS
0.1000 mg | ORAL_TABLET | Freq: Two times a day (BID) | ORAL | Status: DC
Start: 2015-11-15 — End: 2016-06-16

## 2015-11-15 NOTE — Patient Instructions (Signed)
[facility] Clonidine twice a day  We will call on Wed Finish the antibiotics F/U 4 months

## 2015-11-15 NOTE — Assessment & Plan Note (Signed)
Pressure still uncontrolled even though her meter at home is not quite accurate. I will then add clonidine 0.1 mg twice a day to her current regimen we will follow up on Wednesday with blood pressure readings

## 2015-11-15 NOTE — Progress Notes (Signed)
Patient ID: Amanda Krueger, female   DOB: [birth year], 69 y.o.   MRN: [id number]   Subjective:    Patient ID: Amanda Krueger, female    DOB: 14-Sep-1946, 69 y.o.   MRN: [id number]  Patient presents for 4 month F/U and F/U Infected Toe  patient here for recheck on her affected blister of her left toe. She will actually went to the emergency room as I directed this weekend after she had pus and blood draining from the toe. She states that she was given IV antibiotics as well as fluids in discharge home on the clindamycin. She is not having any significant pain of the blister is gone. She has not had any fever. She is using her topical cleansers prescriber her podiatrist. She states blood work was done which was normal at the emergency room. Her blood pressure was also elevated at the ER. She's been taken off her medication as prescribed. She had a home -rist cuff which has been reading significantly high 200s to 215/ 70-100. No CP, no SOB   Needs lab slip for DM   Review Of Systems:  GEN- denies fatigue, fever, weight loss,weakness, recent illness HEENT- denies eye drainage, change in vision, nasal discharge, CVS- denies chest pain, palpitations RESP- denies SOB, cough, wheeze ABD- denies N/V, change in stools, abd pain GU- denies dysuria, hematuria, dribbling, incontinence MSK- denies joint pain, muscle aches, injury Neuro- denies headache, dizziness, syncope, seizure activity       Objective:    BP 174/82 mmHg  Pulse 84  Temp(Src) 97.9 F (36.6 C) (Oral)  Resp 16  Ht 5\' 9"  (1.753 m)  Wt 179 lb (81.194 kg)  BMI 26.42 kg/m2 GEN- NAD, alert and oriented x3 CVS- RRR, no murmur RESP-CTAB EXT- No edema Skin- Left foot 3rd digit- large callus and overgrown nail unchanged, previous blister d/c/i no ulceration  on medial aspect-, no foul odor no drainage, toe NT, sensation grossly intact hammer toe of 1st digit, rubbing on 4th digit,  Pulses- Radial, DP- 1+        Assessment &  Plan:      Problem List Items Addressed This Visit    Type II diabetes mellitus with renal manifestations (HCC)   Relevant Orders   Hemoglobin A1c   Lipid panel   Infected blister of toe of left foot     The blisters draining on its own. She was seen in the emergency room i will obtain the records from there. She will continue  The clindamycin. No ulceration seen.      HTN (hypertension) - Primary     Pressure still uncontrolled even though her meter at home is not quite accurate. I will then add clonidine 0.1 mg twice a day to her current regimen we will follow up on Wednesday with blood pressure readings      Relevant Medications   cloNIDine (CATAPRES) 0.1 MG tablet   Other Relevant Orders   CBC with Differential/Platelet   Comprehensive metabolic panel      Note: This dictation was prepared with Dragon dictation along with smaller phrase technology. Any transcriptional errors that result from this process are unintentional.

## 2015-11-15 NOTE — Assessment & Plan Note (Signed)
The blisters draining on its own. She was seen in the emergency room i will obtain the records from there. She will continue  The clindamycin. No ulceration seen.

## 2015-11-17 ENCOUNTER — Telehealth: Payer: Self-pay | Admitting: *Deleted

## 2015-11-17 NOTE — Telephone Encounter (Signed)
-----   Message from Alycia Rossetti, MD sent at 11/17/2015  9:52 AM EST ----- Regarding: FW: F/U BP readings  Call and check on BP, started on clonidine 0.1mg  BID.Put in chart  ----- Message -----    From: Alycia Rossetti, MD    Sent: 11/17/2015      To: Alycia Rossetti, MD Subject: F/U BP readings

## 2015-11-17 NOTE — Telephone Encounter (Signed)
Dont use the wrist cuff, she needs upper arm cuff  Continue medications

## 2015-11-17 NOTE — Telephone Encounter (Signed)
Call placed to patient.   States that she is taking clonidine 0.1mg  PO BID in addition to Lisinopril/ HCTZ and Metoprolol.   Reports that BP reading this morning was 175/75.   MD to be made aware.

## 2015-11-17 NOTE — Telephone Encounter (Signed)
Call placed to patient and patient made aware.   Will call back on Friday with readings.

## 2015-12-01 ENCOUNTER — Telehealth: Payer: Self-pay | Admitting: Family Medicine

## 2015-12-01 NOTE — Telephone Encounter (Signed)
-----   Message from [place], LPN sent at [id number]  2:51 PM EST ----- Regarding: RE: F/U BP readings  Call placed to patient.   Patient states that her BP on 11/29/2015 was 159/55.  Patient has not been checking her BP regularly.   MD to be made aware.  ----- Message -----    From: Alycia Rossetti, MD    Sent: 11/24/2015   1:44 PM      To: [place], LPN Subject: FW: F/U BP readings                            What are BP readings  ----- Message -----    From: Alycia Rossetti, MD    Sent: 11/17/2015      To: Alycia Rossetti, MD Subject: F/U BP readings

## 2015-12-01 NOTE — Telephone Encounter (Signed)
Noted. Improved from previous readings, no change to meds

## 2015-12-03 ENCOUNTER — Ambulatory Visit (INDEPENDENT_AMBULATORY_CARE_PROVIDER_SITE_OTHER): Payer: Commercial Managed Care - HMO

## 2015-12-03 ENCOUNTER — Telehealth: Payer: Self-pay | Admitting: *Deleted

## 2015-12-03 ENCOUNTER — Encounter: Payer: Self-pay | Admitting: Podiatry

## 2015-12-03 ENCOUNTER — Ambulatory Visit (INDEPENDENT_AMBULATORY_CARE_PROVIDER_SITE_OTHER): Payer: Commercial Managed Care - HMO | Admitting: Podiatry

## 2015-12-03 VITALS — BP 88/67 | HR 62 | Resp 14

## 2015-12-03 DIAGNOSIS — R52 Pain, unspecified: Secondary | ICD-10-CM

## 2015-12-03 DIAGNOSIS — E11622 Type 2 diabetes mellitus with other skin ulcer: Secondary | ICD-10-CM | POA: Diagnosis not present

## 2015-12-03 DIAGNOSIS — M86172 Other acute osteomyelitis, left ankle and foot: Secondary | ICD-10-CM | POA: Diagnosis not present

## 2015-12-03 DIAGNOSIS — L98424 Non-pressure chronic ulcer of back with necrosis of bone: Secondary | ICD-10-CM | POA: Diagnosis not present

## 2015-12-03 MED ORDER — DOXYCYCLINE HYCLATE 100 MG PO TABS: 100.0000 mg | ORAL_TABLET | Freq: Two times a day (BID) | ORAL | Status: DC

## 2015-12-03 NOTE — Telephone Encounter (Signed)
Wound culture and sensitivity of left 3rd toe sent to [facility].

## 2015-12-03 NOTE — Progress Notes (Signed)
Subjective:     Patient ID: Amanda Krueger, female   DOB: 1946-02-17, 69 y.o.   MRN: [id number]  HPI this patient presents to the office with chief complaint of a skin lesion on the tip of her third toe, left foot. She had been seen in our office for treatment of the distal clavi.  of the third toe of the left foot every 3 months.  She states  she developed a blister on the tip of the third toe of the left foot.  This event occurred 4 weeks ago. She states that she's been seen by  her medical doctor at [facility].  She says she was diagnosed with an infected blister and was treated with clindamycin and sent home. She was unable to be seen in our office and has not been seen until today.  She presents to our office stating she's having no pain in her toe and wearing her regular shoes. She has developed a swollen blackened third toe with a skin lesion noted on the tip of the third toe, left foot. There is no evidence of any swelling, redness or streaking noted  over the left foot. She presents for evaluation and treatment.   Review of Systems     Objective:   Physical Exam Podiatric Exam: Vascular: dorsalis pedis and posterior tibial pulses are palpable bilateral. Capillary return is immediate. Temperature gradient is WNL. Skin turgor WNL  Sensorium: Diminished Semmes Weinstein monofilament test. Normal tactile sensation bilaterally. Nail Exam: Pt has thick disfigured discolored nails with subungual debris noted bilateral entire nail hallux through fifth toenails Ulcer Exam: There is no evidence of ulcer or pre-ulcerative changes or infection. Orthopedic Exam: Muscle tone and strength are WNL. No limitations in general ROM. No crepitus or effusions noted. Foot type and digits show no abnormalities. Bony prominences are unremarkable. Skin: There is necrotic tissue noted at the distal aspect third toe left foot.  The toe is swollen and blackened proximally over third toe.      Assessment:      Diabetic Ulcer  Osteomyelitis third toe left foot. third toe left foot.     Plan:     ROV  X-ray taken reveal absence of distal phalanx indicating OM  Distal phalanx.  Debride necrotic tissue third toe.   C & S taken from under the necrotic tissue.  To be sent to lab.  Neosporin?DSD.  Prescribe doxycycline to be taken.  RTC 1 week with Dr. Jacqualyn Posey. Home instructions noted.  Gardiner Barefoot DPM

## 2015-12-09 ENCOUNTER — Telehealth: Payer: Self-pay | Admitting: *Deleted

## 2015-12-09 ENCOUNTER — Other Ambulatory Visit: Payer: Self-pay | Admitting: Podiatry

## 2015-12-09 ENCOUNTER — Ambulatory Visit: Payer: Commercial Managed Care - HMO | Admitting: Internal Medicine

## 2015-12-09 MED ORDER — CIPROFLOXACIN HCL 500 MG PO TABS: 500.0000 mg | ORAL_TABLET | Freq: Two times a day (BID) | ORAL | Status: DC

## 2015-12-09 NOTE — Telephone Encounter (Signed)
PER DR Jacqualyn Posey CALLED PATIENT FOR DR Prudence Davidson AND STATED THAT THE CULTURE WAS POSITIVE AND WAS PUT ON CIPRO PLUS STILL TAKEN THE DOXYCYCINE AND CALLED PATIENT TO LET HER KNOW. Annagrace Carr

## 2015-12-14 ENCOUNTER — Encounter: Payer: Self-pay | Admitting: Podiatry

## 2015-12-14 ENCOUNTER — Ambulatory Visit (INDEPENDENT_AMBULATORY_CARE_PROVIDER_SITE_OTHER): Payer: Commercial Managed Care - HMO | Admitting: Podiatry

## 2015-12-14 VITALS — BP 200/93 | HR 58 | Resp 18

## 2015-12-14 DIAGNOSIS — M86172 Other acute osteomyelitis, left ankle and foot: Secondary | ICD-10-CM | POA: Diagnosis not present

## 2015-12-14 MED ORDER — DOXYCYCLINE HYCLATE 100 MG PO TABS: 100.0000 mg | ORAL_TABLET | Freq: Two times a day (BID) | ORAL | Status: DC

## 2015-12-16 NOTE — Progress Notes (Signed)
Patient ID: Amanda Krueger, female   DOB: 1946/08/27, 69 y.o.   MRN: [id number]  Subjective: 69 year old female presents the office today for follow-up evaluation of a wound and osteomyelitis to the left third toe. Since last appointment she has continued on antibiotics although she did run out of the doxycycline yesterday. She did start the ciprofloxacin and I called in for her after the culture results were obtained. She states that overall the toe is looking better. She has a swelling has decreased and she is not using any redness or drainage or any pus. She denies any systemic complaints as fevers, chills, nausea, vomiting. No calf pain, chest pain, shortness of breath. No other complaints at this time.  Objective: AAO 3, NAD DP/PT pulses palpable, CRT less than 3 seconds except for the left third digit. Protective sensation decreased Simms Weinstein monofilament The distal aspect of the left third toe is a thick hyperkeratotic lesion. Upon debridement there is no underlying ulceration, drainage. There is mild edema to the hallux without any erythema or increase in warmth. There is no ascending cellulitis. No fluctuance or crepitus. No malodor. No other open lesions or pre-ulcer lesions identified at this time. No pain with calf compression, swelling, warmth, erythema.  Assessment: 69 year old female with left third toe likely osteomyelitis  Plan: -Treatment options discussed including all alternatives, risks, and complications -Hyperkeratotic lesion left third toe is debrided without complications. He was no ongoing ulceration identified at this time. The edema to the toe appears to be resolving. There is no erythema. -Continue doxycycline and ciprofloxacin for now -Monitor for any clinical signs or symptoms of infection and directed to call the office immediately should any occur or go to the ER. -Follow-up as scheduled or sooner if any problems arise. In the meantime, encouraged to call  the office with any questions, concerns, change in symptoms.  *x-ray next appointment   Celesta Gentile, DPM

## 2015-12-24 ENCOUNTER — Encounter: Payer: Self-pay | Admitting: Podiatry

## 2015-12-27 ENCOUNTER — Ambulatory Visit: Payer: Commercial Managed Care - HMO | Admitting: Family Medicine

## 2015-12-28 ENCOUNTER — Ambulatory Visit (INDEPENDENT_AMBULATORY_CARE_PROVIDER_SITE_OTHER): Payer: Commercial Managed Care - HMO

## 2015-12-28 ENCOUNTER — Ambulatory Visit (INDEPENDENT_AMBULATORY_CARE_PROVIDER_SITE_OTHER): Payer: Commercial Managed Care - HMO | Admitting: Podiatry

## 2015-12-28 ENCOUNTER — Encounter: Payer: Self-pay | Admitting: Podiatry

## 2015-12-28 VITALS — BP 185/90 | HR 78 | Resp 18

## 2015-12-28 DIAGNOSIS — M86672 Other chronic osteomyelitis, left ankle and foot: Secondary | ICD-10-CM | POA: Diagnosis not present

## 2015-12-28 DIAGNOSIS — R52 Pain, unspecified: Secondary | ICD-10-CM

## 2015-12-28 NOTE — Progress Notes (Signed)
Patient ID: Amanda Krueger, female   DOB: May 27, 1946, 70 y.o.   MRN: [id number]  Subjective: 70 year old female presents the office today for follow-up evaluation of a wound and likely chronic osteomyelitis to the left third toe. She states that she had the swelling to the toe prior to even having this wound presently is his been ongoing for quite some time. She denies any increase in warmth or redness of the toe. She is not is any drainage or sore to the toe since last appointment. She did finish her antibiotics a couple days ago. She denies any systemic complaints as fevers, chills, nausea, vomiting. No calf pain, chest pain, shortness of breath. No other complaints at this time.  Objective: AAO 3, NAD DP/PT pulses palpable, CRT less than 3 seconds except for the left third digit. Protective sensation decreased Simms Weinstein monofilament The distal aspect of the left third toe is a thick hyperkeratotic lesion. Upon debridement there is no underlying ulceration, drainage. There is mild edema to the hallux without any erythema or increase in warmth and appears to be unchanged. Subjectively she feels that the toe is been the same size for several months. There is no ascending cellulitis. No fluctuance or crepitus. No malodor. No other open lesions or pre-ulcer lesions identified at this time. No pain with calf compression, swelling, warmth, erythema.  Assessment: 70 year old female with left third toe likely osteomyelitis, however chronic.  Plan: -Treatment options discussed including all alternatives, risks, and complications -X-rays were obtained and reviewed with the patient. Changes to the distal aspect of the third toe concerning for chronic osteomyelitis. -She wishes to follow-up any toe amputation at this time. -Lesion debrided without complications or bleeding. No underlying ulceration. -We will hold off on any further antibiotic this point and observe the toe. There is any increased  signs or symptoms of infection to call the office immediately we will restart antibiotics. -Monitor for any clinical signs or symptoms of infection and directed to call the office immediately should any occur or go to the ER. -Follow-up in 3 weeks or sooner if any problems arise. In the meantime, encouraged to call the office with any questions, concerns, change in symptoms.  *x-ray next appointment   Celesta Gentile, DPM

## 2016-01-11 ENCOUNTER — Ambulatory Visit: Payer: Commercial Managed Care - HMO | Admitting: Podiatry

## 2016-01-18 ENCOUNTER — Ambulatory Visit (INDEPENDENT_AMBULATORY_CARE_PROVIDER_SITE_OTHER): Payer: Commercial Managed Care - HMO

## 2016-01-18 ENCOUNTER — Ambulatory Visit (INDEPENDENT_AMBULATORY_CARE_PROVIDER_SITE_OTHER): Payer: Commercial Managed Care - HMO | Admitting: Podiatry

## 2016-01-18 DIAGNOSIS — R52 Pain, unspecified: Secondary | ICD-10-CM

## 2016-01-18 DIAGNOSIS — L84 Corns and callosities: Secondary | ICD-10-CM | POA: Diagnosis not present

## 2016-01-18 DIAGNOSIS — E1140 Type 2 diabetes mellitus with diabetic neuropathy, unspecified: Secondary | ICD-10-CM

## 2016-01-20 NOTE — Progress Notes (Signed)
Patient ID: Amanda Krueger, female   DOB: 1946-11-12, 70 y.o.   MRN: [id number]  Subjective: 70 year old female presents the office today for follow-up evaluation of a wound and likely chronic osteomyelitis to the left third toe. She states his last appointment the toe has been looking "normal" and she has not noticed any increase in swelling compared to normal or any redness or drainage. No open sore identified at this time. She is currently not taking antibiotics. She denies any systemic complaints as fevers, chills, nausea, vomiting. No calf pain, chest pain, shortness of breath. No other complaints at this time.  Objective: AAO 3, NAD DP/PT pulses palpable, CRT less than 3 seconds except for the left third digit. Protective sensation decreased Simms Weinstein monofilament At today's appointment is a small amount hyperkeratotic tissue buildup. After debridement there is no underlying ulceration, drainage or other signs of infection. Upon debridement of the toenail is no drainage or underlying ulceration as well. There is mild edema to the toe have her again she states it today at this is been ongoing since even before the wound started the toe is always been somewhat swollen. There is no increase in warmth or erythema. There is no fluctuance or crepitus. Other than the swelling there is no other signs of infection. No other open lesions or pre-ulcerative lesions identified bilaterally. No pain with calf compression, swelling, warmth, erythema.  Assessment: 70 year old female with resolved ulceration right third toe with continued edema although apparently this is chronic.   Plan: -Treatment options discussed including all alternatives, risks, and complications -X-rays were obtained and reviewed with the patient. Changes to the distal aspect of the third toe concerning for chronic osteomyelitis.This appears to be unchanged compared to previous x-ray.  -Lesion was debrided without complications  or bleeding. At this time there is no ulceration, warmth, redness and no other clinical signs of infection. She states that her toenail looks normal compared to what it was previous to having the ulceration. We'll continue to monitor closely.  -Monitor for any clinical signs or symptoms of infection and directed to call the office immediately should any occur or go to the ER. -Follow-up as scheduled or sooner if any problems arise. In the meantime, encouraged to call the office with any questions, concerns, change in symptoms.   Celesta Gentile, DPM

## 2016-01-24 DIAGNOSIS — H40003 Preglaucoma, unspecified, bilateral: Secondary | ICD-10-CM | POA: Diagnosis not present

## 2016-01-27 DIAGNOSIS — H40003 Preglaucoma, unspecified, bilateral: Secondary | ICD-10-CM | POA: Diagnosis not present

## 2016-02-10 ENCOUNTER — Telehealth: Payer: Self-pay | Admitting: *Deleted

## 2016-02-10 NOTE — Telephone Encounter (Signed)
Submitted [employer] referral thru [facility] for authorization on 02/07/16 to Dr. Celesta Gentile, DPM with authorization [id number]  Requesting provider: Neysa Hotter  Treating provider: Cori Razor  Number of visits:6  Start Date: 03/17/16  End Date:04/02/16  Dx: E11.40- Type 2 diabetes mellitus with diabetic neuropathy,unsp

## 2016-03-14 ENCOUNTER — Ambulatory Visit: Payer: Commercial Managed Care - HMO | Admitting: Podiatry

## 2016-03-14 ENCOUNTER — Other Ambulatory Visit: Payer: Self-pay | Admitting: Family Medicine

## 2016-03-14 DIAGNOSIS — Z1231 Encounter for screening mammogram for malignant neoplasm of breast: Secondary | ICD-10-CM

## 2016-03-15 ENCOUNTER — Ambulatory Visit: Payer: Commercial Managed Care - HMO | Admitting: Family Medicine

## 2016-03-17 ENCOUNTER — Ambulatory Visit: Payer: Commercial Managed Care - HMO | Admitting: Podiatry

## 2016-03-24 ENCOUNTER — Encounter: Payer: Self-pay | Admitting: Family Medicine

## 2016-03-27 ENCOUNTER — Ambulatory Visit (INDEPENDENT_AMBULATORY_CARE_PROVIDER_SITE_OTHER): Payer: Commercial Managed Care - HMO | Admitting: Podiatry

## 2016-03-27 ENCOUNTER — Encounter: Payer: Self-pay | Admitting: Podiatry

## 2016-03-27 DIAGNOSIS — E1140 Type 2 diabetes mellitus with diabetic neuropathy, unspecified: Secondary | ICD-10-CM

## 2016-03-27 DIAGNOSIS — L84 Corns and callosities: Secondary | ICD-10-CM

## 2016-03-27 DIAGNOSIS — B351 Tinea unguium: Secondary | ICD-10-CM | POA: Diagnosis not present

## 2016-03-27 DIAGNOSIS — M79676 Pain in unspecified toe(s): Secondary | ICD-10-CM

## 2016-03-27 NOTE — Progress Notes (Signed)
Patient ID: Amanda Krueger, female   DOB: 1946-02-15, 70 y.o.   MRN: [id number]  Subjective: 70 y.o. returns the office today for painful, elongated, thickened toenails which she cannot trim herself. Denies any redness or drainage around the nails. She states the left third toe is doing well. She's had no open sores or any drainage or any increased swelling. She states of the toe in an prior to when I first started skiing her has been chronically swollen and discolored for several years and is back to normal for her. Denies any acute changes since last appointment and no new complaints today. Denies any systemic complaints such as fevers, chills, nausea, vomiting.   Objective: AAO 3, NAD DP/PT pulses palpable, CRT less than 3 seconds Nails hypertrophic, dystrophic, elongated, brittle, discolored 10. There is tenderness overlying the nails 1-5 bilaterally. There is no surrounding erythema or drainage along the nail sites. Hyperkeratotic lesion right hallux. Upon debridement no underlying ulceration, drainage or other signs of infection.  No open lesions or  other pre-ulcerative lesions are identified. No other areas of tenderness bilateral lower extremities. No overlying edema, erythema, increased warmth. No pain with calf compression, swelling, warmth, erythema.  Assessment: Patient presents with symptomatic onychomycosis; hyperkeratotic lesions  Plan: -Treatment options including alternatives, risks, complications were discussed -Nails sharply debrided 10 without complication/bleeding. -Hyperkeratotic lesion debrided 1 without complications or bleeding. -As a left third toe for her she states is back to normal continue to monitor closely. -Discussed daily foot inspection. If there are any changes, to call the office immediately.  -Follow-up in 3 months or sooner if any problems are to arise. In the meantime, encouraged to call the office with any questions, concerns, changes  symptoms.  Celesta Gentile, DPM

## 2016-03-29 ENCOUNTER — Ambulatory Visit (HOSPITAL_COMMUNITY)
Admission: RE | Admit: 2016-03-29 | Discharge: 2016-03-29 | Disposition: A | Discharge status: Home / Self Care | Payer: Commercial Managed Care - HMO | Source: Ambulatory Visit | Attending: Family Medicine | Admitting: Family Medicine

## 2016-03-29 DIAGNOSIS — Z1231 Encounter for screening mammogram for malignant neoplasm of breast: Secondary | ICD-10-CM | POA: Diagnosis not present

## 2016-04-03 ENCOUNTER — Other Ambulatory Visit: Payer: Self-pay | Admitting: Family Medicine

## 2016-04-03 NOTE — Telephone Encounter (Signed)
Refill appropriate and filled per protocol. 

## 2016-06-16 ENCOUNTER — Ambulatory Visit (INDEPENDENT_AMBULATORY_CARE_PROVIDER_SITE_OTHER): Payer: Commercial Managed Care - HMO | Admitting: Family Medicine

## 2016-06-16 VITALS — BP 160/82 | HR 82 | Temp 98.6°F | Resp 16 | Ht 69.0 in | Wt 172.0 lb

## 2016-06-16 DIAGNOSIS — E785 Hyperlipidemia, unspecified: Secondary | ICD-10-CM | POA: Diagnosis not present

## 2016-06-16 DIAGNOSIS — Z794 Long term (current) use of insulin: Secondary | ICD-10-CM

## 2016-06-16 DIAGNOSIS — E1122 Type 2 diabetes mellitus with diabetic chronic kidney disease: Secondary | ICD-10-CM | POA: Diagnosis not present

## 2016-06-16 DIAGNOSIS — I10 Essential (primary) hypertension: Secondary | ICD-10-CM

## 2016-06-16 DIAGNOSIS — Z9114 Patient's other noncompliance with medication regimen: Secondary | ICD-10-CM | POA: Diagnosis not present

## 2016-06-16 DIAGNOSIS — N182 Chronic kidney disease, stage 2 (mild): Secondary | ICD-10-CM | POA: Diagnosis not present

## 2016-06-16 DIAGNOSIS — Z91148 Patient's other noncompliance with medication regimen for other reason: Secondary | ICD-10-CM

## 2016-06-16 MED ORDER — METOPROLOL SUCCINATE ER 50 MG PO TB24: 50.0000 mg | ORAL_TABLET | Freq: Every day | ORAL | Status: DC

## 2016-06-16 MED ORDER — EZETIMIBE 10 MG PO TABS: 10.0000 mg | ORAL_TABLET | Freq: Every day | ORAL | Status: DC

## 2016-06-16 MED ORDER — CLONIDINE HCL 0.1 MG PO TABS: 0.1000 mg | ORAL_TABLET | Freq: Two times a day (BID) | ORAL | Status: DC

## 2016-06-16 MED ORDER — LISINOPRIL-HYDROCHLOROTHIAZIDE 20-12.5 MG PO TABS: 2.0000 | ORAL_TABLET | Freq: Every day | ORAL | Status: DC

## 2016-06-16 NOTE — Patient Instructions (Addendum)
Take the lisinopril HCTZ- both tablets in the morning Take the Toprol once a day  We will call with results Referral to Dr. Jacqualyn Posey, Dr. Renne Crigler , Dr. Iona Hansen F/U 4  months

## 2016-06-16 NOTE — Progress Notes (Signed)
Patient ID: Amanda Krueger, female   DOB: [birth year], 70 y.o.   MRN: [id number]    Subjective:    Patient ID: Amanda Krueger, female    DOB: 1946/12/01, 70 y.o.   MRN: [id number]  Patient presents for Medication Review/ Refill   Pt here for follow -up.She has not been taking her medication as prescribed, which has been a long term recurrent issue for her. She states she takes too many pills and doesn't want to give her shots. She stopped going to her endocrinologist but wants to re-establish care.  She does inject Lantus 20 units at bedtime but does not check her CBG regulary, she is not using her short time insulin. She is only taking the lisinopril HCTZ   Continues to have problems with chronic knee pain, ortho recommends TKR left side  Review Of Systems:  GEN- denies fatigue, fever, weight loss,weakness, recent illness HEENT- denies eye drainage, change in vision, nasal discharge, CVS- denies chest pain, palpitations RESP- denies SOB, cough, wheeze ABD- denies N/V, change in stools, abd pain GU- denies dysuria, hematuria, dribbling, incontinence MSK- + joint pain, muscle aches, injury Neuro- denies headache, dizziness, syncope, seizure activity       Objective:    BP 160/82 mmHg  Pulse 82  Temp(Src) 98.6 F (37 C) (Oral)  Resp 16  Ht 5\' 9"  (1.753 m)  Wt 172 lb (78.019 kg)  BMI 25.39 kg/m2 GEN- NAD, alert and oriented x3,antalgic gait  HEENT- PERRL, EOMI, non injected sclera, pink conjunctiva, MMM, oropharynx clear Neck- Supple, no thyromegaly CVS- RRR, no murmur RESP-CTAB EXT- No edema Pulses- Radial, DP- decreased bilat+        Assessment & Plan:      Problem List Items Addressed This Visit    Type II diabetes mellitus with renal manifestations ([place])    Suspect uncontrolled DM due to non compliance with regimen She is willing to re-establish with her endocrinologist Will also send referrals for ongoing care with her podiatrist and opthalmolist She is on  ACE inhibitor, ASA, allergy to statins      Relevant Medications   lisinopril-hydrochlorothiazide (PRINZIDE,ZESTORETIC) 20-12.5 MG tablet   Other Relevant Orders   Hemoglobin A1c (Completed)   Lipid panel (Completed)   Ambulatory referral to Ophthalmology   Ambulatory referral to Podiatry   Ambulatory referral to Endocrinology   Non compliance w medication regimen   Hyperlipidemia   Relevant Medications   metoprolol succinate (TOPROL-XL) 50 MG 24 hr tablet   lisinopril-hydrochlorothiazide (PRINZIDE,ZESTORETIC) 20-12.5 MG tablet   ezetimibe (ZETIA) 10 MG tablet   cloNIDine (CATAPRES) 0.1 MG tablet   Other Relevant Orders   Lipid panel (Completed)   HTN (hypertension)    Non compliance main issue here. Discussed potentional CAD, Heart attack, stroke with her medical problems and improper treatment BP meds were refilled today       Relevant Medications   metoprolol succinate (TOPROL-XL) 50 MG 24 hr tablet   lisinopril-hydrochlorothiazide (PRINZIDE,ZESTORETIC) 20-12.5 MG tablet   ezetimibe (ZETIA) 10 MG tablet   cloNIDine (CATAPRES) 0.1 MG tablet   Other Relevant Orders   CBC with Differential/Platelet (Completed)   Comprehensive metabolic panel (Completed)   TSH (Completed)   Microalbumin / creatinine urine ratio (Completed)   CKD (chronic kidney disease) stage 2, GFR 60-89 ml/min - Primary   Relevant Orders   Microalbumin / creatinine urine ratio (Completed)      Note: This dictation was prepared with Dragon dictation along with smaller phrase technology. Any transcriptional  errors that result from this process are unintentional.

## 2016-06-17 LAB — COMPREHENSIVE METABOLIC PANEL
ALBUMIN: 3.8 g/dL (ref 3.6–5.1)
ALK PHOS: 101 U/L (ref 33–130)
ALT: 11 U/L (ref 6–29)
AST: 15 U/L (ref 10–35)
BILIRUBIN TOTAL: 0.4 mg/dL (ref 0.2–1.2)
BUN: 27 mg/dL — ABNORMAL HIGH (ref 7–25)
CALCIUM: 9.6 mg/dL (ref 8.6–10.4)
CO2: 27 mmol/L (ref 20–31)
CREATININE: 1.77 mg/dL — AB (ref 0.50–0.99)
Chloride: 104 mmol/L (ref 98–110)
GLUCOSE: 174 mg/dL — AB (ref 70–99)
Potassium: 4.7 mmol/L (ref 3.5–5.3)
SODIUM: 142 mmol/L (ref 135–146)
Total Protein: 7.7 g/dL (ref 6.1–8.1)

## 2016-06-17 LAB — CBC WITH DIFFERENTIAL/PLATELET
BASOS ABS: 0 {cells}/uL (ref 0–200)
Basophils Relative: 0 %
EOS ABS: 100 {cells}/uL (ref 15–500)
EOS PCT: 2 %
HCT: 39.9 % (ref 35.0–45.0)
Hemoglobin: 13.1 g/dL (ref 12.0–15.0)
LYMPHS PCT: 43 %
Lymphs Abs: 2150 cells/uL (ref 850–3900)
MCH: 28.2 pg (ref 27.0–33.0)
MCHC: 32.8 g/dL (ref 32.0–36.0)
MCV: 86 fL (ref 80.0–100.0)
MONOS PCT: 6 %
MPV: 11.4 fL (ref 7.5–12.5)
Monocytes Absolute: 300 cells/uL (ref 200–950)
NEUTROS PCT: 49 %
Neutro Abs: 2450 cells/uL (ref 1500–7800)
PLATELETS: 153 10*3/uL (ref 140–400)
RBC: 4.64 MIL/uL (ref 3.80–5.10)
RDW: 15.3 % — ABNORMAL HIGH (ref 11.0–15.0)
WBC: 5 10*3/uL (ref 3.8–10.8)

## 2016-06-17 LAB — MICROALBUMIN / CREATININE URINE RATIO
CREATININE, URINE: 188 mg/dL (ref 20–320)
MICROALB UR: 84.1 mg/dL
Microalb Creat Ratio: 447 mcg/mg creat — ABNORMAL HIGH (ref <30)

## 2016-06-17 LAB — LIPID PANEL
CHOL/HDL RATIO: 3.1 ratio (ref <=5.0)
CHOLESTEROL: 266 mg/dL — AB (ref 125–200)
HDL: 87 mg/dL (ref >=46)
LDL Cholesterol: 152 mg/dL — ABNORMAL HIGH (ref <130)
Triglycerides: 135 mg/dL (ref <150)
VLDL: 27 mg/dL (ref <30)

## 2016-06-17 LAB — HEMOGLOBIN A1C
Hgb A1c MFr Bld: 10.2 % — ABNORMAL HIGH (ref <5.7)
Mean Plasma Glucose: 246 mg/dL

## 2016-06-17 LAB — TSH: TSH: 1.01 m[IU]/L

## 2016-06-18 ENCOUNTER — Encounter: Payer: Self-pay | Admitting: Family Medicine

## 2016-06-18 DIAGNOSIS — Z9114 Patient's other noncompliance with medication regimen: Secondary | ICD-10-CM | POA: Insufficient documentation

## 2016-06-18 DIAGNOSIS — Z91148 Patient's other noncompliance with medication regimen for other reason: Secondary | ICD-10-CM | POA: Insufficient documentation

## 2016-06-18 NOTE — Assessment & Plan Note (Signed)
Non compliance main issue here. Discussed potentional CAD, Heart attack, stroke with her medical problems and improper treatment BP meds were refilled today

## 2016-06-18 NOTE — Assessment & Plan Note (Signed)
Suspect uncontrolled DM due to non compliance with regimen She is willing to re-establish with her endocrinologist Will also send referrals for ongoing care with her podiatrist and opthalmolist She is on ACE inhibitor, ASA, allergy to statins

## 2016-06-22 ENCOUNTER — Other Ambulatory Visit: Payer: Self-pay | Admitting: *Deleted

## 2016-07-19 ENCOUNTER — Other Ambulatory Visit: Payer: Self-pay | Admitting: *Deleted

## 2016-07-19 MED ORDER — METOPROLOL SUCCINATE ER 50 MG PO TB24: 50.0000 mg | ORAL_TABLET | Freq: Every day | ORAL | 3 refills | Status: DC

## 2016-07-19 MED ORDER — EZETIMIBE 10 MG PO TABS: 10.0000 mg | ORAL_TABLET | Freq: Every day | ORAL | 3 refills | Status: DC

## 2016-07-19 MED ORDER — LISINOPRIL-HYDROCHLOROTHIAZIDE 20-12.5 MG PO TABS: 2.0000 | ORAL_TABLET | Freq: Every day | ORAL | 1 refills | Status: DC

## 2016-07-19 NOTE — Telephone Encounter (Signed)
Received call from patient.   Reports that she requires refill on Zetia, Metoprolol and Lisinopril HCTZ to [facility].   Prescription sent to pharmacy.

## 2016-07-20 ENCOUNTER — Telehealth: Payer: Self-pay | Admitting: Family Medicine

## 2016-07-20 NOTE — Telephone Encounter (Signed)
Amanda Krueger # [id number] for 6 visits with Dr Jacqualyn Posey for foot care Dx E11.22, N18.2, Z79.4  07/06/16 - 01/02/17

## 2016-07-20 NOTE — Telephone Encounter (Signed)
[employer] referral for eye exam  Auth [id number] 6 visits 07/06/16 - 01/02/17   For E11.22, N18.2, Z79.4  Dr Maye Hides.

## 2016-08-02 ENCOUNTER — Ambulatory Visit (INDEPENDENT_AMBULATORY_CARE_PROVIDER_SITE_OTHER): Payer: Commercial Managed Care - HMO | Admitting: Podiatry

## 2016-08-02 ENCOUNTER — Encounter: Payer: Self-pay | Admitting: Podiatry

## 2016-08-02 DIAGNOSIS — E1140 Type 2 diabetes mellitus with diabetic neuropathy, unspecified: Secondary | ICD-10-CM

## 2016-08-02 DIAGNOSIS — M79676 Pain in unspecified toe(s): Secondary | ICD-10-CM | POA: Diagnosis not present

## 2016-08-02 DIAGNOSIS — B351 Tinea unguium: Secondary | ICD-10-CM

## 2016-08-02 DIAGNOSIS — L84 Corns and callosities: Secondary | ICD-10-CM

## 2016-08-03 NOTE — Progress Notes (Signed)
Patient ID: Amanda Krueger, female   DOB: 1946-05-06, 70 y.o.   MRN: [id number]  Subjective: 70 y.o. returns the office today for painful, elongated, thickened toenails which she cannot trim herself as well as for calluses to her left foot. Denies any redness or drainage around the nails or calluses. Denies any acute changes since last appointment and no new complaints today. Denies any systemic complaints such as fevers, chills, nausea, vomiting.   Objective: AAO 3, NAD DP/PT pulses palpable, CRT less than 3 seconds Nails hypertrophic, dystrophic, elongated, brittle, discolored 10. There is tenderness overlying the nails 1-5 bilaterally. There is no surrounding erythema or drainage along the nail sites.  Hyperkeratotic lesions left foot so metatarsal one and 5. There is evidence of blood and the callouses. Upon debridement there is no underlying ulceration, drainage or other signs of infection today. No open lesions or  other pre-ulcerative lesions are identified. No other areas of tenderness bilateral lower extremities. No overlying edema, erythema, increased warmth. No pain with calf compression, swelling, warmth, erythema.  Assessment: Patient presents with symptomatic onychomycosis; hyperkeratotic lesions  Plan: -Treatment options including alternatives, risks, complications were discussed -Nails sharply debrided 10 without complication/bleeding. -Hyperkeratotic lesion debrided 2 without complications or bleeding. Offloading pads dispensed.  -Discussed daily foot inspection. If there are any changes, to call the office immediately.  -Follow-up in 9 weeks or sooner if any problems are to arise. In the meantime, encouraged to call the office with any questions, concerns, changes symptoms.  Celesta Gentile, DPM

## 2016-08-15 ENCOUNTER — Ambulatory Visit (INDEPENDENT_AMBULATORY_CARE_PROVIDER_SITE_OTHER): Payer: Commercial Managed Care - HMO | Admitting: Internal Medicine

## 2016-08-15 ENCOUNTER — Encounter: Payer: Self-pay | Admitting: Internal Medicine

## 2016-08-15 VITALS — BP 148/84 | HR 85 | Ht 69.0 in | Wt 173.0 lb

## 2016-08-15 DIAGNOSIS — E1122 Type 2 diabetes mellitus with diabetic chronic kidney disease: Secondary | ICD-10-CM

## 2016-08-15 DIAGNOSIS — N183 Chronic kidney disease, stage 3 unspecified: Secondary | ICD-10-CM

## 2016-08-15 DIAGNOSIS — Z794 Long term (current) use of insulin: Secondary | ICD-10-CM | POA: Diagnosis not present

## 2016-08-15 DIAGNOSIS — E0822 Diabetes mellitus due to underlying condition with diabetic chronic kidney disease: Secondary | ICD-10-CM

## 2016-08-15 DIAGNOSIS — E1165 Type 2 diabetes mellitus with hyperglycemia: Secondary | ICD-10-CM

## 2016-08-15 DIAGNOSIS — IMO0002 Reserved for concepts with insufficient information to code with codable children: Secondary | ICD-10-CM | POA: Insufficient documentation

## 2016-08-15 DIAGNOSIS — E1322 Other specified diabetes mellitus with diabetic chronic kidney disease: Secondary | ICD-10-CM | POA: Insufficient documentation

## 2016-08-15 DIAGNOSIS — E1365 Other specified diabetes mellitus with hyperglycemia: Secondary | ICD-10-CM

## 2016-08-15 DIAGNOSIS — N184 Chronic kidney disease, stage 4 (severe): Secondary | ICD-10-CM

## 2016-08-15 HISTORY — DX: Long term (current) use of insulin: Z79.4

## 2016-08-15 HISTORY — DX: Chronic kidney disease, stage 3 unspecified: N18.30

## 2016-08-15 MED ORDER — INSULIN GLARGINE 100 UNIT/ML ~~LOC~~ SOLN: SUBCUTANEOUS | 11 refills | Status: DC

## 2016-08-15 NOTE — Patient Instructions (Addendum)
Please decrease the dose of Lantus to 18 units and move it to am.  Please change the R insulin: - 4 units before a smaller meal - 6 units before a larger meal  Please return in 3 months with your sugar log.

## 2016-08-15 NOTE — Progress Notes (Addendum)
Patient ID: Amanda Krueger, female   DOB: [birth year], 70 y.o.   MRN: [id number]  HPI: Amanda Krueger is a 70 y.o.-year-old female, returning for f/u for DM2, dx in 1990s, insulin-dependent since the 1990s, uncontrolled, with complications (CKD stage 3, DR).   Last visit 1 year ago!  Last hemoglobin A1c was: Lab Results  Component Value Date   HGBA1C 10.2 (H) 06/16/2016   HGBA1C 9.0 (H) 07/12/2015   HGBA1C 10.9 (H) 02/19/2015  She had knee sx in 01/2014 >> since then, she has a lot of pain. She also lost weight 20 lbs. She is unhappy with her weight, would like to gain weight back.  Pt is on a regimen of: - Lantus dose 20 units at bedtime She stopped the R insulin before last HbA1c as she was gaining weight in the stomach. Since then, she restarted it at 5 units 3x a day. - Regular insulin 30 min before every main meal (3x a day) - misses 1-2x a day. - 4 units before a smaller meal - 6 units before a larger meal At last visit, she was not taking the prescribed Novolog 5 units 3x a day, before meals. She stopped this 6-7 mo ago as she got discouraged b/c she was still needing insulin despite her weight loss. She also c/o the Novolog price being too high.  Pt checks her sugars 3x a day and they are (no log, no meter): - am: 150-170 >> 73-125, 140 >> 68, 78-109 >> 68-88 - 2h after b'fast: n/c - before lunch: n/c >> 93-157, 173 >> 97-163, 224x1 >> 110s - 2h after lunch: can have 300s >> n/c - before dinner: n/c >> 110-207 >> 108-159, 207 >>  150s - 2h after dinner: n/c - bedtime: n/c >> 144-204 >> n/c - nighttime: n/c No lows. Lowest sugar was 67 >> 68x1; she has hypoglycemia awareness at 70.  Highest sugar was 300s >> 224 >> 150  Glucometer: AccuChek  Pt's meals are: - Breakfast: [place] bacon; egg; oatmeal; toast and coffee - Lunch: spaghetti, soup, salad - Dinner: chicken, [place], [employer] research officer, government. - Snacks: 2-3  - + CKD stage 3, last BUN/creatinine:  Lab Results  Component Value Date    BUN 27 (H) 06/16/2016   CREATININE 1.77 (H) 06/16/2016   - last set of lipids: Lab Results  Component Value Date   CHOL 266 (H) 06/16/2016   HDL 87 06/16/2016   LDLCALC 152 (H) 06/16/2016   TRIG 135 06/16/2016   CHOLHDL 3.1 06/16/2016  On Crestor 10. - last eye exam was in 07/27/2015. Had cataract Sx OU. + DR.  - + numbness and tingling in her feet. + calluses on feet. Has a podiatrist.  ROS: Constitutional: no weight gain or loss, no fatigue, no subjective hyperthermia/hypothermia, no excessive urination and nocturia Eyes: no blurry vision, no xerophthalmia ENT: no sore throat, no nodules palpated in throat, no dysphagia/odynophagia, no hoarseness, + tinnitus Cardiovascular: no CP/SOB/palpitations/ leg swelling  Respiratory: no cough/SOB Gastrointestinal: no N/V/D/C Musculoskeletal: no muscle/joint aches Skin: no rashes Neurological: no tremors/numbness/tingling/dizziness  I reviewed pt's medications, allergies, PMH, social hx, family hx, and changes were documented in the history of present illness. Otherwise, unchanged from my initial visit note.  Past Medical History:  Diagnosis Date  . Diabetes mellitus   . DJD (degenerative joint disease)   . Hyperlipidemia   . Hypertension    Past Surgical History:  Procedure Laterality Date  .  rt toe debridement    .  CATARACT EXTRACTION, BILATERAL     right eye-2007, left eye-2012  . EXAM UNDER ANESTHESIA WITH MANIPULATION OF KNEE Left 12/26/2013   Procedure: EXAM UNDER ANESTHESIA WITH MANIPULATION OF KNEE;  Surgeon: Carole Civil, MD;  Location: [facility];  Service: Orthopedics;  Laterality: Left;  . KNEE ARTHROSCOPY Left 12/26/2013   Procedure: ARTHROSCOPY KNEE;  Surgeon: Carole Civil, MD;  Location: [facility];  Service: Orthopedics;  Laterality: Left;  . right toe     Great toe  . SYNOVECTOMY Left 12/26/2013   Procedure: EXTENSIVE SYNOVECTOMY LEFT KNEE;  Surgeon: Carole Civil, MD;  Location: [facility];  Service:  Orthopedics;  Laterality: Left;  . WOUND DEBRIDEMENT Left 12/26/2013   Procedure: LEFT GREAT TOE CALLOUS DEBRIDEMENT;  Surgeon: Carole Civil, MD;  Location: [facility];  Service: Orthopedics;  Laterality: Left;   History   Social History  . Marital Status: Divorced    Spouse Name: N/A  . Number of Children: 3   Occupational History  . Retired - disability.   Social History Main Topics  . Smoking status: Never Smoker   . Smokeless tobacco: Not on file  . Alcohol Use: No  . Drug Use: No  . Sexual Activity: Not on file   Current Outpatient Prescriptions on File Prior to Visit  Medication Sig Dispense Refill  . ACCU-CHEK FASTCLIX LANCETS MISC 1 each 3 (three) times daily.     Marland Kitchen ACCU-CHEK SMARTVIEW test strip 1 each by Other route 3 (three) times daily.     Marland Kitchen aspirin 81 MG tablet Take 81 mg by mouth daily.    . Blood Glucose Monitoring Suppl (ACCU-CHEK NANO SMARTVIEW) W/DEVICE KIT     . cloNIDine (CATAPRES) 0.1 MG tablet Take 1 tablet (0.1 mg total) by mouth 2 (two) times daily. 60 tablet 3  . diclofenac sodium (VOLTAREN) 1 % GEL Apply dime size  three times a day as needed, to affected areas 1 Tube 3  . ezetimibe (ZETIA) 10 MG tablet Take 1 tablet (10 mg total) by mouth daily. 90 tablet 3  . fish oil-omega-3 fatty acids 1000 MG capsule Take 2 g by mouth daily.    . insulin regular (HUMULIN R) 100 units/mL injection Inject 0.04-0.06 mLs (4-6 Units total) into the skin 3 (three) times daily before meals. 10 mL 2  . LANTUS 100 UNIT/ML injection INJECT 20 UNITS SUBCUTANEOUSLY AT BEDTIME 10 mL 11  . lisinopril-hydrochlorothiazide (PRINZIDE,ZESTORETIC) 20-12.5 MG tablet Take 2 tablets by mouth daily. 180 tablet 1  . metoprolol succinate (TOPROL-XL) 50 MG 24 hr tablet Take 1 tablet (50 mg total) by mouth daily. Take with or immediately following a meal. 90 tablet 3  . oxyCODONE-acetaminophen (PERCOCET) 5-325 MG per tablet Take 1 tablet by mouth every 6 (six) hours as needed for severe pain.  (Patient not taking: Reported on 08/15/2016) 60 tablet 0   No current facility-administered medications on file prior to visit.    Allergies  Allergen Reactions  . Statins    Family History  Problem Relation Age of Onset  . Diabetes Mother   . Stroke Father   . Heart disease Father   . Diabetes Sister   . Diabetes Brother   . Diabetes Sister    PE: BP (!) 148/84 (BP Location: Left Arm, Patient Position: Sitting)   Pulse 85   Ht '5\' 9"'  (1.753 m)   Wt 173 lb (78.5 kg)   SpO2 98%   BMI 25.55 kg/m  Body mass index is 25.55  kg/m. Wt Readings from Last 3 Encounters:  08/15/16 173 lb (78.5 kg)  06/16/16 172 lb (78 kg)  11/15/15 179 lb (81.2 kg)   Constitutional: slightly overweight, in NAD Eyes: PERRLA, EOMI, no exophthalmos ENT: moist mucous membranes, no thyromegaly, no cervical lymphadenopathy Cardiovascular: RRR, No MRG Respiratory: CTA B Gastrointestinal: abdomen soft, NT, ND, BS+ Musculoskeletal: no deformities, strength intact in all 4 Skin: moist, warm, no rashes Neurological: no tremor with outstretched hands, DTR normal in all 4  ASSESSMENT: 1. DM2, insulin-dependent, uncontrolled, with complications - CKD stage 3 - DR  PLAN:  1. Patient with long-standing, uncontrolled diabetes, on long acting insulin + Regular insulin returning after a long absence. She had stopped the insulin since last visit as she felt she was gaining weight. Since last visit with PCP, she added it back. Sugars are much better. Will decrease the Lantus dose as she has low sin am and will also move the dose in am.  Will also adjust the mealtime R insulin.  - I strongly advised her not to stop insulin anymore - CKD is worse. - CBG checked in the office today: 101 (1h after lunch) - I suggested to:  Patient Instructions  Please decrease the dose of Lantus to 18 units and move it to am.  Please change the R insulin: - 4 units before a smaller meal - 6 units before a larger meal  Please  return in 3 months with your sugar log.   - continue checking sugars at different times of the day - check 3 times a day, rotating checks - advised for yearly eye exams >> she had one >> will ask ophthalmologist for records - Return to clinic in 3 mo with sugar log   Office Visit on 08/15/2016  Component Date Value Ref Range Status  . Fructosamine 08/18/2016 327* 190 - 270 umol/L Final   The HbA1c calculated from the fructosamine is great, at 6.7%!  Philemon Kingdom, MD PhD [facility]

## 2016-08-18 LAB — FRUCTOSAMINE: FRUCTOSAMINE: 327 umol/L — AB (ref 190–270)

## 2016-08-29 ENCOUNTER — Telehealth: Payer: Self-pay

## 2016-08-29 NOTE — Telephone Encounter (Signed)
Mailed letter of lab results to patient, after not being able to contact via telephone.

## 2016-10-06 ENCOUNTER — Ambulatory Visit: Payer: Commercial Managed Care - HMO | Admitting: Podiatry

## 2016-10-09 ENCOUNTER — Ambulatory Visit (INDEPENDENT_AMBULATORY_CARE_PROVIDER_SITE_OTHER): Payer: Commercial Managed Care - HMO | Admitting: Podiatry

## 2016-10-09 ENCOUNTER — Encounter: Payer: Self-pay | Admitting: Podiatry

## 2016-10-09 DIAGNOSIS — M79676 Pain in unspecified toe(s): Secondary | ICD-10-CM | POA: Diagnosis not present

## 2016-10-09 DIAGNOSIS — Z87898 Personal history of other specified conditions: Secondary | ICD-10-CM

## 2016-10-09 DIAGNOSIS — B351 Tinea unguium: Secondary | ICD-10-CM | POA: Diagnosis not present

## 2016-10-09 DIAGNOSIS — L84 Corns and callosities: Secondary | ICD-10-CM | POA: Diagnosis not present

## 2016-10-09 DIAGNOSIS — E1140 Type 2 diabetes mellitus with diabetic neuropathy, unspecified: Secondary | ICD-10-CM

## 2016-10-11 NOTE — Progress Notes (Signed)
Patient ID: Amanda Krueger, female   DOB: 05-25-1946, 70 y.o.   MRN: [id number]  Subjective: 70 y.o. returns the office today for painful, elongated, thickened toenails which she cannot trim herself as well as for calluses to her left foot. Denies any redness or drainage around the nails or calluses. Denies any acute changes since last appointment and no new complaints today. Denies any systemic complaints such as fevers, chills, nausea, vomiting.   Objective: AAO 3, NAD DP/PT pulses palpable, CRT less than 3 seconds Sensation decreased with SWMF.  Nails hypertrophic, dystrophic, elongated, brittle, discolored 10. There is tenderness overlying the nails 1-5 bilaterally. There is no surrounding erythema or drainage along the nail sites.  Hyperkeratotic lesions left foot sub-metatarsal 1and 5. Upon debridement there is no underlying ulceration, drainage or other signs of infection today. No open lesions or  other pre-ulcerative lesions are identified. Hammertoes present  No other areas of tenderness bilateral lower extremities. No overlying edema, erythema, increased warmth. No pain with calf compression, swelling, warmth, erythema.  Assessment: Patient presents with symptomatic onychomycosis; hyperkeratotic lesions  Plan: -Treatment options including alternatives, risks, complications were discussed -Nails sharply debrided 10 without complication/bleeding. -Hyperkeratotic lesion debrided 2 without complications or bleeding. Offloading pads dispensed.  -Discussed daily foot inspection. If there are any changes, to call the office immediately.  -Paperwork completed today for precertification diabetic shoes. Given neuropathy, digital deformities as well as pre-ulcerative calluses I believe that should benefit from diabetic shoes. -Follow-up in 9 weeks or sooner if any problems are to arise. In the meantime, encouraged to call the office with any questions, concerns, changes  symptoms.  Celesta Gentile, DPM

## 2016-10-20 ENCOUNTER — Ambulatory Visit (INDEPENDENT_AMBULATORY_CARE_PROVIDER_SITE_OTHER): Payer: Commercial Managed Care - HMO | Admitting: Family Medicine

## 2016-10-20 VITALS — BP 150/78 | HR 74 | Temp 98.8°F | Resp 14 | Ht 69.0 in | Wt 175.0 lb

## 2016-10-20 DIAGNOSIS — I10 Essential (primary) hypertension: Secondary | ICD-10-CM

## 2016-10-20 DIAGNOSIS — N182 Chronic kidney disease, stage 2 (mild): Secondary | ICD-10-CM | POA: Diagnosis not present

## 2016-10-20 DIAGNOSIS — M25512 Pain in left shoulder: Secondary | ICD-10-CM

## 2016-10-20 DIAGNOSIS — Z794 Long term (current) use of insulin: Secondary | ICD-10-CM

## 2016-10-20 DIAGNOSIS — N183 Chronic kidney disease, stage 3 unspecified: Secondary | ICD-10-CM

## 2016-10-20 DIAGNOSIS — E1122 Type 2 diabetes mellitus with diabetic chronic kidney disease: Secondary | ICD-10-CM | POA: Diagnosis not present

## 2016-10-20 DIAGNOSIS — E0822 Diabetes mellitus due to underlying condition with diabetic chronic kidney disease: Secondary | ICD-10-CM | POA: Diagnosis not present

## 2016-10-20 DIAGNOSIS — E7800 Pure hypercholesterolemia, unspecified: Secondary | ICD-10-CM

## 2016-10-20 DIAGNOSIS — M75102 Unspecified rotator cuff tear or rupture of left shoulder, not specified as traumatic: Secondary | ICD-10-CM | POA: Diagnosis not present

## 2016-10-20 MED ORDER — FENOFIBRATE 48 MG PO TABS: 48.0000 mg | ORAL_TABLET | Freq: Every day | ORAL | 3 refills | Status: DC

## 2016-10-20 MED ORDER — OXYCODONE-ACETAMINOPHEN 5-325 MG PO TABS: 1.0000 | ORAL_TABLET | Freq: Four times a day (QID) | ORAL | 0 refills | Status: DC | PRN

## 2016-10-20 NOTE — Progress Notes (Signed)
Subjective:    Patient ID: Amanda Krueger, female    DOB: Jun 05, 1946, 70 y.o.   MRN: [id number]  Patient presents for 4 month F/U (is not fasting) and L Shoulder Pain (increased pain x months- reports that she has been seen by ortho in the past for shoulder pain)   DM- last A1C 10.2% in June  , she is followed by endocrine , her calculated A1C via fructosamine level was 6.7% on lantus 18units    Chronic shoulder pain- left side, history of this inpast, told she needs surgery by ortho years ago, she declined, states they wanted to remove a bone.  Uses Mobisyl cream which helps  Request pain meds   Hyperlipidemia- zetia not covered, therefore not on any meds  CKD- last Cr was 1.77     Review Of Systems:  GEN- denies fatigue, fever, weight loss,weakness, recent illness HEENT- denies eye drainage, change in vision, nasal discharge, CVS- denies chest pain, palpitations RESP- denies SOB, cough, wheeze ABD- denies N/V, change in stools, abd pain GU- denies dysuria, hematuria, dribbling, incontinence MSK-+joint pain, muscle aches, injury Neuro- denies headache, dizziness, syncope, seizure activity       Objective:    BP (!) 150/78 (BP Location: Left Arm, Patient Position: Sitting, Cuff Size: Large)   Pulse 74   Temp 98.8 F (37.1 C) (Oral)   Resp 14   Ht 5\' 9"  (1.753 m)   Wt 175 lb (79.4 kg)   SpO2 99%   BMI 25.84 kg/m  GEN- NAD, alert and oriented x3 HEENT- PERRL, EOMI, non injected sclera, pink conjunctiva, MMM, oropharynx clear Neck- Supple,fair ROM, mild TTP Left trapeizus, no spasm  CVS- RRR, no murmur RESP-CTAB MSK- Decreased ROM LUE compared to right, +empty can, pain raising arm to shoullder level, biceps in tact, +impingment signs left side  EXT- No edema Pulses- Radial, DP- 2+        Assessment & Plan:      Problem List Items Addressed This Visit    Hyperlipidemia    Unable to tolerate statins, zetia not covered Try tricor, need some reduction in  her LDL She may be a candidate for the injectable cholesterol mediccation as well       Relevant Medications   fenofibrate (TRICOR) 48 MG tablet   HTN (hypertension)    BP a little elevated has not taken all medications today Concern about her renal function, recheck levels, will need referral to nephrology with decreasing GFR in setting of HTN and her DM       Relevant Medications   fenofibrate (TRICOR) 48 MG tablet   Diabetes mellitus due to underlying condition with stage 3 chronic kidney disease, with long-term current use of insulin (HCC)    Followed by endocrine       CKD stage 3 due to type 2 diabetes mellitus (HCC)    Worsening renal function, will refer to nephrology        Other Visit Diagnoses    Rotator cuff syndrome of left shoulder    -  Primary   Given refill on percocet. Can use topicals, no NSAIDS due to worsening renal function, may benefit from injection, ortho referral, xrays    Relevant Orders   DG Cervical Spine Complete   DG Shoulder Left   Pain in joint of left shoulder       Relevant Orders   DG Cervical Spine Complete   DG Shoulder Left      Note: This  dictation was prepared with Dragon dictation along with smaller phrase technology. Any transcriptional errors that result from this process are unintentional.

## 2016-10-20 NOTE — Patient Instructions (Addendum)
Get xray at [facility] Try the tricor Take pain medication as prescribed We will call with lab results F/U 4 months

## 2016-10-21 ENCOUNTER — Encounter: Payer: Self-pay | Admitting: Family Medicine

## 2016-10-21 LAB — COMPLETE METABOLIC PANEL WITH GFR
ALBUMIN: 3.8 g/dL (ref 3.6–5.1)
ALK PHOS: 91 U/L (ref 33–130)
ALT: 11 U/L (ref 6–29)
AST: 14 U/L (ref 10–35)
BILIRUBIN TOTAL: 0.5 mg/dL (ref 0.2–1.2)
BUN: 25 mg/dL (ref 7–25)
CALCIUM: 8.8 mg/dL (ref 8.6–10.4)
CO2: 28 mmol/L (ref 20–31)
CREATININE: 1.77 mg/dL — AB (ref 0.50–0.99)
Chloride: 102 mmol/L (ref 98–110)
GFR, Est African American: 33 mL/min — ABNORMAL LOW (ref >=60)
GFR, Est Non African American: 29 mL/min — ABNORMAL LOW (ref >=60)
Glucose, Bld: 227 mg/dL — ABNORMAL HIGH (ref 70–99)
POTASSIUM: 4.4 mmol/L (ref 3.5–5.3)
Sodium: 137 mmol/L (ref 135–146)
TOTAL PROTEIN: 7.3 g/dL (ref 6.1–8.1)

## 2016-10-21 LAB — CBC WITH DIFFERENTIAL/PLATELET
BASOS ABS: 0 {cells}/uL (ref 0–200)
Basophils Relative: 0 %
Eosinophils Absolute: 112 cells/uL (ref 15–500)
Eosinophils Relative: 2 %
HEMATOCRIT: 35 % (ref 35.0–45.0)
HEMOGLOBIN: 11.6 g/dL — AB (ref 12.0–15.0)
LYMPHS ABS: 2408 {cells}/uL (ref 850–3900)
Lymphocytes Relative: 43 %
MCH: 28.9 pg (ref 27.0–33.0)
MCHC: 33.1 g/dL (ref 32.0–36.0)
MCV: 87.3 fL (ref 80.0–100.0)
MONO ABS: 336 {cells}/uL (ref 200–950)
MPV: 11.2 fL (ref 7.5–12.5)
Monocytes Relative: 6 %
NEUTROS PCT: 49 %
Neutro Abs: 2744 cells/uL (ref 1500–7800)
Platelets: 149 10*3/uL (ref 140–400)
RBC: 4.01 MIL/uL (ref 3.80–5.10)
RDW: 14.8 % (ref 11.0–15.0)
WBC: 5.6 10*3/uL (ref 3.8–10.8)

## 2016-10-21 NOTE — Assessment & Plan Note (Signed)
Followed by endocrine

## 2016-10-21 NOTE — Assessment & Plan Note (Signed)
Worsening renal function, will refer to nephrology

## 2016-10-21 NOTE — Assessment & Plan Note (Signed)
BP a little elevated has not taken all medications today Concern about her renal function, recheck levels, will need referral to nephrology with decreasing GFR in setting of HTN and her DM

## 2016-10-21 NOTE — Assessment & Plan Note (Signed)
Unable to tolerate statins, zetia not covered Try tricor, need some reduction in her LDL She may be a candidate for the injectable cholesterol mediccation as well

## 2016-10-31 ENCOUNTER — Other Ambulatory Visit: Payer: Self-pay | Admitting: Internal Medicine

## 2016-11-07 ENCOUNTER — Ambulatory Visit (INDEPENDENT_AMBULATORY_CARE_PROVIDER_SITE_OTHER): Payer: Commercial Managed Care - HMO

## 2016-11-07 ENCOUNTER — Ambulatory Visit (INDEPENDENT_AMBULATORY_CARE_PROVIDER_SITE_OTHER): Payer: Commercial Managed Care - HMO | Admitting: Orthopedic Surgery

## 2016-11-07 ENCOUNTER — Other Ambulatory Visit: Payer: Self-pay | Admitting: *Deleted

## 2016-11-07 ENCOUNTER — Encounter: Payer: Self-pay | Admitting: Orthopedic Surgery

## 2016-11-07 ENCOUNTER — Telehealth: Payer: Self-pay | Admitting: Orthopedic Surgery

## 2016-11-07 VITALS — BP 215/112 | HR 69 | Ht 70.0 in | Wt 173.0 lb

## 2016-11-07 DIAGNOSIS — G8929 Other chronic pain: Secondary | ICD-10-CM | POA: Diagnosis not present

## 2016-11-07 DIAGNOSIS — M542 Cervicalgia: Secondary | ICD-10-CM | POA: Diagnosis not present

## 2016-11-07 DIAGNOSIS — M25512 Pain in left shoulder: Secondary | ICD-10-CM

## 2016-11-07 DIAGNOSIS — M7552 Bursitis of left shoulder: Secondary | ICD-10-CM

## 2016-11-07 DIAGNOSIS — M47812 Spondylosis without myelopathy or radiculopathy, cervical region: Secondary | ICD-10-CM

## 2016-11-07 MED ORDER — CYCLOBENZAPRINE HCL 5 MG PO TABS: 5.0000 mg | ORAL_TABLET | Freq: Three times a day (TID) | ORAL | 0 refills | Status: DC | PRN

## 2016-11-07 MED ORDER — DICLOFENAC POTASSIUM 50 MG PO TABS: 50.0000 mg | ORAL_TABLET | Freq: Two times a day (BID) | ORAL | 2 refills | Status: DC

## 2016-11-07 MED ORDER — CYCLOBENZAPRINE HCL 5 MG PO TABS: 5.0000 mg | ORAL_TABLET | Freq: Three times a day (TID) | ORAL | Status: DC | PRN

## 2016-11-07 NOTE — Telephone Encounter (Signed)
yes

## 2016-11-07 NOTE — Telephone Encounter (Signed)
MEDICATION SENT 

## 2016-11-07 NOTE — Progress Notes (Signed)
Patient ID: Amanda Krueger, female   DOB: [birth year], 70 y.o.   MRN: [id number]  Chief Complaint  Patient presents with  . Neck Pain    neck pain with left shoulder and arm pain    HPI Amanda Krueger is a 70 y.o. female.  Presents with 4-6 week history of pain which began in her left shoulder progressed to her neck and down her left arm into her left hand. She has no history of trauma  Over the time. She's noticed decreased ability to raise her arm over her head she has pain at night when sleeping on her left side  She tells [place] she was scheduled to have a cervical spine fusion several years ago but refused but didn't have any symptoms until approximately 2 months ago area no prior treatment area she says the pain is severe prevents her from sleeping she can use her left arm above her head  Review of Systems Review of Systems  Constitutional: Negative for activity change, appetite change, chills, fever and unexpected weight change.  Respiratory: Negative for shortness of breath.   Cardiovascular: Negative for chest pain.  Musculoskeletal: Positive for arthralgias and joint swelling.  Neurological: Negative for weakness and numbness.    Past Medical History:  Diagnosis Date  . Diabetes mellitus   . DJD (degenerative joint disease)   . Hyperlipidemia   . Hypertension     Past Surgical History:  Procedure Laterality Date  .  rt toe debridement    . CATARACT EXTRACTION, BILATERAL     right eye-2007, left eye-2012  . EXAM UNDER ANESTHESIA WITH MANIPULATION OF KNEE Left 12/26/2013   Procedure: EXAM UNDER ANESTHESIA WITH MANIPULATION OF KNEE;  Surgeon: Carole Civil, MD;  Location: [facility];  Service: Orthopedics;  Laterality: Left;  . KNEE ARTHROSCOPY Left 12/26/2013   Procedure: ARTHROSCOPY KNEE;  Surgeon: Carole Civil, MD;  Location: [facility];  Service: Orthopedics;  Laterality: Left;  . right toe     Great toe  . SYNOVECTOMY Left 12/26/2013   Procedure: EXTENSIVE  SYNOVECTOMY LEFT KNEE;  Surgeon: Carole Civil, MD;  Location: [facility];  Service: Orthopedics;  Laterality: Left;  . WOUND DEBRIDEMENT Left 12/26/2013   Procedure: LEFT GREAT TOE CALLOUS DEBRIDEMENT;  Surgeon: Carole Civil, MD;  Location: [facility];  Service: Orthopedics;  Laterality: Left;    Social History Social History  Substance Use Topics  . Smoking status: Never Smoker  . Smokeless tobacco: Not on file  . Alcohol use No    Allergies  Allergen Reactions  . Statins     Current Meds  Medication Sig  . ACCU-CHEK FASTCLIX LANCETS MISC 1 each 3 (three) times daily.   Marland Kitchen ACCU-CHEK SMARTVIEW test strip 1 each by Other route 3 (three) times daily.   Marland Kitchen aspirin 81 MG tablet Take 81 mg by mouth daily.  . Blood Glucose Monitoring Suppl (ACCU-CHEK NANO SMARTVIEW) W/DEVICE KIT   . cloNIDine (CATAPRES) 0.1 MG tablet Take 1 tablet (0.1 mg total) by mouth 2 (two) times daily.  . diclofenac sodium (VOLTAREN) 1 % GEL Apply dime size  three times a day as needed, to affected areas  . fenofibrate (TRICOR) 48 MG tablet Take 1 tablet (48 mg total) by mouth daily.  . fish oil-omega-3 fatty acids 1000 MG capsule Take 2 g by mouth daily.  . insulin glargine (LANTUS) 100 UNIT/ML injection INJECT 18 UNITS SUBCUTANEOUSLY AT BEDTIME  . lisinopril-hydrochlorothiazide (PRINZIDE,ZESTORETIC) 20-12.5 MG tablet Take 2 tablets  by mouth daily.  . metoprolol succinate (TOPROL-XL) 50 MG 24 hr tablet Take 1 tablet (50 mg total) by mouth daily. Take with or immediately following a meal.  . NOVOLIN R RELION 100 UNIT/ML injection INJECT(0.04-0.06 MLS) 4-6 UNITS SUBCUTANEOUSLY THREE TIMES DAILY BEFORE MEAL(S)  . oxyCODONE-acetaminophen (PERCOCET) 5-325 MG tablet Take 1 tablet by mouth every 6 (six) hours as needed for severe pain.  Marland Kitchen trolamine salicylate (ASPERCREME) 10 % cream Apply 1 application topically as needed for muscle pain.   Current Facility-Administered Medications for the 11/07/16 encounter (Office  Visit) with Carole Civil, MD  Medication  . cyclobenzaprine (FLEXERIL) tablet 5 mg      Physical Exam Physical Exam BP (!) 215/112   Pulse 69   Ht _0  (1.778 m)   Wt 173 lb (78.5 kg)   BMI 24.82 kg/m   Gen. appearance. The patient is well-developed and well-nourished, grooming and hygiene are normal. There are no gross congenital abnormalities  The patient is alert and oriented to person place and time  Mood and affect are normal  Ambulation NORMAL NO EVIDENCE OF NYSTAGMUS OR SHUFFLING   Examination reveals the following: On inspection we find tenderness over the left shoulder rotator interval lateral deltoid, left side of the cervical spine, midline is nontender. She has decreased rotation to the left normal to the right painful extension and neck flexion  Left shoulder active range of motion abduction in the scapular plane is 80, external rotation with her arm at her side 45. She has painful range of motion throughout the entire arc of motion. Internal/external rotation strength is normal she has weak abduction in the scapular plane. Skin is normal with no rash ulceration or erythema sensation is normal in the left arm she has no reflex changes has a good pulse in the radial artery    Data Reviewed X-rays were done in the office cervical spine shows degenerative disc disease between C4-6 and she has mild glenohumeral arthritis moderate acromioclavicular arthritis  Assessment    Encounter Diagnoses  Name Primary?  . Neck pain Yes  . Bursitis of left shoulder   . Cervical spondylosis without myelopathy        Plan    START DICLOFENAC 50 MG BID AND we gave her an intramuscular shot 80 mg of Depo-Medrol  IM SHOT LEFT HIP   Medication  Depo-Medrol 80 MG   Lidocaine 1% 3 mL  Verbal consent was obtained   timeout was executed to confirm injection site  Alcohol and ethyl chloride were used and the nurse injected the LEFT hip      RETURN IN 2 WEEKS    Arther Abbott 11/07/2016, 9:26 AM

## 2016-11-07 NOTE — Telephone Encounter (Signed)
Patient called to inquire about the 2 medications prescribed at today's office visi - states they are not at [place]:   cyclobenzaprine (FLEXERIL) tablet 5 mg   diclofenac (CATAFLAM) 50 MG tablet    Sig: Take 1 tablet (50 mg total) by mouth 2 (two) times daily.    Dispense:  60 tablet    Refill:  2

## 2016-11-07 NOTE — Telephone Encounter (Signed)
I DO NOT SEE FLEXERIL WAS SENT, DID YOU WANT HER TO HAVE THAT?

## 2016-11-07 NOTE — Patient Instructions (Addendum)
Encounter Diagnoses  Name Primary?  . Neck pain Yes  . Bursitis of left shoulder   . Cervical spondylosis without myelopathy    Pick up these meds from pharmacy   Meds ordered this encounter  Medications  . cyclobenzaprine (FLEXERIL) tablet 5 mg  . diclofenac (CATAFLAM) 50 MG tablet    Sig: Take 1 tablet (50 mg total) by mouth 2 (two) times daily.    Dispense:  60 tablet    Refill:  2    DO THE EXERCISES LISTED ON THE SHEET FOR THE SHOULDER   APPLY HEAT TO LEFT SHOULDER AND NECK FOR 20 MIN AT A TIME 3-4 X A DAY

## 2016-11-15 ENCOUNTER — Encounter: Payer: Self-pay | Admitting: Internal Medicine

## 2016-11-15 ENCOUNTER — Ambulatory Visit (INDEPENDENT_AMBULATORY_CARE_PROVIDER_SITE_OTHER): Payer: Commercial Managed Care - HMO | Admitting: Internal Medicine

## 2016-11-15 VITALS — BP 162/90 | HR 71 | Wt 170.0 lb

## 2016-11-15 DIAGNOSIS — E1122 Type 2 diabetes mellitus with diabetic chronic kidney disease: Secondary | ICD-10-CM | POA: Diagnosis not present

## 2016-11-15 DIAGNOSIS — E0822 Diabetes mellitus due to underlying condition with diabetic chronic kidney disease: Secondary | ICD-10-CM

## 2016-11-15 DIAGNOSIS — Z794 Long term (current) use of insulin: Secondary | ICD-10-CM

## 2016-11-15 DIAGNOSIS — N183 Chronic kidney disease, stage 3 (moderate): Secondary | ICD-10-CM

## 2016-11-15 LAB — POCT GLYCOSYLATED HEMOGLOBIN (HGB A1C): Hemoglobin A1C: 8.2

## 2016-11-15 MED ORDER — INSULIN GLARGINE 100 UNIT/ML ~~LOC~~ SOLN: SUBCUTANEOUS | 11 refills | Status: DC

## 2016-11-15 NOTE — Progress Notes (Addendum)
Patient ID: Amanda Krueger, female   DOB: 1946-09-15, 70 y.o.   MRN: [id number]  HPI: EARLY ORD is a 70 y.o.-year-old female, returning for f/u for DM2, dx in 1990s, insulin-dependent since the 1990s, uncontrolled, with complications (CKD stage 3, DR). Last visit 3 mo ago.  She has more hip pain since last visit >> started Diclofenac. Had an injection in hip recently.  Last hemoglobin A1c was:  08/15/2016: HbA1c calculated from the fructosamine is great, at 6.7%! Lab Results  Component Value Date   HGBA1C 10.2 (H) 06/16/2016   HGBA1C 9.0 (H) 07/12/2015   HGBA1C 10.9 (H) 02/19/2015  She had knee sx in 01/2014 >> since then, she has a lot of pain. She also lost weight 20 lbs. She is unhappy with her weight, would like to gain weight back.  Pt is on a regimen of: - Lantus 18 units in am - moved it back to bedtime! - R insulin: - 4 units before a smaller meal - 6 units before a larger meal  Pt checks her sugars 3x a day and they are (no log, no meter): - am: 150-170 >> 73-125, 140 >> 68, 78-109 >> 68-88 >> 68, 75-109, 115 - 2h after b'fast: n/c - before lunch: n/c >> 93-157, 173 >> 97-163, 224x1 >> 110s >> 92-131, 224 - 2h after lunch: can have 300s >> n/c - before dinner: n/c >> 110-207 >> 108-159, 207 >>  150s >> 108, 136-175, 187 - 2h after dinner: n/c - bedtime: n/c >> 144-204 >> n/c - nighttime: n/c No lows. Lowest sugar was 67 >> 68x1 >> 68; she has hypoglycemia awareness at 70.  Highest sugar was 300s >> 224 >> 150 >> 224  Glucometer: AccuChek  Pt's meals are: - Breakfast: [place] bacon; egg; oatmeal; toast and coffee - Lunch: spaghetti, soup, salad - Dinner: chicken, [place], [employer] research officer, government. - Snacks: 2-3  - + CKD stage 3, last BUN/creatinine:  Lab Results  Component Value Date   BUN 25 10/20/2016   CREATININE 1.77 (H) 10/20/2016   - last set of lipids: Lab Results  Component Value Date   CHOL 266 (H) 06/16/2016   HDL 87 06/16/2016   LDLCALC 152 (H) 06/16/2016    TRIG 135 06/16/2016   CHOLHDL 3.1 06/16/2016  On Crestor 10. - last eye exam was in 2017. Had cataract Sx OU. + DR.  - + numbness and tingling in her feet. + calluses on feet. Has a podiatrist.  ROS: Constitutional: no weight gain or loss, no fatigue, no subjective hyperthermia/hypothermia, no excessive urination and nocturia Eyes: no blurry vision, no xerophthalmia ENT: no sore throat, no nodules palpated in throat, no dysphagia/odynophagia, no hoarseness Cardiovascular: no CP/SOB/palpitations/ leg swelling  Respiratory: no cough/SOB Gastrointestinal: no N/V/D/C Musculoskeletal: no muscle/+ joint aches Skin: no rashes Neurological: no tremors/numbness/tingling/dizziness  I reviewed pt's medications, allergies, PMH, social hx, family hx, and changes were documented in the history of present illness. Otherwise, unchanged from my initial visit note.  Past Medical History:  Diagnosis Date  . Diabetes mellitus   . DJD (degenerative joint disease)   . Hyperlipidemia   . Hypertension    Past Surgical History:  Procedure Laterality Date  .  rt toe debridement    . CATARACT EXTRACTION, BILATERAL     right eye-2007, left eye-2012  . EXAM UNDER ANESTHESIA WITH MANIPULATION OF KNEE Left 12/26/2013   Procedure: EXAM UNDER ANESTHESIA WITH MANIPULATION OF KNEE;  Surgeon: Carole Civil, MD;  Location: [facility];  Service: Orthopedics;  Laterality: Left;  . KNEE ARTHROSCOPY Left 12/26/2013   Procedure: ARTHROSCOPY KNEE;  Surgeon: Carole Civil, MD;  Location: [facility];  Service: Orthopedics;  Laterality: Left;  . right toe     Great toe  . SYNOVECTOMY Left 12/26/2013   Procedure: EXTENSIVE SYNOVECTOMY LEFT KNEE;  Surgeon: Carole Civil, MD;  Location: [facility];  Service: Orthopedics;  Laterality: Left;  . WOUND DEBRIDEMENT Left 12/26/2013   Procedure: LEFT GREAT TOE CALLOUS DEBRIDEMENT;  Surgeon: Carole Civil, MD;  Location: [facility];  Service: Orthopedics;  Laterality: Left;   History    Social History  . Marital Status: Divorced    Spouse Name: N/A  . Number of Children: 3   Occupational History  . Retired - disability.   Social History Main Topics  . Smoking status: Never Smoker   . Smokeless tobacco: Not on file  . Alcohol Use: No  . Drug Use: No   Current Outpatient Prescriptions on File Prior to Visit  Medication Sig Dispense Refill  . ACCU-CHEK FASTCLIX LANCETS MISC 1 each 3 (three) times daily.     Marland Kitchen ACCU-CHEK SMARTVIEW test strip 1 each by Other route 3 (three) times daily.     Marland Kitchen aspirin 81 MG tablet Take 81 mg by mouth daily.    . Blood Glucose Monitoring Suppl (ACCU-CHEK NANO SMARTVIEW) W/DEVICE KIT     . cloNIDine (CATAPRES) 0.1 MG tablet Take 1 tablet (0.1 mg total) by mouth 2 (two) times daily. 60 tablet 3  . cyclobenzaprine (FLEXERIL) 5 MG tablet Take 1 tablet (5 mg total) by mouth 3 (three) times daily as needed for muscle spasms. 60 tablet 0  . diclofenac (CATAFLAM) 50 MG tablet Take 1 tablet (50 mg total) by mouth 2 (two) times daily. 60 tablet 2  . diclofenac sodium (VOLTAREN) 1 % GEL Apply dime size  three times a day as needed, to affected areas 1 Tube 3  . fenofibrate (TRICOR) 48 MG tablet Take 1 tablet (48 mg total) by mouth daily. 30 tablet 3  . fish oil-omega-3 fatty acids 1000 MG capsule Take 2 g by mouth daily.    . insulin glargine (LANTUS) 100 UNIT/ML injection INJECT 18 UNITS SUBCUTANEOUSLY AT BEDTIME 10 mL 11  . lisinopril-hydrochlorothiazide (PRINZIDE,ZESTORETIC) 20-12.5 MG tablet Take 2 tablets by mouth daily. 180 tablet 1  . metoprolol succinate (TOPROL-XL) 50 MG 24 hr tablet Take 1 tablet (50 mg total) by mouth daily. Take with or immediately following a meal. 90 tablet 3  . NOVOLIN R RELION 100 UNIT/ML injection INJECT(0.04-0.06 MLS) 4-6 UNITS SUBCUTANEOUSLY THREE TIMES DAILY BEFORE MEAL(S) 10 mL 2  . oxyCODONE-acetaminophen (PERCOCET) 5-325 MG tablet Take 1 tablet by mouth every 6 (six) hours as needed for severe pain. 60 tablet 0   . trolamine salicylate (ASPERCREME) 10 % cream Apply 1 application topically as needed for muscle pain.     Current Facility-Administered Medications on File Prior to Visit  Medication Dose Route Frequency Provider Last Rate Last Dose  . cyclobenzaprine (FLEXERIL) tablet 5 mg  5 mg Oral TID PRN Carole Civil, MD       Allergies  Allergen Reactions  . Statins    Family History  Problem Relation Age of Onset  . Diabetes Mother   . Stroke Father   . Heart disease Father   . Diabetes Sister   . Diabetes Brother   . Diabetes Sister    PE: BP (!) 162/90   Pulse 71  Wt 170 lb (77.1 kg)   SpO2 99%   BMI 24.39 kg/m  Body mass index is 24.39 kg/m. Wt Readings from Last 3 Encounters:  11/15/16 170 lb (77.1 kg)  11/07/16 173 lb (78.5 kg)  10/20/16 175 lb (79.4 kg)   Constitutional: slightly overweight, in NAD Eyes: PERRLA, EOMI, no exophthalmos ENT: moist mucous membranes, no thyromegaly, no cervical lymphadenopathy Cardiovascular: RRR, No MRG Respiratory: CTA B Gastrointestinal: abdomen soft, NT, ND, BS+ Musculoskeletal: no deformities, strength intact in all 4 Skin: moist, warm, no rashes Neurological: no tremor with outstretched hands, DTR normal in all 4  ASSESSMENT: 1. DM2, insulin-dependent, uncontrolled, with complications - CKD stage 3 - DR  PLAN:  1. Patient with long-standing, uncontrolled diabetes, on long acting insulin + Regular insulin. Before last visit, she had stopped the insulin since last visit as she felt she was gaining weight. Sugars started to improve after she added it back. At last visit, we moved the Lantus dose in am (however, she went back to HS dosing since) and adjusted the mealtime R insulin.  - At last visit her sugars in the log were not correlating with the high HbA1c so we checked a fructosamine level. This returned at goal, at 6.7%, and past discrepancy with her A1c. A new HbA1c today was lower, at 8.2%, but we'll check another  fructosamine level. - Sugars are still low in am >> will again advise her to move the Lantus in am. Will also increase the Lunchtime R insulin as her sugars before dinner are higher - I suggested to:  Patient Instructions  Please continue: - Lantus 18 units but move it to am  - R insulin: Breakfast: - 4 units before a smaller meal - 6 units before a larger meal Lunch: - 6 units before a smaller meal - 8 units before a larger meal Breakfast: - 4 units before a smaller meal - 6 units before a larger meal  Please return in 3 months with your sugar log.   - continue checking sugars at different times of the day - check 3 times a day, rotating checks - advised for yearly eye exams >> she is up-to-date >> will need the record - Return to clinic in 3 mo with sugar log   Office Visit on 11/15/2016  Component Date Value Ref Range Status  . Fructosamine 11/17/2016 365* 190 - 270 umol/L Final  . Hemoglobin A1C 11/15/2016 8.2   Final   HbA1c calculated from the fructosamine is 7.8%.  Philemon Kingdom, MD PhD [facility]

## 2016-11-15 NOTE — Patient Instructions (Addendum)
Please continue: - Lantus 18 units but move it to am  - R insulin: Breakfast: - 4 units before a smaller meal - 6 units before a larger meal Lunch: - 6 units before a smaller meal - 8 units before a larger meal Breakfast: - 4 units before a smaller meal - 6 units before a larger meal  Please return in 3 months with your sugar log.

## 2016-11-17 LAB — FRUCTOSAMINE: FRUCTOSAMINE: 365 umol/L — AB (ref 190–270)

## 2016-11-20 ENCOUNTER — Telehealth: Payer: Self-pay | Admitting: Internal Medicine

## 2016-11-20 NOTE — Telephone Encounter (Signed)
Pt called and said she was returning a phone call from our office.

## 2016-11-24 ENCOUNTER — Ambulatory Visit: Payer: Commercial Managed Care - HMO | Admitting: Orthopedic Surgery

## 2016-12-01 ENCOUNTER — Ambulatory Visit (INDEPENDENT_AMBULATORY_CARE_PROVIDER_SITE_OTHER): Payer: Commercial Managed Care - HMO | Admitting: Orthopedic Surgery

## 2016-12-01 ENCOUNTER — Encounter: Payer: Self-pay | Admitting: Orthopedic Surgery

## 2016-12-01 DIAGNOSIS — M7552 Bursitis of left shoulder: Secondary | ICD-10-CM

## 2016-12-01 DIAGNOSIS — M542 Cervicalgia: Secondary | ICD-10-CM | POA: Diagnosis not present

## 2016-12-01 NOTE — Progress Notes (Signed)
Patient ID: Amanda Krueger, female   DOB: 1946-04-27, 70 y.o.   MRN: [id number]  Chief Complaint  Patient presents with  . Follow-up    neck and shoulder pain    HPI Amanda Krueger is a 70 y.o. female.   HPI 70 year old female with neck and shoulder pain presents for follow-up. We gave her diclofenac 50 twice a day and an intramuscular injection of 80 mg of Depo-Medrol she reports that her neck and shoulder have both improved  Review of Systems Review of Systems Normal neuro  Denies fever  Examination There were no vitals taken for this visit.  Gen. appearance the patient's appearance is normal with normal grooming and  hygiene The patient is oriented to person place and time Mood and affect are normal   Ortho Exam  Medical decision-making Diagnosis, Data, Plan (risk)  Encounter Diagnoses  Name Primary?  . Neck pain Yes  . Bursitis of left shoulder    Follow-up as needed  Arther Abbott, MD 12/01/2016 10:23 AM

## 2016-12-25 ENCOUNTER — Ambulatory Visit: Payer: Commercial Managed Care - HMO | Admitting: Podiatry

## 2016-12-25 ENCOUNTER — Ambulatory Visit: Payer: Commercial Managed Care - HMO

## 2017-01-12 ENCOUNTER — Ambulatory Visit (INDEPENDENT_AMBULATORY_CARE_PROVIDER_SITE_OTHER): Payer: Medicare PPO | Admitting: Podiatry

## 2017-01-12 ENCOUNTER — Encounter: Payer: Self-pay | Admitting: Podiatry

## 2017-01-12 DIAGNOSIS — M79676 Pain in unspecified toe(s): Secondary | ICD-10-CM

## 2017-01-12 DIAGNOSIS — Q828 Other specified congenital malformations of skin: Secondary | ICD-10-CM

## 2017-01-12 DIAGNOSIS — E1140 Type 2 diabetes mellitus with diabetic neuropathy, unspecified: Secondary | ICD-10-CM

## 2017-01-12 DIAGNOSIS — B351 Tinea unguium: Secondary | ICD-10-CM

## 2017-01-15 ENCOUNTER — Emergency Department (HOSPITAL_COMMUNITY): Payer: Medicare PPO

## 2017-01-15 ENCOUNTER — Inpatient Hospital Stay (HOSPITAL_COMMUNITY)
Admission: EM | Admit: 2017-01-15 | Discharge: 2017-01-18 | DRG: 064 | Disposition: A | Discharge status: Home / Self Care | Payer: Medicare PPO | Attending: Family Medicine | Admitting: Family Medicine

## 2017-01-15 ENCOUNTER — Telehealth: Payer: Self-pay | Admitting: Family Medicine

## 2017-01-15 ENCOUNTER — Encounter (HOSPITAL_COMMUNITY): Payer: Self-pay | Admitting: Emergency Medicine

## 2017-01-15 DIAGNOSIS — E1365 Other specified diabetes mellitus with hyperglycemia: Secondary | ICD-10-CM

## 2017-01-15 DIAGNOSIS — I129 Hypertensive chronic kidney disease with stage 1 through stage 4 chronic kidney disease, or unspecified chronic kidney disease: Secondary | ICD-10-CM | POA: Diagnosis present

## 2017-01-15 DIAGNOSIS — N184 Chronic kidney disease, stage 4 (severe): Secondary | ICD-10-CM | POA: Diagnosis present

## 2017-01-15 DIAGNOSIS — Z791 Long term (current) use of non-steroidal anti-inflammatories (NSAID): Secondary | ICD-10-CM

## 2017-01-15 DIAGNOSIS — E785 Hyperlipidemia, unspecified: Secondary | ICD-10-CM | POA: Diagnosis present

## 2017-01-15 DIAGNOSIS — N183 Chronic kidney disease, stage 3 (moderate): Secondary | ICD-10-CM | POA: Diagnosis present

## 2017-01-15 DIAGNOSIS — E0822 Diabetes mellitus due to underlying condition with diabetic chronic kidney disease: Secondary | ICD-10-CM | POA: Diagnosis not present

## 2017-01-15 DIAGNOSIS — Z79891 Long term (current) use of opiate analgesic: Secondary | ICD-10-CM

## 2017-01-15 DIAGNOSIS — R531 Weakness: Secondary | ICD-10-CM

## 2017-01-15 DIAGNOSIS — IMO0002 Reserved for concepts with insufficient information to code with codable children: Secondary | ICD-10-CM

## 2017-01-15 DIAGNOSIS — Z8249 Family history of ischemic heart disease and other diseases of the circulatory system: Secondary | ICD-10-CM | POA: Diagnosis not present

## 2017-01-15 DIAGNOSIS — Z833 Family history of diabetes mellitus: Secondary | ICD-10-CM | POA: Diagnosis not present

## 2017-01-15 DIAGNOSIS — Z9114 Patient's other noncompliance with medication regimen: Secondary | ICD-10-CM

## 2017-01-15 DIAGNOSIS — E1322 Other specified diabetes mellitus with diabetic chronic kidney disease: Secondary | ICD-10-CM

## 2017-01-15 DIAGNOSIS — Z7982 Long term (current) use of aspirin: Secondary | ICD-10-CM | POA: Diagnosis not present

## 2017-01-15 DIAGNOSIS — I10 Essential (primary) hypertension: Secondary | ICD-10-CM | POA: Diagnosis present

## 2017-01-15 DIAGNOSIS — G8929 Other chronic pain: Secondary | ICD-10-CM | POA: Diagnosis present

## 2017-01-15 DIAGNOSIS — I6789 Other cerebrovascular disease: Secondary | ICD-10-CM | POA: Diagnosis not present

## 2017-01-15 DIAGNOSIS — Z823 Family history of stroke: Secondary | ICD-10-CM

## 2017-01-15 DIAGNOSIS — E1122 Type 2 diabetes mellitus with diabetic chronic kidney disease: Secondary | ICD-10-CM | POA: Diagnosis present

## 2017-01-15 DIAGNOSIS — N185 Chronic kidney disease, stage 5: Secondary | ICD-10-CM | POA: Diagnosis present

## 2017-01-15 DIAGNOSIS — G9349 Other encephalopathy: Secondary | ICD-10-CM | POA: Diagnosis present

## 2017-01-15 DIAGNOSIS — Z9119 Patient's noncompliance with other medical treatment and regimen: Secondary | ICD-10-CM

## 2017-01-15 DIAGNOSIS — Z79899 Other long term (current) drug therapy: Secondary | ICD-10-CM

## 2017-01-15 DIAGNOSIS — R200 Anesthesia of skin: Secondary | ICD-10-CM | POA: Diagnosis present

## 2017-01-15 DIAGNOSIS — M7552 Bursitis of left shoulder: Secondary | ICD-10-CM

## 2017-01-15 DIAGNOSIS — N179 Acute kidney failure, unspecified: Secondary | ICD-10-CM | POA: Diagnosis present

## 2017-01-15 DIAGNOSIS — Z8673 Personal history of transient ischemic attack (TIA), and cerebral infarction without residual deficits: Secondary | ICD-10-CM | POA: Diagnosis present

## 2017-01-15 DIAGNOSIS — M6281 Muscle weakness (generalized): Secondary | ICD-10-CM

## 2017-01-15 DIAGNOSIS — M25562 Pain in left knee: Secondary | ICD-10-CM | POA: Diagnosis present

## 2017-01-15 DIAGNOSIS — Z888 Allergy status to other drugs, medicaments and biological substances status: Secondary | ICD-10-CM | POA: Diagnosis not present

## 2017-01-15 DIAGNOSIS — Z794 Long term (current) use of insulin: Secondary | ICD-10-CM

## 2017-01-15 DIAGNOSIS — M542 Cervicalgia: Secondary | ICD-10-CM

## 2017-01-15 DIAGNOSIS — R260 Ataxic gait: Secondary | ICD-10-CM

## 2017-01-15 DIAGNOSIS — I639 Cerebral infarction, unspecified: Principal | ICD-10-CM | POA: Diagnosis present

## 2017-01-15 DIAGNOSIS — M47812 Spondylosis without myelopathy or radiculopathy, cervical region: Secondary | ICD-10-CM

## 2017-01-15 LAB — CBC
HCT: 40.2 % (ref 36.0–46.0)
HEMOGLOBIN: 13.6 g/dL (ref 12.0–15.0)
MCH: 29.1 pg (ref 26.0–34.0)
MCHC: 33.8 g/dL (ref 30.0–36.0)
MCV: 86.1 fL (ref 78.0–100.0)
Platelets: 212 10*3/uL (ref 150–400)
RBC: 4.67 MIL/uL (ref 3.87–5.11)
RDW: 13.3 % (ref 11.5–15.5)
WBC: 6.4 10*3/uL (ref 4.0–10.5)

## 2017-01-15 LAB — URINALYSIS, ROUTINE W REFLEX MICROSCOPIC
BILIRUBIN URINE: NEGATIVE
Glucose, UA: 150 mg/dL — AB
KETONES UR: NEGATIVE mg/dL
NITRITE: NEGATIVE
PROTEIN: 100 mg/dL — AB
SPECIFIC GRAVITY, URINE: 1.009 (ref 1.005–1.030)
pH: 7 (ref 5.0–8.0)

## 2017-01-15 LAB — GLUCOSE, CAPILLARY: GLUCOSE-CAPILLARY: 200 mg/dL — AB (ref 65–99)

## 2017-01-15 LAB — BASIC METABOLIC PANEL
ANION GAP: 7 (ref 5–15)
BUN: 24 mg/dL — ABNORMAL HIGH (ref 6–20)
CALCIUM: 9.2 mg/dL (ref 8.9–10.3)
CO2: 30 mmol/L (ref 22–32)
Chloride: 97 mmol/L — ABNORMAL LOW (ref 101–111)
Creatinine, Ser: 1.52 mg/dL — ABNORMAL HIGH (ref 0.44–1.00)
GFR calc non Af Amer: 34 mL/min — ABNORMAL LOW (ref >60)
GFR, EST AFRICAN AMERICAN: 39 mL/min — AB (ref >60)
Glucose, Bld: 260 mg/dL — ABNORMAL HIGH (ref 65–99)
Potassium: 4 mmol/L (ref 3.5–5.1)
SODIUM: 134 mmol/L — AB (ref 135–145)

## 2017-01-15 MED ORDER — ENOXAPARIN SODIUM 40 MG/0.4ML ~~LOC~~ SOLN
40.0000 mg | SUBCUTANEOUS | Status: DC
  Administered 2017-01-15: 40 mg via SUBCUTANEOUS
  Filled 2017-01-15: qty 0.4

## 2017-01-15 MED ORDER — ASPIRIN 325 MG PO TABS
325.0000 mg | ORAL_TABLET | Freq: Every day | ORAL | Status: DC
Start: 2017-01-15 — End: 2017-01-18
  Administered 2017-01-15 – 2017-01-18 (×4): 325 mg via ORAL
  Filled 2017-01-15 (×4): qty 1

## 2017-01-15 MED ORDER — INSULIN GLARGINE 100 UNIT/ML ~~LOC~~ SOLN
18.0000 [IU] | Freq: Every day | SUBCUTANEOUS | Status: DC
  Administered 2017-01-16 – 2017-01-18 (×3): 18 [IU] via SUBCUTANEOUS
  Filled 2017-01-15 (×4): qty 0.18

## 2017-01-15 MED ORDER — HYDROCHLOROTHIAZIDE 25 MG PO TABS: 25.0000 mg | ORAL_TABLET | Freq: Every day | ORAL | Status: DC

## 2017-01-15 MED ORDER — ACETAMINOPHEN 325 MG PO TABS: 650.0000 mg | ORAL_TABLET | ORAL | Status: DC | PRN

## 2017-01-15 MED ORDER — ACETAMINOPHEN 650 MG RE SUPP: 650.0000 mg | RECTAL | Status: DC | PRN

## 2017-01-15 MED ORDER — ACETAMINOPHEN 160 MG/5ML PO SOLN: 650.0000 mg | ORAL | Status: DC | PRN

## 2017-01-15 MED ORDER — DICLOFENAC SODIUM 1 % TD GEL
2.0000 g | Freq: Four times a day (QID) | TRANSDERMAL | Status: DC | PRN
  Filled 2017-01-15: qty 100

## 2017-01-15 MED ORDER — METOPROLOL SUCCINATE ER 50 MG PO TB24
50.0000 mg | ORAL_TABLET | Freq: Every day | ORAL | Status: DC
  Administered 2017-01-16 – 2017-01-18 (×3): 50 mg via ORAL
  Filled 2017-01-15 (×4): qty 1

## 2017-01-15 MED ORDER — LISINOPRIL-HYDROCHLOROTHIAZIDE 20-12.5 MG PO TABS: 2.0000 | ORAL_TABLET | Freq: Every day | ORAL | Status: DC

## 2017-01-15 MED ORDER — CLONIDINE HCL 0.1 MG PO TABS
0.1000 mg | ORAL_TABLET | Freq: Two times a day (BID) | ORAL | Status: DC
  Administered 2017-01-15 – 2017-01-18 (×6): 0.1 mg via ORAL
  Filled 2017-01-15 (×6): qty 1

## 2017-01-15 MED ORDER — OXYCODONE-ACETAMINOPHEN 5-325 MG PO TABS
1.0000 | ORAL_TABLET | Freq: Four times a day (QID) | ORAL | Status: DC | PRN
  Administered 2017-01-15 – 2017-01-18 (×5): 1 via ORAL
  Filled 2017-01-15 (×5): qty 1

## 2017-01-15 MED ORDER — INSULIN ASPART 100 UNIT/ML ~~LOC~~ SOLN
0.0000 [IU] | Freq: Three times a day (TID) | SUBCUTANEOUS | Status: DC
  Administered 2017-01-16 (×2): 3 [IU] via SUBCUTANEOUS
  Administered 2017-01-17: 2 [IU] via SUBCUTANEOUS
  Administered 2017-01-17: 3 [IU] via SUBCUTANEOUS
  Administered 2017-01-18: 2 [IU] via SUBCUTANEOUS

## 2017-01-15 MED ORDER — INSULIN ASPART 100 UNIT/ML ~~LOC~~ SOLN
0.0000 [IU] | Freq: Every day | SUBCUTANEOUS | Status: DC
Start: 2017-01-15 — End: 2017-01-18
  Administered 2017-01-16: 2 [IU] via SUBCUTANEOUS

## 2017-01-15 MED ORDER — ASPIRIN 300 MG RE SUPP: 300.0000 mg | Freq: Every day | RECTAL | Status: DC

## 2017-01-15 MED ORDER — LISINOPRIL 10 MG PO TABS: 40.0000 mg | ORAL_TABLET | Freq: Every day | ORAL | Status: DC

## 2017-01-15 MED ORDER — SENNOSIDES-DOCUSATE SODIUM 8.6-50 MG PO TABS: 1.0000 | ORAL_TABLET | Freq: Every evening | ORAL | Status: DC | PRN

## 2017-01-15 MED ORDER — CYCLOBENZAPRINE HCL 10 MG PO TABS
5.0000 mg | ORAL_TABLET | Freq: Three times a day (TID) | ORAL | Status: DC | PRN
  Administered 2017-01-16: 5 mg via ORAL
  Filled 2017-01-15: qty 1

## 2017-01-15 MED ORDER — STROKE: EARLY STAGES OF RECOVERY BOOK
Freq: Once | Status: AC
  Administered 2017-01-16: 1
  Filled 2017-01-15: qty 1

## 2017-01-15 NOTE — ED Notes (Signed)
Ambulated pt with minimal one person assist. Pt a little unsteady on his feet, but did well overall.

## 2017-01-15 NOTE — H&P (Signed)
History and Physical    Amanda Krueger MRN:[id number] DOB: 10/13/1946 DOA: 01/15/2017  PCP: [place], KAWANTA, MD  Patient coming from: home  Chief Complaint: left arm numbness  HPI: Amanda Krueger is a 70 y.o. female with medical history significant of hypertension, diabetes, presents to the hospital with complaints of right arm numbness. Patient reports that approximately 3 days ago she was lifting heavy wood out of her truck. The following day, she noticed numbness in her left hand and arm numbness. She felt this may be related to lifting wood the prior day. This progressed throughout the day where she felt numb in her entire body. She did not seek medical care at that time. The following day, overall body numbness had improved, but remained in her left upper extremity. She felt that her left arm was also somewhat weak. She has chronic pain in her left knee, but was having increasing difficulty walking, more so than normal. She denies any changes in vision, changes in speech, difficulty swallowing. Her symptoms had somewhat stabilized yesterday, but she waited until this morning to call her primary care physician. Upon calling her primary care doctor, she was advised to come to the emergency room for further evaluation.  ED Course: Patient was evaluated in the emergency room with MRI if indicated right-sided acute infarct. She was noted to be markedly hypertensive in the emergency room. Creatinine was elevated, but near baseline. She has been referred for admission.  Review of Systems: As per HPI otherwise 10 point review of systems negative.    Past Medical History:  Diagnosis Date  . Diabetes mellitus   . DJD (degenerative joint disease)   . Hyperlipidemia   . Hypertension     Past Surgical History:  Procedure Laterality Date  .  rt toe debridement    . CATARACT EXTRACTION, BILATERAL     right eye-2007, left eye-2012  . EXAM UNDER ANESTHESIA WITH MANIPULATION OF KNEE Left 12/26/2013    Procedure: EXAM UNDER ANESTHESIA WITH MANIPULATION OF KNEE;  Surgeon: Stanley E Harrison, MD;  Location: [facility];  Service: Orthopedics;  Laterality: Left;  . KNEE ARTHROSCOPY Left 12/26/2013   Procedure: ARTHROSCOPY KNEE;  Surgeon: Stanley E Harrison, MD;  Location: [facility];  Service: Orthopedics;  Laterality: Left;  . right toe     Great toe  . SYNOVECTOMY Left 12/26/2013   Procedure: EXTENSIVE SYNOVECTOMY LEFT KNEE;  Surgeon: Stanley E Harrison, MD;  Location: [facility];  Service: Orthopedics;  Laterality: Left;  . WOUND DEBRIDEMENT Left 12/26/2013   Procedure: LEFT GREAT TOE CALLOUS DEBRIDEMENT;  Surgeon: Stanley E Harrison, MD;  Location: [facility];  Service: Orthopedics;  Laterality: Left;     reports that she has never smoked. She has never used smokeless tobacco. She reports that she does not drink alcohol or use drugs.  Allergies  Allergen Reactions  . Statins Other (See Comments)    Joint pain    Family History  Problem Relation Age of Onset  . Diabetes Mother   . Stroke Father   . Heart disease Father   . Diabetes Sister   . Diabetes Brother   . Diabetes Sister     Prior to Admission medications   Medication Sig Start Date End Date Taking? Authorizing Provider  aspirin EC 81 MG tablet Take 81 mg by mouth daily.   Yes Historical Provider, MD  fish oil-omega-3 fatty acids 1000 MG capsule Take 1 g by mouth daily.    Yes Historical Provider, MD    insulin glargine (LANTUS) 100 UNIT/ML injection INJECT 18 UNITS SUBCUTANEOUSLY in am 11/15/16  Yes Philemon Kingdom, MD  lisinopril-hydrochlorothiazide (PRINZIDE,ZESTORETIC) 20-12.5 MG tablet Take 2 tablets by mouth daily. 07/19/16  Yes Alycia Rossetti, MD  metoprolol succinate (TOPROL-XL) 50 MG 24 hr tablet Take 1 tablet (50 mg total) by mouth daily. Take with or immediately following a meal. 07/19/16  Yes Alycia Rossetti, MD  NOVOLIN R RELION 100 UNIT/ML injection INJECT(0.04-0.06 MLS) 4-6 UNITS SUBCUTANEOUSLY THREE TIMES DAILY BEFORE MEAL(S)  10/31/16  Yes Philemon Kingdom, MD  ACCU-CHEK FASTCLIX LANCETS MISC 1 each 3 (three) times daily.  12/05/13   Historical Provider, MD  ACCU-CHEK SMARTVIEW test strip 1 each by Other route 3 (three) times daily.  12/05/13   Historical Provider, MD  Blood Glucose Monitoring Suppl (ACCU-CHEK NANO SMARTVIEW) W/DEVICE KIT  12/05/13   Historical Provider, MD  cloNIDine (CATAPRES) 0.1 MG tablet Take 1 tablet (0.1 mg total) by mouth 2 (two) times daily. Patient not taking: Reported on 01/15/2017 06/16/16   Alycia Rossetti, MD  cyclobenzaprine (FLEXERIL) 5 MG tablet Take 1 tablet (5 mg total) by mouth 3 (three) times daily as needed for muscle spasms. Patient not taking: Reported on 01/15/2017 11/07/16   Carole Civil, MD  diclofenac (CATAFLAM) 50 MG tablet Take 1 tablet (50 mg total) by mouth 2 (two) times daily. Patient not taking: Reported on 01/15/2017 11/07/16   Carole Civil, MD  diclofenac sodium (VOLTAREN) 1 % GEL Apply dime size  three times a day as needed, to affected areas Patient taking differently: Apply 2 g topically 4 (four) times daily as needed. Apply dime size  three times a day as needed, to affected areas(Pain) 10/23/14   Alycia Rossetti, MD  fenofibrate (TRICOR) 48 MG tablet Take 1 tablet (48 mg total) by mouth daily. Patient not taking: Reported on 01/15/2017 10/20/16   Alycia Rossetti, MD  oxyCODONE-acetaminophen (PERCOCET/ROXICET) 5-325 MG tablet Take 1 tablet by mouth every 6 (six) hours as needed for moderate pain.  10/20/16   Historical Provider, MD    Physical Exam: Vitals:   01/15/17 1300 01/15/17 1600 01/15/17 1830 01/15/17 1847  BP: 196/85 166/81 (!) 209/85 (!) 207/75  Pulse: 97 86 91 87  Resp:   16 16  Temp:   98.5 F (36.9 C) 98.7 F (37.1 C)  TempSrc:   Oral Oral  SpO2: 99% 98% 99% 100%  Weight:    77.2 kg (170 lb 4.8 oz)  Height:    5' 10" (1.778 m)      Constitutional: NAD, calm, comfortable Vitals:   01/15/17 1300 01/15/17 1600 01/15/17 1830  01/15/17 1847  BP: 196/85 166/81 (!) 209/85 (!) 207/75  Pulse: 97 86 91 87  Resp:   16 16  Temp:   98.5 F (36.9 C) 98.7 F (37.1 C)  TempSrc:   Oral Oral  SpO2: 99% 98% 99% 100%  Weight:    77.2 kg (170 lb 4.8 oz)  Height:    5' 10" (1.778 m)   Eyes: PERRL, lids and conjunctivae normal ENMT: Mucous membranes are moist. Posterior pharynx clear of any exudate or lesions.Normal dentition.  Neck: normal, supple, no masses, no thyromegaly Respiratory: clear to auscultation bilaterally, no wheezing, no crackles. Normal respiratory effort. No accessory muscle use.  Cardiovascular: Regular rate and rhythm, no murmurs / rubs / gallops. No extremity edema. 2+ pedal pulses. No carotid bruits.  Abdomen: no tenderness, no masses palpated. No hepatosplenomegaly. Bowel sounds positive.  Musculoskeletal: no clubbing / cyanosis. No joint deformity upper and lower extremities. Good ROM, no contractures. Normal muscle tone.  Skin: no rashes, lesions, ulcers. No induration Neurologic: CN 2-12 grossly intact. Sensation intact, DTR normal. Strength 5/5 in all 4.  Psychiatric: Normal judgment and insight. Alert and oriented x 3. Normal mood.    Labs on Admission: I have personally reviewed following labs and imaging studies  CBC:  Recent Labs Lab 01/15/17 1045  WBC 6.4  HGB 13.6  HCT 40.2  MCV 86.1  PLT 706   Basic Metabolic Panel:  Recent Labs Lab 01/15/17 1045  NA 134*  K 4.0  CL 97*  CO2 30  GLUCOSE 260*  BUN 24*  CREATININE 1.52*  CALCIUM 9.2   GFR: Estimated Creatinine Clearance: 37.8 mL/min (by C-G formula based on SCr of 1.52 mg/dL (H)). Liver Function Tests: No results for input(s): AST, ALT, ALKPHOS, BILITOT, PROT, ALBUMIN in the last 168 hours. No results for input(s): LIPASE, AMYLASE in the last 168 hours. No results for input(s): AMMONIA in the last 168 hours. Coagulation Profile: No results for input(s): INR, PROTIME in the last 168 hours. Cardiac Enzymes: No  results for input(s): CKTOTAL, CKMB, CKMBINDEX, TROPONINI in the last 168 hours. BNP (last 3 results) No results for input(s): PROBNP in the last 8760 hours. HbA1C: No results for input(s): HGBA1C in the last 72 hours. CBG: No results for input(s): GLUCAP in the last 168 hours. Lipid Profile: No results for input(s): CHOL, HDL, LDLCALC, TRIG, CHOLHDL, LDLDIRECT in the last 72 hours. Thyroid Function Tests: No results for input(s): TSH, T4TOTAL, FREET4, T3FREE, THYROIDAB in the last 72 hours. Anemia Panel: No results for input(s): VITAMINB12, FOLATE, FERRITIN, TIBC, IRON, RETICCTPCT in the last 72 hours. Urine analysis:    Component Value Date/Time   COLORURINE STRAW (A) 01/15/2017 1253   APPEARANCEUR CLEAR 01/15/2017 1253   LABSPEC 1.009 01/15/2017 1253   PHURINE 7.0 01/15/2017 1253   GLUCOSEU 150 (A) 01/15/2017 1253   HGBUR SMALL (A) 01/15/2017 1253   BILIRUBINUR NEGATIVE 01/15/2017 1253   KETONESUR NEGATIVE 01/15/2017 1253   PROTEINUR 100 (A) 01/15/2017 1253   NITRITE NEGATIVE 01/15/2017 1253   LEUKOCYTESUR TRACE (A) 01/15/2017 1253   Sepsis Labs: !!!!!!!!!!!!!!!!!!!!!!!!!!!!!!!!!!!!!!!!!!!! _0 (procalcitonin:4,lacticidven:4) )No results found for this or any previous visit (from the past 240 hour(s)).   Radiological Exams on Admission: Mr Virgel Paling CB Contrast  Result Date: 01/15/2017 CLINICAL DATA:  Left-sided weakness over the last 2 days. EXAM: MRI HEAD WITHOUT CONTRAST MRA HEAD WITHOUT CONTRAST TECHNIQUE: Multiplanar, multiecho pulse sequences of the brain and surrounding structures were obtained without intravenous contrast. Angiographic images of the head were obtained using MRA technique without contrast. COMPARISON:  None. FINDINGS: MRI HEAD FINDINGS Brain: Diffusion imaging shows a 7 mm acute infarction in the right lateral thalamus. Punctate acute or subacute infarction in the deep white matter adjacent to the posterior body of the left lateral ventricle. No  other acute finding. The brainstem and cerebellum are normal. There are old small vessel infarctions affecting the thalami, basal ganglia and hemispheric deep white matter. Old right frontal cortical/ subcortical infarction. Alternatively, this could be gliosis related to a developmental venous anomaly. No sign of mass lesion, acute hemorrhage, hydrocephalus or extra-axial collection. Some hemosiderin deposition associated with left hemisphere radiating white matter old infarction. Vascular: Major vessels at the base of the brain show flow. Skull and upper cervical spine: Negative Sinuses/Orbits: Clear/normal Other: None significant MRA HEAD FINDINGS Both internal carotid arteries are widely  patent into the brain. No siphon stenosis. The anterior and middle cerebral vessels are patent without proximal stenosis, aneurysm or vascular malformation. Distal vessels may show mild atherosclerotic irregularity. Both vertebral arteries are widely patent to the basilar. No basilar stenosis. Posterior circulation branch vessels appear normal. IMPRESSION: Acute 7 mm infarction right lateral thalamus. Possible acute 2 mm infarction in the deep white matter adjacent to the posterior body of the left lateral ventricle. Extensive chronic small vessel ischemic changes elsewhere throughout the brain including old the laminae ankle occur infarctions in an old hemorrhagic infarction of the radiating white matter tracts on the left. Gliosis in the right frontal subcortical white matter that could be an old infarction or gliosis associated with a developmental venous anomaly. Electronically Signed   By: Mark  Shogry M.D.   On: 01/15/2017 12:54   Mr Brain Wo Contrast  Result Date: 01/15/2017 CLINICAL DATA:  Left-sided weakness over the last 2 days. EXAM: MRI HEAD WITHOUT CONTRAST MRA HEAD WITHOUT CONTRAST TECHNIQUE: Multiplanar, multiecho pulse sequences of the brain and surrounding structures were obtained without intravenous  contrast. Angiographic images of the head were obtained using MRA technique without contrast. COMPARISON:  None. FINDINGS: MRI HEAD FINDINGS Brain: Diffusion imaging shows a 7 mm acute infarction in the right lateral thalamus. Punctate acute or subacute infarction in the deep white matter adjacent to the posterior body of the left lateral ventricle. No other acute finding. The brainstem and cerebellum are normal. There are old small vessel infarctions affecting the thalami, basal ganglia and hemispheric deep white matter. Old right frontal cortical/ subcortical infarction. Alternatively, this could be gliosis related to a developmental venous anomaly. No sign of mass lesion, acute hemorrhage, hydrocephalus or extra-axial collection. Some hemosiderin deposition associated with left hemisphere radiating white matter old infarction. Vascular: Major vessels at the base of the brain show flow. Skull and upper cervical spine: Negative Sinuses/Orbits: Clear/normal Other: None significant MRA HEAD FINDINGS Both internal carotid arteries are widely patent into the brain. No siphon stenosis. The anterior and middle cerebral vessels are patent without proximal stenosis, aneurysm or vascular malformation. Distal vessels may show mild atherosclerotic irregularity. Both vertebral arteries are widely patent to the basilar. No basilar stenosis. Posterior circulation branch vessels appear normal. IMPRESSION: Acute 7 mm infarction right lateral thalamus. Possible acute 2 mm infarction in the deep white matter adjacent to the posterior body of the left lateral ventricle. Extensive chronic small vessel ischemic changes elsewhere throughout the brain including old the laminae ankle occur infarctions in an old hemorrhagic infarction of the radiating white matter tracts on the left. Gliosis in the right frontal subcortical white matter that could be an old infarction or gliosis associated with a developmental venous anomaly.  Electronically Signed   By: Mark  Shogry M.D.   On: 01/15/2017 12:54    EKG: Independently reviewed. Sinus rhythm without acute changes  Assessment/Plan Active Problems:   HTN (hypertension)   CKD stage 3 due to type 2 diabetes mellitus (HCC)   Diabetes mellitus due to underlying condition with stage 3 chronic kidney disease, with long-term current use of insulin (HCC)   Stroke (HCC)   Left arm numbness   Stroke (cerebrum) (HCC)    1. Acute CVA. Likely related to elevated blood pressure. Patient was taking aspirin in the past, but had not done so in quite some time. We'll restart her on aspirin. Further stroke workup with carotid Dopplers, echocardiogram, check lipid panel and A1c. Neurology consultation. She's not a candidate for TPA   due to delay in arrival.  2. Hypertension. Patient does have a history of noncompliance. Restart her metoprolol, lisinopril. She is also taking clonidine which will be restarted. She is more than 48 hours from onset of stroke.  3. Diabetes. Continue Lantus and sliding scale insulin. Check A1c  4. CKD stage III. Creatinine appears to be near baseline. Continue to follow.  5. Left arm numbness. Physical therapy and occupational therapy.   DVT prophylaxis: lovenox Code Status: full Family Communication: discussed with daughter at the bedside Disposition Plan: discharge home once work up complete Consults called: neurology Admission status: inpatient, telemetry   Curtis Uriarte MD [facility] Pager [phone]  If 7PM-7AM, please contact night-coverage [email or web address] Password [facility]  01/15/2017, 6:49 PM

## 2017-01-15 NOTE — ED Notes (Signed)
Patient transported to CT 

## 2017-01-15 NOTE — ED Triage Notes (Addendum)
PT states left hand numbness and weakness, and left leg weakness with difficulty with ambulation that started Saturday morning (01/13/17). PT also add that her left side of face feels numb as well.

## 2017-01-15 NOTE — Telephone Encounter (Signed)
Agree with above 

## 2017-01-15 NOTE — Telephone Encounter (Signed)
Pt called, left message on front desk phone.  Started with numbness in hand Saturday and then said whole body was numb.  Today still with numbness and esp on left side and leg.  Says she can hardly stand or walk.  Advised her to have someone take her to the ED right now.

## 2017-01-15 NOTE — ED Provider Notes (Signed)
[facility] DEPT Provider Note   CSN: [id number] Arrival date & time: 01/15/17  1028     History   Chief Complaint Chief Complaint  Patient presents with  . Extremity Weakness    HPI Amanda Krueger is a 71 y.o. female.  HPI Patient is a 71 year old female with a medical history of hypertension, diabetes, CKD stage III and hyperlipidemia who presents with left hand and extremity numbness and weakness. Patient states that she had intermittent left-handed weakness and numbness occurring over the weekend. Yesterday this became constant and also moved to her left leg. She is currently endorsing left hand and lower extremity numbness with some tingling in her left hand. She also feels weaker on the left side compared with the right. She denies vision changes. She denies changes in her voice or speech. She denies being in pain. She denies chest pain or shortness of breath. She denies nausea, vomiting or abdominal pain. She has no additional acute complaints or concerns. Past Medical History:  Diagnosis Date  . Diabetes mellitus   . DJD (degenerative joint disease)   . Hyperlipidemia   . Hypertension     Patient Active Problem List   Diagnosis Date Noted  . Diabetes mellitus due to underlying condition with stage 3 chronic kidney disease, with long-term current use of insulin ([place]) 08/15/2016  . Non compliance w medication regimen 06/18/2016  . Infected blister of toe of left foot 11/15/2015  . Leg swelling 02/19/2015  . S/P knee surgery 01/19/2014  . Septic arthritis of knee, left ([place]) 12/17/2013  . Left knee pain 12/03/2013  . Chronic foot ulcer ([place]) 11/08/2013  . CKD stage 3 due to type 2 diabetes mellitus ([place]) 03/06/2012  . Overweight(278.02) 02/21/2012  . Nail abnormalities 02/21/2012  . Pre-ulcerative calluses 02/21/2012  . HTN (hypertension) 02/20/2012  . DDD (degenerative disc disease) 02/20/2012  . Hyperlipidemia 02/20/2012    Past Surgical History:  Procedure  Laterality Date  .  rt toe debridement    . CATARACT EXTRACTION, BILATERAL     right eye-2007, left eye-2012  . EXAM UNDER ANESTHESIA WITH MANIPULATION OF KNEE Left 12/26/2013   Procedure: EXAM UNDER ANESTHESIA WITH MANIPULATION OF KNEE;  Surgeon: Carole Civil, MD;  Location: [facility];  Service: Orthopedics;  Laterality: Left;  . KNEE ARTHROSCOPY Left 12/26/2013   Procedure: ARTHROSCOPY KNEE;  Surgeon: Carole Civil, MD;  Location: [facility];  Service: Orthopedics;  Laterality: Left;  . right toe     Great toe  . SYNOVECTOMY Left 12/26/2013   Procedure: EXTENSIVE SYNOVECTOMY LEFT KNEE;  Surgeon: Carole Civil, MD;  Location: [facility];  Service: Orthopedics;  Laterality: Left;  . WOUND DEBRIDEMENT Left 12/26/2013   Procedure: LEFT GREAT TOE CALLOUS DEBRIDEMENT;  Surgeon: Carole Civil, MD;  Location: [facility];  Service: Orthopedics;  Laterality: Left;    OB History    Gravida Para Term Preterm AB Living   3         3   SAB TAB Ectopic Multiple Live Births                   Home Medications    Prior to Admission medications   Medication Sig Start Date End Date Taking? Authorizing Provider  ACCU-CHEK FASTCLIX LANCETS MISC 1 each 3 (three) times daily.  12/05/13   Historical Provider, MD  ACCU-CHEK SMARTVIEW test strip 1 each by Other route 3 (three) times daily.  12/05/13   Historical Provider, MD  aspirin  81 MG tablet Take 81 mg by mouth daily.    Historical Provider, MD  Blood Glucose Monitoring Suppl (ACCU-CHEK NANO SMARTVIEW) W/DEVICE KIT  12/05/13   Historical Provider, MD  cloNIDine (CATAPRES) 0.1 MG tablet Take 1 tablet (0.1 mg total) by mouth 2 (two) times daily. 06/16/16   Alycia Rossetti, MD  cyclobenzaprine (FLEXERIL) 5 MG tablet Take 1 tablet (5 mg total) by mouth 3 (three) times daily as needed for muscle spasms. 11/07/16   Carole Civil, MD  diclofenac (CATAFLAM) 50 MG tablet Take 1 tablet (50 mg total) by mouth 2 (two) times daily. 11/07/16   Carole Civil, MD  diclofenac sodium (VOLTAREN) 1 % GEL Apply dime size  three times a day as needed, to affected areas 10/23/14   Alycia Rossetti, MD  fenofibrate (TRICOR) 48 MG tablet Take 1 tablet (48 mg total) by mouth daily. 10/20/16   Alycia Rossetti, MD  fish oil-omega-3 fatty acids 1000 MG capsule Take 2 g by mouth daily.    Historical Provider, MD  insulin glargine (LANTUS) 100 UNIT/ML injection INJECT 18 UNITS SUBCUTANEOUSLY in am 11/15/16   Philemon Kingdom, MD  lisinopril-hydrochlorothiazide (PRINZIDE,ZESTORETIC) 20-12.5 MG tablet Take 2 tablets by mouth daily. 07/19/16   Alycia Rossetti, MD  metoprolol succinate (TOPROL-XL) 50 MG 24 hr tablet Take 1 tablet (50 mg total) by mouth daily. Take with or immediately following a meal. 07/19/16   Alycia Rossetti, MD  NOVOLIN R RELION 100 UNIT/ML injection INJECT(0.04-0.06 MLS) 4-6 UNITS SUBCUTANEOUSLY THREE TIMES DAILY BEFORE MEAL(S) 10/31/16   Philemon Kingdom, MD  trolamine salicylate (ASPERCREME) 10 % cream Apply 1 application topically as needed for muscle pain.    Historical Provider, MD    Family History Family History  Problem Relation Age of Onset  . Diabetes Mother   . Stroke Father   . Heart disease Father   . Diabetes Sister   . Diabetes Brother   . Diabetes Sister     Social History Social History  Substance Use Topics  . Smoking status: Never Smoker  . Smokeless tobacco: Never Used  . Alcohol use No     Allergies   Statins   Review of Systems Review of Systems   Physical Exam Updated Vital Signs BP 196/85 (BP Location: Left Arm)   Pulse 98   Temp 98 F (36.7 C) (Oral)   Resp 18   Ht '5\' 10"'  (1.778 m)   Wt 79.8 kg   SpO2 100%   BMI 25.25 kg/m   Physical Exam  Constitutional: She is oriented to person, place, and time. She appears well-developed and well-nourished.  HENT:  Head: Normocephalic and atraumatic.  Eyes: Pupils are equal, round, and reactive to light.  Cardiovascular:  Murmur  heard. Tachycardic on examination, grade 3/6 holosystolic murmur heard best at the right upper sternal border  Pulmonary/Chest: Effort normal and breath sounds normal. No respiratory distress. She has no wheezes.  Abdominal: Soft. Bowel sounds are normal. She exhibits no distension. There is no tenderness.  Neurological: She is alert and oriented to person, place, and time. No cranial nerve deficit.  No cranial nerve deficits appreciated on examination. Finger-nose-finger was scored 8 on the left compared with the right. No pronator drift. Subjective left sided sensory deficit compared with right in the hand. Strength 5 out of 5 in the upper and lower shortness bilaterally.     ED Treatments / Results  Labs (all labs ordered are listed, but only  abnormal results are displayed) Labs Reviewed  BASIC METABOLIC PANEL - Abnormal; Notable for the following:       Result Value   Sodium 134 (*)    Chloride 97 (*)    Glucose, Bld 260 (*)    BUN 24 (*)    Creatinine, Ser 1.52 (*)    GFR calc non Af Amer 34 (*)    GFR calc Af Amer 39 (*)    All other components within normal limits  CBC  URINALYSIS, ROUTINE W REFLEX MICROSCOPIC    EKG  EKG Interpretation  Date/Time:  Monday January 15 2017 10:42:50 EST Ventricular Rate:  105 PR Interval:    QRS Duration: 104 QT Interval:  338 QTC Calculation: 447 R Axis:   84 Text Interpretation:  Sinus tachycardia Consider left atrial enlargement Probable anteroseptal infarct, recent Abnormal T, consider ischemia, diffuse leads Similar to previous Confirmed by Reather Converse MD, JOSHUA [phone]) on 01/15/2017 10:45:36 AM       Radiology Mr Jodene Nam Head Wo Contrast  Result Date: 01/15/2017 CLINICAL DATA:  Left-sided weakness over the last 2 days. EXAM: MRI HEAD WITHOUT CONTRAST MRA HEAD WITHOUT CONTRAST TECHNIQUE: Multiplanar, multiecho pulse sequences of the brain and surrounding structures were obtained without intravenous contrast. Angiographic images of the  head were obtained using MRA technique without contrast. COMPARISON:  None. FINDINGS: MRI HEAD FINDINGS Brain: Diffusion imaging shows a 7 mm acute infarction in the right lateral thalamus. Punctate acute or subacute infarction in the deep white matter adjacent to the posterior body of the left lateral ventricle. No other acute finding. The brainstem and cerebellum are normal. There are old small vessel infarctions affecting the thalami, basal ganglia and hemispheric deep white matter. Old right frontal cortical/ subcortical infarction. Alternatively, this could be gliosis related to a developmental venous anomaly. No sign of mass lesion, acute hemorrhage, hydrocephalus or extra-axial collection. Some hemosiderin deposition associated with left hemisphere radiating white matter old infarction. Vascular: Major vessels at the base of the brain show flow. Skull and upper cervical spine: Negative Sinuses/Orbits: Clear/normal Other: None significant MRA HEAD FINDINGS Both internal carotid arteries are widely patent into the brain. No siphon stenosis. The anterior and middle cerebral vessels are patent without proximal stenosis, aneurysm or vascular malformation. Distal vessels may show mild atherosclerotic irregularity. Both vertebral arteries are widely patent to the basilar. No basilar stenosis. Posterior circulation branch vessels appear normal. IMPRESSION: Acute 7 mm infarction right lateral thalamus. Possible acute 2 mm infarction in the deep white matter adjacent to the posterior body of the left lateral ventricle. Extensive chronic small vessel ischemic changes elsewhere throughout the brain including old the laminae ankle occur infarctions in an old hemorrhagic infarction of the radiating white matter tracts on the left. Gliosis in the right frontal subcortical white matter that could be an old infarction or gliosis associated with a developmental venous anomaly. Electronically Signed   By: Nelson Chimes M.D.    On: 01/15/2017 12:54   Mr Brain Wo Contrast  Result Date: 01/15/2017 CLINICAL DATA:  Left-sided weakness over the last 2 days. EXAM: MRI HEAD WITHOUT CONTRAST MRA HEAD WITHOUT CONTRAST TECHNIQUE: Multiplanar, multiecho pulse sequences of the brain and surrounding structures were obtained without intravenous contrast. Angiographic images of the head were obtained using MRA technique without contrast. COMPARISON:  None. FINDINGS: MRI HEAD FINDINGS Brain: Diffusion imaging shows a 7 mm acute infarction in the right lateral thalamus. Punctate acute or subacute infarction in the deep white matter adjacent to the posterior  body of the left lateral ventricle. No other acute finding. The brainstem and cerebellum are normal. There are old small vessel infarctions affecting the thalami, basal ganglia and hemispheric deep white matter. Old right frontal cortical/ subcortical infarction. Alternatively, this could be gliosis related to a developmental venous anomaly. No sign of mass lesion, acute hemorrhage, hydrocephalus or extra-axial collection. Some hemosiderin deposition associated with left hemisphere radiating white matter old infarction. Vascular: Major vessels at the base of the brain show flow. Skull and upper cervical spine: Negative Sinuses/Orbits: Clear/normal Other: None significant MRA HEAD FINDINGS Both internal carotid arteries are widely patent into the brain. No siphon stenosis. The anterior and middle cerebral vessels are patent without proximal stenosis, aneurysm or vascular malformation. Distal vessels may show mild atherosclerotic irregularity. Both vertebral arteries are widely patent to the basilar. No basilar stenosis. Posterior circulation branch vessels appear normal. IMPRESSION: Acute 7 mm infarction right lateral thalamus. Possible acute 2 mm infarction in the deep white matter adjacent to the posterior body of the left lateral ventricle. Extensive chronic small vessel ischemic changes elsewhere  throughout the brain including old the laminae ankle occur infarctions in an old hemorrhagic infarction of the radiating white matter tracts on the left. Gliosis in the right frontal subcortical white matter that could be an old infarction or gliosis associated with a developmental venous anomaly. Electronically Signed   By: Nelson Chimes M.D.   On: 01/15/2017 12:54    Procedures Procedures (including critical care time)  Medications Ordered in ED Medications - No data to display   Initial Impression / Assessment and Plan / ED Course  I have reviewed the triage vital signs and the nursing notes.  Pertinent labs & imaging results that were available during my care of the patient were reviewed by me and considered in my medical decision making (see chart for details).   Patient is a 71 year old female with multiple stroke risk factors including hypertension, hyperlipidemia and diabetes who presents with left handed numbness and weakness that was intermittent over the weekend but is now constant. Neurological examination shows no cranial nerve deficit. Pronator drift negative. Finger nose finger less coordinated on the left compared with the right. Subjective sensory deficit in the left hand compared with the right. Given the patient's multiple stroke risk factors and new onset of symptoms I am concerned that the patient may have had a stroke. Given that she is out of time for intervention including TPA or other interventional therapy we'll proceed with brain MRI without contrast. Brain MRI shows new right-sided infarct. Acute 7 mm infarction in the right lateral thalamus and a possible acute 2 millimeters infarction in the deep white matter adjacent to the posterior body of the left lateral ventricle. She will need admission to the medicine service for stroke workup. Spoke with admitting physician and the patient will be admitted at this time.  Final Clinical Impressions(s) / ED Diagnoses   Final  diagnoses:  Left-sided weakness    New Prescriptions New Prescriptions   No medications on file     Ophelia Shoulder, MD 01/15/17 [place], MD 01/16/17 [phone]

## 2017-01-16 ENCOUNTER — Inpatient Hospital Stay (HOSPITAL_COMMUNITY): Payer: Medicare PPO

## 2017-01-16 DIAGNOSIS — I6789 Other cerebrovascular disease: Secondary | ICD-10-CM

## 2017-01-16 DIAGNOSIS — N179 Acute kidney failure, unspecified: Secondary | ICD-10-CM | POA: Insufficient documentation

## 2017-01-16 LAB — BASIC METABOLIC PANEL
Anion gap: 7 (ref 5–15)
BUN: 31 mg/dL — AB (ref 6–20)
CHLORIDE: 98 mmol/L — AB (ref 101–111)
CO2: 28 mmol/L (ref 22–32)
CREATININE: 2.03 mg/dL — AB (ref 0.44–1.00)
Calcium: 8.8 mg/dL — ABNORMAL LOW (ref 8.9–10.3)
GFR calc non Af Amer: 24 mL/min — ABNORMAL LOW (ref >60)
GFR, EST AFRICAN AMERICAN: 28 mL/min — AB (ref >60)
GLUCOSE: 204 mg/dL — AB (ref 65–99)
Potassium: 4.2 mmol/L (ref 3.5–5.1)
Sodium: 133 mmol/L — ABNORMAL LOW (ref 135–145)

## 2017-01-16 LAB — GLUCOSE, CAPILLARY
GLUCOSE-CAPILLARY: 176 mg/dL — AB (ref 65–99)
Glucose-Capillary: 169 mg/dL — ABNORMAL HIGH (ref 65–99)
Glucose-Capillary: 116 mg/dL — ABNORMAL HIGH (ref 65–99)
Glucose-Capillary: 204 mg/dL — ABNORMAL HIGH (ref 65–99)

## 2017-01-16 LAB — LIPID PANEL
CHOL/HDL RATIO: 3.1 ratio
Cholesterol: 180 mg/dL (ref 0–200)
HDL: 59 mg/dL (ref >40)
LDL CALC: 102 mg/dL — AB (ref 0–99)
Triglycerides: 95 mg/dL (ref <150)
VLDL: 19 mg/dL (ref 0–40)

## 2017-01-16 LAB — ECHOCARDIOGRAM COMPLETE
HEIGHTINCHES: 70 in
WEIGHTICAEL: 2724.8 [oz_av]

## 2017-01-16 MED ORDER — ENOXAPARIN SODIUM 30 MG/0.3ML ~~LOC~~ SOLN
30.0000 mg | SUBCUTANEOUS | Status: DC
  Administered 2017-01-16 – 2017-01-17 (×2): 30 mg via SUBCUTANEOUS
  Filled 2017-01-16 (×2): qty 0.3

## 2017-01-16 MED ORDER — SODIUM CHLORIDE 0.9 % IV SOLN
INTRAVENOUS | Status: DC
  Administered 2017-01-16 – 2017-01-18 (×4): via INTRAVENOUS

## 2017-01-16 MED ORDER — PHENOL 1.4 % MT LIQD
1.0000 | OROMUCOSAL | Status: DC | PRN
  Filled 2017-01-16: qty 177

## 2017-01-16 NOTE — Evaluation (Signed)
Speech Language Pathology Evaluation Patient Details Name: Amanda Krueger MRN: [id number] DOB: 01-14-1946 Today's Date: 01/16/2017 Time: 1845-1910 SLP Time Calculation (min) (ACUTE ONLY): 25 min  Problem List:  Patient Active Problem List   Diagnosis Date Noted  . AKI (acute kidney injury) ([place]) 01/16/2017  . Stroke ([place]) 01/15/2017  . Left arm numbness 01/15/2017  . Stroke (cerebrum) ([place]) 01/15/2017  . Diabetes mellitus due to underlying condition with stage 3 chronic kidney disease, with long-term current use of insulin ([place]) 08/15/2016  . Non compliance w medication regimen 06/18/2016  . Infected blister of toe of left foot 11/15/2015  . Leg swelling 02/19/2015  . S/P knee surgery 01/19/2014  . Septic arthritis of knee, left ([place]) 12/17/2013  . Left knee pain 12/03/2013  . Chronic foot ulcer ([place]) 11/08/2013  . CKD stage 3 due to type 2 diabetes mellitus ([place]) 03/06/2012  . Overweight(278.02) 02/21/2012  . Nail abnormalities 02/21/2012  . Pre-ulcerative calluses 02/21/2012  . HTN (hypertension) 02/20/2012  . DDD (degenerative disc disease) 02/20/2012  . Hyperlipidemia 02/20/2012   Past Medical History:  Past Medical History:  Diagnosis Date  . Diabetes mellitus   . DJD (degenerative joint disease)   . Hyperlipidemia   . Hypertension    Past Surgical History:  Past Surgical History:  Procedure Laterality Date  .  rt toe debridement    . CATARACT EXTRACTION, BILATERAL     right eye-2007, left eye-2012  . EXAM UNDER ANESTHESIA WITH MANIPULATION OF KNEE Left 12/26/2013   Procedure: EXAM UNDER ANESTHESIA WITH MANIPULATION OF KNEE;  Surgeon: Carole Civil, MD;  Location: [facility];  Service: Orthopedics;  Laterality: Left;  . KNEE ARTHROSCOPY Left 12/26/2013   Procedure: ARTHROSCOPY KNEE;  Surgeon: Carole Civil, MD;  Location: [facility];  Service: Orthopedics;  Laterality: Left;  . right toe     Great toe  . SYNOVECTOMY Left 12/26/2013   Procedure: EXTENSIVE  SYNOVECTOMY LEFT KNEE;  Surgeon: Carole Civil, MD;  Location: [facility];  Service: Orthopedics;  Laterality: Left;  . WOUND DEBRIDEMENT Left 12/26/2013   Procedure: LEFT GREAT TOE CALLOUS DEBRIDEMENT;  Surgeon: Carole Civil, MD;  Location: [facility];  Service: Orthopedics;  Laterality: Left;   HPI:  Amanda Krueger is a 71 y.o. female with medical history significant of hypertension, diabetes, presents to the hospital with complaints of right arm numbness. Patient reports that approximately 3 days ago she was lifting heavy wood out of her truck. The following day, she noticed numbness in her left hand and arm numbness. She felt this may be related to lifting wood the prior day. This progressed throughout the day where she felt numb in her entire body. She did not seek medical care at that time. The following day, overall body numbness had improved, but remained in her left upper extremity. She felt that her left arm was also somewhat weak. She has chronic pain in her left knee, but was having increasing difficulty walking, more so than normal. She denies any changes in vision, changes in speech, difficulty swallowing.  MRI positive for acute infarct   Assessment / Plan / Recommendation Clinical Impression  Pt seen at bedside for cognitive linguisitic evaluation due to CVA. Pt denies changes in swallowing, speech, language, or cognition. Pt is alert, oriented, following complex commands, speech is fluent without dysarthria, apraxia, or paraphasic errors. Pt's grandson lives in her home, but she is independent. She uses a cane for ambulation due to previous left knee surgery/pain.  Pt's only current complaint is that she has not had a bowel movement since admission (yesterday). Cognitive linguistic skills appear WFL across domains. No further SLP services indicated at this time. Pt in agreement.    SLP Assessment  Patient does not need any further Speech Lanaguage Pathology Services    Follow Up  Recommendations  None    Frequency and Duration           SLP Evaluation Cognition  Overall Cognitive Status: Within Functional Limits for tasks assessed Arousal/Alertness: Awake/alert Orientation Level: Oriented X4 Memory: Appears intact Awareness: Appears intact Problem Solving: Appears intact Safety/Judgment: Appears intact       Comprehension  Auditory Comprehension Overall Auditory Comprehension: Appears within functional limits for tasks assessed Yes/No Questions: Within Functional Limits Commands: Within Functional Limits Conversation: Complex Visual Recognition/Discrimination Discrimination: Within Function Limits Reading Comprehension Reading Status: Not tested    Expression Expression Primary Mode of Expression: Verbal Verbal Expression Overall Verbal Expression: Appears within functional limits for tasks assessed Initiation: No impairment Level of Generative/Spontaneous Verbalization: Conversation Repetition: No impairment Naming: No impairment Pragmatics: No impairment (talkative) Non-Verbal Means of Communication: Not applicable Written Expression Dominant Hand: Right Written Expression: Not tested   Oral / Motor  Oral Motor/Sensory Function Overall Oral Motor/Sensory Function: Within functional limits Motor Speech Overall Motor Speech: Appears within functional limits for tasks assessed Respiration: Within functional limits Phonation: Normal (Pt reports mild hoarseness from recent cold) Resonance: Within functional limits Articulation: Within functional limitis Intelligibility: Intelligible Motor Planning: Witnin functional limits Motor Speech Errors: Not applicable   Thank you,  Genene Churn, [place] 01/16/2017, 7:27 PM

## 2017-01-16 NOTE — Progress Notes (Signed)
PROGRESS NOTE    Amanda Krueger  TIR:[id number] DOB: 1946/03/09 DOA: 01/15/2017 PCP: Vic Blackbird, MD    Brief Narrative:  71 year old female with history of hypertension, diabetes mellitus who is noncompliant with her medications, presents with left hand and arm numbness. She was seen in the emergency room and found to have acute stroke. She was admitted for further treatment. She's also developed worsening creatinine and has been started on IV fluids. If her renal function has improved by tomorrow, she could potentially discharge home.   Assessment & Plan:   Active Problems:   HTN (hypertension)   CKD stage 3 due to type 2 diabetes mellitus (HCC)   Diabetes mellitus due to underlying condition with stage 3 chronic kidney disease, with long-term current use of insulin (HCC)   Stroke (HCC)   Left arm numbness   Stroke (cerebrum) (HCC)   AKI (acute kidney injury) ([place])   1. Acute stroke. MRI confirms acute 82mm infarction in right lateral thalamus. Seen by neurology who recommends full dose aspirin. Carotid Dopplers are unremarkable for significant stenosis. Echocardiogram also unremarkable. She's been seen by physical therapy. LDL is elevated at 102. She has not tolerated statins in the past. She wishes to discuss this further with her primary care physician. A1c in process.  2. AKI on CKD stage III. Possibly related to elevated blood pressure on admission. Hold ACE inhibitor for now. Start on IV fluids and recheck in a.m .  3. Diabetes. Blood sugars have been stable. Continue Lantus and sliding scale insulin.  4. Hypertension. Patient has been noncompliant with her medications. She presented with severe hypertension with a systolic blood pressure greater than 200. We started on clonidine and metoprolol. Lisinopril currently on hold due to renal failure.   DVT prophylaxis: lovenox Code Status: full Family Communication: no family present Disposition Plan: discharge home once  renal function improving   Consultants:   Neurology  Procedures:  Echo: Left ventricle: The cavity size was normal. Wall thickness was   increased in a pattern of severe LVH. Systolic function was   normal. The estimated ejection fraction was in the range of 50%   to 55%. Wall motion was normal; there were no regional wall   motion abnormalities. Doppler parameters are consistent with   abnormal left ventricular relaxation (grade 1 diastolic   dysfunction). Doppler parameters are consistent with high   ventricular filling pressure.  - Atrial septum: No defect or patent foramen ovale was identified  Antimicrobials:      Subjective: Feeling better. Still has some numbness in right left hand  Objective: Vitals:   01/16/17 0630 01/16/17 0830 01/16/17 1230 01/16/17 1630  BP: 124/68 (!) 147/71 (!) 127/58 128/64  Pulse: 65 73 60 (!) 55  Resp: 20 20 18 16   Temp: 98.3 F (36.8 C) 98.4 F (36.9 C) 97.9 F (36.6 C) 97.9 F (36.6 C)  TempSrc: Oral Oral Oral Oral  SpO2: 97% 100% 100% 100%  Weight:      Height:        Intake/Output Summary (Last 24 hours) at 01/16/17 1911 Last data filed at 01/16/17 1200  Gross per 24 hour  Intake              360 ml  Output                1 ml  Net              359 ml   [employer]  01/15/17 1043 01/15/17 1847  Weight: 79.8 kg (176 lb) 77.2 kg (170 lb 4.8 oz)    Examination:  General exam: Appears calm and comfortable  Respiratory system: Clear to auscultation. Respiratory effort normal. Cardiovascular system: S1 & S2 heard, RRR. No JVD, murmurs, rubs, gallops or clicks. No pedal edema. Gastrointestinal system: Abdomen is nondistended, soft and nontender. No organomegaly or masses felt. Normal bowel sounds heard. Central nervous system: Alert and oriented. No focal neurological deficits. Extremities: Symmetric 5 x 5 power. Skin: No rashes, lesions or ulcers Psychiatry: Judgement and insight appear normal. Mood & affect  appropriate.     Data Reviewed: I have personally reviewed following labs and imaging studies  CBC:  Recent Labs Lab 01/15/17 1045  WBC 6.4  HGB 13.6  HCT 40.2  MCV 86.1  PLT 951   Basic Metabolic Panel:  Recent Labs Lab 01/15/17 1045 01/16/17 0607  NA 134* 133*  K 4.0 4.2  CL 97* 98*  CO2 30 28  GLUCOSE 260* 204*  BUN 24* 31*  CREATININE 1.52* 2.03*  CALCIUM 9.2 8.8*   GFR: Estimated Creatinine Clearance: 28.3 mL/min (by C-G formula based on SCr of 2.03 mg/dL (H)). Liver Function Tests: No results for input(s): AST, ALT, ALKPHOS, BILITOT, PROT, ALBUMIN in the last 168 hours. No results for input(s): LIPASE, AMYLASE in the last 168 hours. No results for input(s): AMMONIA in the last 168 hours. Coagulation Profile: No results for input(s): INR, PROTIME in the last 168 hours. Cardiac Enzymes: No results for input(s): CKTOTAL, CKMB, CKMBINDEX, TROPONINI in the last 168 hours. BNP (last 3 results) No results for input(s): PROBNP in the last 8760 hours. HbA1C: No results for input(s): HGBA1C in the last 72 hours. CBG:  Recent Labs Lab 01/15/17 2251 01/16/17 0758 01/16/17 1128 01/16/17 1625  GLUCAP 200* 169* 176* 116*   Lipid Profile:  Recent Labs  01/16/17 0605  CHOL 180  HDL 59  LDLCALC 102*  TRIG 95  CHOLHDL 3.1   Thyroid Function Tests: No results for input(s): TSH, T4TOTAL, FREET4, T3FREE, THYROIDAB in the last 72 hours. Anemia Panel: No results for input(s): VITAMINB12, FOLATE, FERRITIN, TIBC, IRON, RETICCTPCT in the last 72 hours. Sepsis Labs: No results for input(s): PROCALCITON, LATICACIDVEN in the last 168 hours.  No results found for this or any previous visit (from the past 240 hour(s)).       Radiology Studies: Mr Virgel Paling OA Contrast  Result Date: 01/15/2017 CLINICAL DATA:  Left-sided weakness over the last 2 days. EXAM: MRI HEAD WITHOUT CONTRAST MRA HEAD WITHOUT CONTRAST TECHNIQUE: Multiplanar, multiecho pulse sequences of  the brain and surrounding structures were obtained without intravenous contrast. Angiographic images of the head were obtained using MRA technique without contrast. COMPARISON:  None. FINDINGS: MRI HEAD FINDINGS Brain: Diffusion imaging shows a 7 mm acute infarction in the right lateral thalamus. Punctate acute or subacute infarction in the deep white matter adjacent to the posterior body of the left lateral ventricle. No other acute finding. The brainstem and cerebellum are normal. There are old small vessel infarctions affecting the thalami, basal ganglia and hemispheric deep white matter. Old right frontal cortical/ subcortical infarction. Alternatively, this could be gliosis related to a developmental venous anomaly. No sign of mass lesion, acute hemorrhage, hydrocephalus or extra-axial collection. Some hemosiderin deposition associated with left hemisphere radiating white matter old infarction. Vascular: Major vessels at the base of the brain show flow. Skull and upper cervical spine: Negative Sinuses/Orbits: Clear/normal Other: None significant MRA HEAD  FINDINGS Both internal carotid arteries are widely patent into the brain. No siphon stenosis. The anterior and middle cerebral vessels are patent without proximal stenosis, aneurysm or vascular malformation. Distal vessels may show mild atherosclerotic irregularity. Both vertebral arteries are widely patent to the basilar. No basilar stenosis. Posterior circulation branch vessels appear normal. IMPRESSION: Acute 7 mm infarction right lateral thalamus. Possible acute 2 mm infarction in the deep white matter adjacent to the posterior body of the left lateral ventricle. Extensive chronic small vessel ischemic changes elsewhere throughout the brain including old the laminae ankle occur infarctions in an old hemorrhagic infarction of the radiating white matter tracts on the left. Gliosis in the right frontal subcortical white matter that could be an old infarction  or gliosis associated with a developmental venous anomaly. Electronically Signed   By: Nelson Chimes M.D.   On: 01/15/2017 12:54   Mr Brain Wo Contrast  Result Date: 01/15/2017 CLINICAL DATA:  Left-sided weakness over the last 2 days. EXAM: MRI HEAD WITHOUT CONTRAST MRA HEAD WITHOUT CONTRAST TECHNIQUE: Multiplanar, multiecho pulse sequences of the brain and surrounding structures were obtained without intravenous contrast. Angiographic images of the head were obtained using MRA technique without contrast. COMPARISON:  None. FINDINGS: MRI HEAD FINDINGS Brain: Diffusion imaging shows a 7 mm acute infarction in the right lateral thalamus. Punctate acute or subacute infarction in the deep white matter adjacent to the posterior body of the left lateral ventricle. No other acute finding. The brainstem and cerebellum are normal. There are old small vessel infarctions affecting the thalami, basal ganglia and hemispheric deep white matter. Old right frontal cortical/ subcortical infarction. Alternatively, this could be gliosis related to a developmental venous anomaly. No sign of mass lesion, acute hemorrhage, hydrocephalus or extra-axial collection. Some hemosiderin deposition associated with left hemisphere radiating white matter old infarction. Vascular: Major vessels at the base of the brain show flow. Skull and upper cervical spine: Negative Sinuses/Orbits: Clear/normal Other: None significant MRA HEAD FINDINGS Both internal carotid arteries are widely patent into the brain. No siphon stenosis. The anterior and middle cerebral vessels are patent without proximal stenosis, aneurysm or vascular malformation. Distal vessels may show mild atherosclerotic irregularity. Both vertebral arteries are widely patent to the basilar. No basilar stenosis. Posterior circulation branch vessels appear normal. IMPRESSION: Acute 7 mm infarction right lateral thalamus. Possible acute 2 mm infarction in the deep white matter adjacent to  the posterior body of the left lateral ventricle. Extensive chronic small vessel ischemic changes elsewhere throughout the brain including old the laminae ankle occur infarctions in an old hemorrhagic infarction of the radiating white matter tracts on the left. Gliosis in the right frontal subcortical white matter that could be an old infarction or gliosis associated with a developmental venous anomaly. Electronically Signed   By: Nelson Chimes M.D.   On: 01/15/2017 12:54   US Carotid Bilateral (at [facility] Only)  Result Date: 01/16/2017 CLINICAL DATA:  CVA. EXAM: BILATERAL CAROTID DUPLEX ULTRASOUND TECHNIQUE: Pearline Cables scale imaging, color Doppler and duplex ultrasound were performed of bilateral carotid and vertebral arteries in the neck. COMPARISON:  MRI 01/15/2017. FINDINGS: Criteria: Quantification of carotid stenosis is based on velocity parameters that correlate the residual internal carotid diameter with NASCET-based stenosis levels, using the diameter of the distal internal carotid lumen as the denominator for stenosis measurement. The following velocity measurements were obtained: RIGHT ICA:  67/20 cm/sec CCA:  47/8 cm/sec SYSTOLIC ICA/CCA RATIO:  0.8 DIASTOLIC ICA/CCA RATIO:  2.2 ECA:  67  cm/sec LEFT ICA:  68/20 cm/sec CCA:  56/86 cm/sec SYSTOLIC ICA/CCA RATIO:  1.0 DIASTOLIC ICA/CCA RATIO:  2.0 ECA:  81 cm/sec RIGHT CAROTID ARTERY: No significant carotid atherosclerotic vascular disease. RIGHT VERTEBRAL ARTERY:  Patent with antegrade flow. LEFT CAROTID ARTERY: No significant carotid atherosclerotic vascular disease. LEFT VERTEBRAL ARTERY:  Patent with antegrade flow. IMPRESSION: 1. No significant carotid atherosclerotic vascular disease noted. Carotid arteries are widely patent bilaterally. 2. Vertebral arteries are patent with antegrade flow. Electronically Signed   By: [place]ell   On: 01/16/2017 12:01        Scheduled Meds: . aspirin  300 mg Rectal Daily   Or  . aspirin  325 mg Oral  Daily  . cloNIDine  0.1 mg Oral BID  . enoxaparin (LOVENOX) injection  30 mg Subcutaneous Q24H  . insulin aspart  0-15 Units Subcutaneous TID WC  . insulin aspart  0-5 Units Subcutaneous QHS  . insulin glargine  18 Units Subcutaneous Daily  . metoprolol succinate  50 mg Oral Daily   Continuous Infusions: . sodium chloride       LOS: 1 day    Time spent: 35mins    Joanne Brander, MD [facility] Pager [phone]  If 7PM-7AM, please contact night-coverage [email or web address] Password [email or web address] 01/16/2017, 7:11 PM

## 2017-01-16 NOTE — Progress Notes (Signed)
*  PRELIMINARY RESULTS* Echocardiogram 2D Echocardiogram has been performed.  [name] 01/16/2017, 2:43 PM

## 2017-01-16 NOTE — Evaluation (Signed)
Occupational Therapy Evaluation Patient Details Name: Amanda Krueger MRN: [id number] DOB: June 13, 1946 Today's Date: 01/16/2017    History of Present Illness Amanda Krueger is a 71 y.o. female with medical history significant of hypertension, diabetes, presents to the hospital with complaints of right arm numbness. Patient reports that approximately 3 days ago she was lifting heavy wood out of her truck. The following day, she noticed numbness in her left hand and arm numbness. She felt this may be related to lifting wood the prior day. This progressed throughout the day where she felt numb in her entire body. She did not seek medical care at that time. The following day, overall body numbness had improved, but remained in her left upper extremity. She felt that her left arm was also somewhat weak. She has chronic pain in her left knee, but was having increasing difficulty walking, more so than normal. She denies any changes in vision, changes in speech, difficulty swallowing.  MRI positive for acute infarct   Clinical Impression   Pt awake, alert, oriented x4 this am, agreeable to OT evaluation. PTA pt independent with all B/IADL tasks. Pt requires min guard during standing ADL tasks and functional mobility. Pt reports she loses her balance often due to chronic left knee and back pain. Pt attempted tasks with SPC and rolling walker, safety with task completion improved significantly with RW. Pt is at baseline with ADL completion, BUE strength is WFL, sensation and coordination are intact. No further OT services required at this time.     Follow Up Recommendations  No OT follow up;Supervision - Intermittent    Equipment Recommendations  Other (comment) (RW)       Precautions / Restrictions Precautions Precautions: Fall Restrictions Weight Bearing Restrictions: No      Mobility Bed Mobility Overal bed mobility: Modified Independent                Transfers Overall transfer  level: Needs assistance Equipment used: Straight cane;Rolling walker (2 wheeled) Transfers: Sit to/from Stand Sit to Stand: Min guard;From elevated surface         General transfer comment: Pt required increased time and min guard due to 2 LOB during sit to stand. Improved with use of RW versus cane         ADL Overall ADL's : Needs assistance/impaired     Grooming: Wash/dry hands;Supervision/safety;Standing               Lower Body Dressing: Modified independent;Sitting/lateral leans   Toilet Transfer: Supervision/safety;Regular Toilet;Grab bars   Toileting- Clothing Manipulation and Hygiene: Modified independent;Sit to/from stand       Functional mobility during ADLs: Min guard;Rolling walker       Vision Vision Assessment?: No apparent visual deficits          Pertinent Vitals/Pain Pain Assessment: 0-10 Pain Score: 5  Pain Location: left knee Pain Descriptors / Indicators: Aching;Constant Pain Intervention(s): Limited activity within patient's tolerance;Monitored during session;Repositioned;Patient requesting pain meds-RN notified     Hand Dominance Right   Extremity/Trunk Assessment Upper Extremity Assessment Upper Extremity Assessment: Overall WFL for tasks assessed   Lower Extremity Assessment Lower Extremity Assessment: Defer to PT evaluation       Communication Communication Communication: No difficulties   Cognition Arousal/Alertness: Awake/alert Behavior During Therapy: WFL for tasks assessed/performed Overall Cognitive Status: Within Functional Limits for tasks assessed  Home Living Family/patient expects to be discharged to:: Private residence Living Arrangements: Other relatives (grandson) Available Help at Discharge: Family;Available PRN/intermittently Type of Home: House Home Access: Stairs to enter [employer] of Steps: 3 Entrance Stairs-Rails: None Home Layout: Two  level;Able to live on main level with bedroom/bathroom (14 stairs to second level, rarely uses) Alternate Level Stairs-Number of Steps: 14   Bathroom Shower/Tub: Teacher, early years/pre: Standard     Home Equipment: Cane - single point;Shower seat          Prior Functioning/Environment Level of Independence: Independent with assistive device(s)        Comments: Pt recently began using SPC during ambulation due to chronic knee and back pain        OT Problem List: Decreased safety awareness    End of Session Equipment Utilized During Treatment: Gait belt;Rolling walker [facility])  Activity Tolerance: Patient tolerated treatment well Patient left: in bed;with call bell/phone within reach;with nursing/sitter in room;with bed alarm set   Time: 9574-7340 OT Time Calculation (min): 32 min Charges:  OT General Charges $OT Visit: 1 Procedure OT Evaluation $OT Eval Low Complexity: 1 Procedure Guadelupe Sabin, OTR/L  [phone] 01/16/2017, 9:46 AM

## 2017-01-16 NOTE — Consult Note (Signed)
[place] A. Merlene Laughter, MD     [email or web address]          Amanda Krueger is an 71 y.o. female.   ASSESSMENT/PLAN: 1. Acute right thalamic lacunar infarct with risk factors being age, hypertension and diabetes. The patient will be placed on full aspirin 325. She should continue with other risk factor modifications including blood pressure and diabetes control. The patient can follow-up in the office with her primary care provider.  The patient is a 71 year old black female who developed the acute onset Saturday and of numbness and tingling involving the her entire body. She did not seek medical attention. Both the right side and left side were involved. It appears that the right-sided symptoms gradually resolved but she was left with persistent symptoms of numbness and tingling involving the left upper and left lower extremity. She waited for over 30 hours until Monday before she sought medical attention. She was told to come to emergency room after discussion with her primary care provider. She does not report having any other focal or strokelike symptoms in the past although she has been told by primary care provider that imaging showed that she may have had an old stroke in the past. She was told this many years ago. The patient does not report having other symptoms such as dysarthria, dysphagia, dizziness or headaches. She denies shortness of breath, dyspnea, chest pain, GI or GU symptoms. The review of systems otherwise negative. She tells me that she was supposed be taking aspirin 81 mg but frankly was not compliant with this.  GENERAL: This a very pleasant female who appears somewhat younger than the stated age.  HEENT: Supple. Atraumatic normocephalic.   ABDOMEN: soft  EXTREMITIES: No edema   BACK: Normal.  SKIN: Normal by inspection.    MENTAL STATUS: Alert and oriented including being oriented to her age and month. Speech, language and cognition are generally  intact. Judgment and insight normal.   CRANIAL NERVES: Pupils are equal, round and reactive to light and accommodation; extra ocular movements are full, there is no significant nystagmus; visual fields are full; upper and lower facial muscles are normal in strength and symmetric, there is no flattening of the nasolabial folds; tongue is midline; uvula is midline; shoulder elevation is normal. No visual double simultaneous extinction.  MOTOR: Normal tone, bulk and strength; no pronator drift.  COORDINATION: Left finger to nose is normal, right finger to nose is normal, No rest tremor; no intention tremor; no postural tremor; no bradykinesia.  REFLEXES: Deep tendon reflexes are symmetrical and normal. Babinski reflexes are flexor bilaterally.   SENSATION: There is reduced sensation to temperature and light touch involving the left upper extremity and the left leg. The left leg is more pronounced. No clear neglect observed.  NIH stroke scale 2.     Blood pressure (!) 122/57, pulse 63, temperature 98.2 F (36.8 C), temperature source Oral, resp. rate 19, height _0  (1.778 m), weight 170 lb 4.8 oz (77.2 kg), SpO2 97 %.  Past Medical History:  Diagnosis Date  . Diabetes mellitus   . DJD (degenerative joint disease)   . Hyperlipidemia   . Hypertension     Past Surgical History:  Procedure Laterality Date  .  rt toe debridement    . CATARACT EXTRACTION, BILATERAL     right eye-2007, left eye-2012  . EXAM UNDER ANESTHESIA WITH MANIPULATION OF KNEE Left 12/26/2013   Procedure: EXAM UNDER ANESTHESIA WITH MANIPULATION OF KNEE;  Surgeon: Dorothyann Peng  Vela Prose, MD;  Location: [facility];  Service: Orthopedics;  Laterality: Left;  . KNEE ARTHROSCOPY Left 12/26/2013   Procedure: ARTHROSCOPY KNEE;  Surgeon: Carole Civil, MD;  Location: [facility];  Service: Orthopedics;  Laterality: Left;  . right toe     Great toe  . SYNOVECTOMY Left 12/26/2013   Procedure: EXTENSIVE SYNOVECTOMY LEFT KNEE;  Surgeon:  Carole Civil, MD;  Location: [facility];  Service: Orthopedics;  Laterality: Left;  . WOUND DEBRIDEMENT Left 12/26/2013   Procedure: LEFT GREAT TOE CALLOUS DEBRIDEMENT;  Surgeon: Carole Civil, MD;  Location: [facility];  Service: Orthopedics;  Laterality: Left;    Family History  Problem Relation Age of Onset  . Diabetes Mother   . Stroke Father   . Heart disease Father   . Diabetes Sister   . Diabetes Brother   . Diabetes Sister     Social History:  reports that she has never smoked. She has never used smokeless tobacco. She reports that she does not drink alcohol or use drugs.  Allergies:  Allergies  Allergen Reactions  . Statins Other (See Comments)    Joint pain    Medications: Prior to Admission medications   Medication Sig Start Date End Date Taking? Authorizing Provider  aspirin EC 81 MG tablet Take 81 mg by mouth daily.   Yes Historical Provider, MD  fish oil-omega-3 fatty acids 1000 MG capsule Take 1 g by mouth daily.    Yes Historical Provider, MD  insulin glargine (LANTUS) 100 UNIT/ML injection INJECT 18 UNITS SUBCUTANEOUSLY in am 11/15/16  Yes Philemon Kingdom, MD  lisinopril-hydrochlorothiazide (PRINZIDE,ZESTORETIC) 20-12.5 MG tablet Take 2 tablets by mouth daily. 07/19/16  Yes Alycia Rossetti, MD  metoprolol succinate (TOPROL-XL) 50 MG 24 hr tablet Take 1 tablet (50 mg total) by mouth daily. Take with or immediately following a meal. 07/19/16  Yes Alycia Rossetti, MD  NOVOLIN R RELION 100 UNIT/ML injection INJECT(0.04-0.06 MLS) 4-6 UNITS SUBCUTANEOUSLY THREE TIMES DAILY BEFORE MEAL(S) 10/31/16  Yes Philemon Kingdom, MD  ACCU-CHEK FASTCLIX LANCETS MISC 1 each 3 (three) times daily.  12/05/13   Historical Provider, MD  ACCU-CHEK SMARTVIEW test strip 1 each by Other route 3 (three) times daily.  12/05/13   Historical Provider, MD  Blood Glucose Monitoring Suppl (ACCU-CHEK NANO SMARTVIEW) W/DEVICE KIT  12/05/13   Historical Provider, MD  cloNIDine (CATAPRES) 0.1 MG  tablet Take 1 tablet (0.1 mg total) by mouth 2 (two) times daily. Patient not taking: Reported on 01/15/2017 06/16/16   Alycia Rossetti, MD  cyclobenzaprine (FLEXERIL) 5 MG tablet Take 1 tablet (5 mg total) by mouth 3 (three) times daily as needed for muscle spasms. Patient not taking: Reported on 01/15/2017 11/07/16   Carole Civil, MD  diclofenac (CATAFLAM) 50 MG tablet Take 1 tablet (50 mg total) by mouth 2 (two) times daily. Patient not taking: Reported on 01/15/2017 11/07/16   Carole Civil, MD  diclofenac sodium (VOLTAREN) 1 % GEL Apply dime size  three times a day as needed, to affected areas Patient taking differently: Apply 2 g topically 4 (four) times daily as needed. Apply dime size  three times a day as needed, to affected areas(Pain) 10/23/14   Alycia Rossetti, MD  fenofibrate (TRICOR) 48 MG tablet Take 1 tablet (48 mg total) by mouth daily. Patient not taking: Reported on 01/15/2017 10/20/16   Alycia Rossetti, MD  oxyCODONE-acetaminophen (PERCOCET/ROXICET) 5-325 MG tablet Take 1 tablet by mouth every 6 (six) hours  as needed for moderate pain.  10/20/16   Historical Provider, MD    Scheduled Meds: .  stroke: mapping our early stages of recovery book   Does not apply Once  . aspirin  300 mg Rectal Daily   Or  . aspirin  325 mg Oral Daily  . cloNIDine  0.1 mg Oral BID  . enoxaparin (LOVENOX) injection  40 mg Subcutaneous Q24H  . lisinopril  40 mg Oral Daily   And  . hydrochlorothiazide  25 mg Oral Daily  . insulin aspart  0-15 Units Subcutaneous TID WC  . insulin aspart  0-5 Units Subcutaneous QHS  . insulin glargine  18 Units Subcutaneous Daily  . metoprolol succinate  50 mg Oral Daily   Continuous Infusions: PRN Meds:.acetaminophen **OR** acetaminophen (TYLENOL) oral liquid 160 mg/5 mL **OR** acetaminophen, cyclobenzaprine, diclofenac sodium, oxyCODONE-acetaminophen, senna-docusate     Results for orders placed or performed during the hospital encounter of 01/15/17  (from the past 48 hour(s))  Basic metabolic panel     Status: Abnormal   Collection Time: 01/15/17 10:45 AM  Result Value Ref Range   Sodium 134 (L) 135 - 145 mmol/L   Potassium 4.0 3.5 - 5.1 mmol/L   Chloride 97 (L) 101 - 111 mmol/L   CO2 30 22 - 32 mmol/L   Glucose, Bld 260 (H) 65 - 99 mg/dL   BUN 24 (H) 6 - 20 mg/dL   Creatinine, Ser 1.52 (H) 0.44 - 1.00 mg/dL   Calcium 9.2 8.9 - 10.3 mg/dL   GFR calc non Af Amer 34 (L) >60 mL/min   GFR calc Af Amer 39 (L) >60 mL/min    Comment: (NOTE) The eGFR has been calculated using the CKD EPI equation. This calculation has not been validated in all clinical situations. eGFR's persistently <60 mL/min signify possible Chronic Kidney Disease.    Anion gap 7 5 - 15  CBC     Status: None   Collection Time: 01/15/17 10:45 AM  Result Value Ref Range   WBC 6.4 4.0 - 10.5 K/uL   RBC 4.67 3.87 - 5.11 MIL/uL   Hemoglobin 13.6 12.0 - 15.0 g/dL   HCT 40.2 36.0 - 46.0 %   MCV 86.1 78.0 - 100.0 fL   MCH 29.1 26.0 - 34.0 pg   MCHC 33.8 30.0 - 36.0 g/dL   RDW 13.3 11.5 - 15.5 %   Platelets 212 150 - 400 K/uL  Urinalysis, Routine w reflex microscopic     Status: Abnormal   Collection Time: 01/15/17 12:53 PM  Result Value Ref Range   Color, Urine STRAW (A) YELLOW   APPearance CLEAR CLEAR   Specific Gravity, Urine 1.009 1.005 - 1.030   pH 7.0 5.0 - 8.0   Glucose, UA 150 (A) NEGATIVE mg/dL   Hgb urine dipstick SMALL (A) NEGATIVE   Bilirubin Urine NEGATIVE NEGATIVE   Ketones, ur NEGATIVE NEGATIVE mg/dL   Protein, ur 100 (A) NEGATIVE mg/dL   Nitrite NEGATIVE NEGATIVE   Leukocytes, UA TRACE (A) NEGATIVE   RBC / HPF 0-5 0 - 5 RBC/hpf   WBC, UA 0-5 0 - 5 WBC/hpf   Bacteria, UA RARE (A) NONE SEEN  Glucose, capillary     Status: Abnormal   Collection Time: 01/15/17 10:51 PM  Result Value Ref Range   Glucose-Capillary 200 (H) 65 - 99 mg/dL  Lipid panel     Status: Abnormal   Collection Time: 01/16/17  6:05 AM  Result Value Ref Range  Cholesterol  180 0 - 200 mg/dL   Triglycerides 95 <150 mg/dL   HDL 59 >40 mg/dL   Total CHOL/HDL Ratio 3.1 RATIO   VLDL 19 0 - 40 mg/dL   LDL Cholesterol 102 (H) 0 - 99 mg/dL    Comment:        Total Cholesterol/HDL:CHD Risk Coronary Heart Disease Risk Table                     Men   Women  1/2 Average Risk   3.4   3.3  Average Risk       5.0   4.4  2 X Average Risk   9.6   7.1  3 X Average Risk  23.4   11.0        Use the calculated Patient Ratio above and the CHD Risk Table to determine the patient's CHD Risk.        ATP III CLASSIFICATION (LDL):  <100     mg/dL   Optimal  100-129  mg/dL   Near or Above                    Optimal  130-159  mg/dL   Borderline  160-189  mg/dL   High  >190     mg/dL   Very High   Basic metabolic panel     Status: Abnormal   Collection Time: 01/16/17  6:07 AM  Result Value Ref Range   Sodium 133 (L) 135 - 145 mmol/L   Potassium 4.2 3.5 - 5.1 mmol/L   Chloride 98 (L) 101 - 111 mmol/L   CO2 28 22 - 32 mmol/L   Glucose, Bld 204 (H) 65 - 99 mg/dL   BUN 31 (H) 6 - 20 mg/dL   Creatinine, Ser 2.03 (H) 0.44 - 1.00 mg/dL   Calcium 8.8 (L) 8.9 - 10.3 mg/dL   GFR calc non Af Amer 24 (L) >60 mL/min   GFR calc Af Amer 28 (L) >60 mL/min    Comment: (NOTE) The eGFR has been calculated using the CKD EPI equation. This calculation has not been validated in all clinical situations. eGFR's persistently <60 mL/min signify possible Chronic Kidney Disease.    Anion gap 7 5 - 15    Studies/Results:  BRAIN MRI MRA FINDINGS: MRI HEAD FINDINGS  Brain: Diffusion imaging shows a 7 mm acute infarction in the right lateral thalamus. Punctate acute or subacute infarction in the deep white matter adjacent to the posterior body of the left lateral ventricle. No other acute finding. The brainstem and cerebellum are normal. There are old small vessel infarctions affecting the thalami, basal ganglia and hemispheric deep white matter. Old right frontal cortical/  subcortical infarction. Alternatively, this could be gliosis related to a developmental venous anomaly. No sign of mass lesion, acute hemorrhage, hydrocephalus or extra-axial collection. Some hemosiderin deposition associated with left hemisphere radiating white matter old infarction.  Vascular: Major vessels at the base of the brain show flow.  Skull and upper cervical spine: Negative  Sinuses/Orbits: Clear/normal  Other: None significant  MRA HEAD FINDINGS  Both internal carotid arteries are widely patent into the brain. No siphon stenosis. The anterior and middle cerebral vessels are patent without proximal stenosis, aneurysm or vascular malformation. Distal vessels may show mild atherosclerotic irregularity.  Both vertebral arteries are widely patent to the basilar. No basilar stenosis. Posterior circulation branch vessels appear normal.  IMPRESSION: Acute 7 mm infarction right lateral thalamus. Possible acute  2 mm infarction in the deep white matter adjacent to the posterior body of the left lateral ventricle.  Extensive chronic small vessel ischemic changes elsewhere throughout the brain including old the laminae ankle occur infarctions in an old hemorrhagic infarction of the radiating white matter tracts on the left.  Gliosis in the right frontal subcortical white matter that could be an old infarction or gliosis associated with a developmental venous anomaly.       The brain MRI and MRA are both reviewed in person. The MRI shows an acute thalamic infarct lateral aspect on the left side. It is seen on 3 cuts on DWI. FLAIR imaging shows scattered mild to moderate deep matter leukoencephalopathy. There appears to be a area of susceptibility on SWI involving the left posterior frontal region consistent with microscopic chronic hemorrhage.    Dakin Madani A. Merlene Laughter, M.D.  Diplomate, Tax adviser of Psychiatry and Neurology ( Neurology). 01/16/2017, 7:48 AM

## 2017-01-16 NOTE — Evaluation (Addendum)
Physical Therapy Evaluation Patient Details Name: Amanda Krueger MRN: [id number] DOB: [birth year] Today's Date: 01/16/2017   History of Present Illness  Amanda Krueger is a 71 y.o. female with medical history significant of hypertension, diabetes, presents to the hospital with complaints of right arm numbness. Patient reports that approximately 3 days ago she was lifting heavy wood out of her truck. The following day, she noticed numbness in her left hand and arm numbness. She felt this may be related to lifting wood the prior day. This progressed throughout the day where she felt numb in her entire body. She did not seek medical care at that time. The following day, overall body numbness had improved, but remained in her left upper extremity. She felt that her left arm was also somewhat weak. She has chronic pain in her left knee, but was having increasing difficulty walking, more so than normal. She denies any changes in vision, changes in speech, difficulty swallowing.  MRI positive for acute infarct  Clinical Impression  Pt received in bed, and was agreeable to PT evaluation.  Pt states that for the past week she has been ambulating with a cane, but she is independent with dressing and bathing at baseline.  During PT evaluation she demonstrates generalized weakness with L sided incoordination.  She also continues to c/o numbness and tingling on the L side.  During PT evaluation, she required Min guard for sit<>stand and she ambulated 227f with RW and min guard/supervision.  She demonstrates a mildly ataxic gait pattern.  Recommend that she f/u with OPPT, and use a RW for ambulation due to gait and balance deficits at this time.  No further acute PT needs at this time, will sign off.     Follow Up Recommendations Outpatient PT    Equipment Recommendations  Rolling walker with 5" wheels    Recommendations for Other Services       Precautions / Restrictions Precautions Precautions:  Fall Precaution Comments: 2 falls - tripped over clutter  Restrictions Weight Bearing Restrictions: No      Mobility  Bed Mobility Overal bed mobility: Modified Independent                Transfers Overall transfer level: Needs assistance Equipment used: Rolling walker (2 wheeled) Transfers: Sit to/from Stand Sit to Stand: Min guard         General transfer comment: vc's for hand positioning, and to scoot out to the edge of the bed.  Pt expressed that she has had difficulty with sit<>stand ever since her L knee meniscus surgery.    Ambulation/Gait Ambulation/Gait assistance: Min guard;Supervision Ambulation Distance (Feet): 200 Feet Assistive device: Rolling walker (2 wheeled) Gait Pattern/deviations: Step-through pattern;Ataxic   Gait velocity interpretation: <1.8 ft/sec, indicative of risk for recurrent falls General Gait Details: Pt initially demonstrating guarded gait pattern and not wanting to place full weight through L LE.  Encouraged pt to demonstrate smooth cadence with continuous forward motion, which she was able to perform and maintain, but she expressed that she was not ready to get rid of the walker yet.  L LE is mildly ataxic during gait.   Stairs            Wheelchair Mobility    Modified Rankin (Stroke Patients Only) Modified Rankin (Stroke Patients Only) Pre-Morbid Rankin Score: No symptoms Modified Rankin: Slight disability     Balance Overall balance assessment: History of Falls;Needs assistance Sitting-balance support: Bilateral upper extremity supported;Feet supported Sitting balance-Leahy Scale: Good  Standing balance support: Bilateral upper extremity supported Standing balance-Leahy Scale: Fair                               Pertinent Vitals/Pain Pain Assessment: 0-10 Pain Location: L knee  Pain Descriptors / Indicators: Aching;Constant Pain Intervention(s): Limited activity within patient's  tolerance;Monitored during session;Repositioned    Home Living   Living Arrangements: Other relatives (grandson who is 78 ) Available Help at Discharge: Family;Available PRN/intermittently Type of Home: House Home Access: Stairs to enter   Entrance Stairs-Number of Steps: 3 Home Layout: Two level;Able to live on main level with bedroom/bathroom Home Equipment: Kasandra Knudsen - single point;Shower seat;Toilet riser      Prior Function Level of Independence: Independent with assistive device(s)   Gait / Transfers Assistance Needed: Pt states that she has needed to use the cane for the past week.    ADL's / Homemaking Assistance Needed: independent with dressing and bathing, driving, disabled since 2000.          Hand Dominance   Dominant Hand: Right    Extremity/Trunk Assessment   Upper Extremity Assessment Upper Extremity Assessment: LUE deficits/detail LUE Deficits / Details: c/o numbness and tingling.     Lower Extremity Assessment Lower Extremity Assessment: LLE deficits/detail LLE Deficits / Details: Pt c/o numbness/tingling - ataxia LLE Coordination: decreased gross motor;decreased fine motor       Communication   Communication: No difficulties  Cognition Arousal/Alertness: Awake/alert Behavior During Therapy: WFL for tasks assessed/performed Overall Cognitive Status: Within Functional Limits for tasks assessed                      General Comments      Exercises     Assessment/Plan    PT Assessment All further PT needs can be met in the next venue of care  PT Problem List Decreased activity tolerance;Decreased strength;Decreased balance;Decreased coordination;Decreased mobility;Impaired sensation          PT Treatment Interventions DME instruction;Gait training;Therapeutic activities;Functional mobility training;Therapeutic exercise;Balance training;Patient/family education    PT Goals (Current goals can be found in the Care Plan section)  Acute  Rehab PT Goals PT Goal Formulation: All assessment and education complete, DC therapy    Frequency     Barriers to discharge        Co-evaluation               End of Session Equipment Utilized During Treatment: Gait belt Activity Tolerance: Patient tolerated treatment well Patient left: in bed;with call bell/phone within reach;with family/visitor present Nurse Communication: Mobility status (Mobility sheet left hanging in the room. )    Functional Assessment Tool Used: [place] "6-clicks"  Functional Limitation: Mobility: Walking and moving around Mobility: Walking and Moving Around Current Status [phone]): At least 1 percent but less than 20 percent impaired, limited or restricted Mobility: Walking and Moving Around Goal Status [phone]): At least 1 percent but less than 20 percent impaired, limited or restricted Mobility: Walking and Moving Around Discharge Status [phone]): At least 1 percent but less than 20 percent impaired, limited or restricted    Time: 1321-1350 PT Time Calculation (min) (ACUTE ONLY): 29 min   Charges:   PT Evaluation $PT Eval Low Complexity: 1 Procedure PT Treatments $Gait Training: 8-22 mins   PT G Codes:   PT G-Codes **NOT FOR INPATIENT CLASS** Functional Assessment Tool Used: [employer] "6-clicks"  Functional Limitation: Mobility: Walking  and moving around Mobility: Walking and Moving Around Current Status [phone]): At least 1 percent but less than 20 percent impaired, limited or restricted Mobility: Walking and Moving Around Goal Status [phone]): At least 1 percent but less than 20 percent impaired, limited or restricted Mobility: Walking and Moving Around Discharge Status [phone]): At least 1 percent but less than 20 percent impaired, limited or restricted    Beth Cleve Paolillo, PT, DPT X: [phone]

## 2017-01-17 DIAGNOSIS — N183 Chronic kidney disease, stage 3 (moderate): Secondary | ICD-10-CM

## 2017-01-17 DIAGNOSIS — N179 Acute kidney failure, unspecified: Secondary | ICD-10-CM

## 2017-01-17 DIAGNOSIS — E1122 Type 2 diabetes mellitus with diabetic chronic kidney disease: Secondary | ICD-10-CM

## 2017-01-17 LAB — BASIC METABOLIC PANEL
ANION GAP: 8 (ref 5–15)
BUN: 45 mg/dL — AB (ref 6–20)
CHLORIDE: 98 mmol/L — AB (ref 101–111)
CO2: 28 mmol/L (ref 22–32)
Calcium: 8.4 mg/dL — ABNORMAL LOW (ref 8.9–10.3)
Creatinine, Ser: 2.3 mg/dL — ABNORMAL HIGH (ref 0.44–1.00)
GFR calc Af Amer: 24 mL/min — ABNORMAL LOW (ref >60)
GFR calc non Af Amer: 21 mL/min — ABNORMAL LOW (ref >60)
GLUCOSE: 98 mg/dL (ref 65–99)
POTASSIUM: 4 mmol/L (ref 3.5–5.1)
Sodium: 134 mmol/L — ABNORMAL LOW (ref 135–145)

## 2017-01-17 LAB — GLUCOSE, CAPILLARY
GLUCOSE-CAPILLARY: 169 mg/dL — AB (ref 65–99)
GLUCOSE-CAPILLARY: 145 mg/dL — AB (ref 65–99)
Glucose-Capillary: 87 mg/dL (ref 65–99)
Glucose-Capillary: 171 mg/dL — ABNORMAL HIGH (ref 65–99)

## 2017-01-17 LAB — HEMOGLOBIN A1C
Hgb A1c MFr Bld: 10.5 % — ABNORMAL HIGH (ref 4.8–5.6)
Mean Plasma Glucose: 255 mg/dL

## 2017-01-17 NOTE — Progress Notes (Signed)
Patient ID: Amanda Krueger, female   DOB: 1946-02-03, 71 y.o.   MRN: [id number]  Subjective: 71 y.o. returns the office today for painful, elongated, thickened toenails which she cannot trim herself as well as for calluses to her left foot which continue to come back. Denies any redness or drainage around the nails or callus sites. Denies any acute changes since last appointment and no new complaints today. Denies any systemic complaints such as fevers, chills, nausea, vomiting.   Objective: AAO 3, NAD DP/PT pulses palpable, CRT less than 3 seconds Sensation decreased with SWMF.  Nails hypertrophic, dystrophic, elongated, brittle, discolored 10. There is tenderness overlying the nails 1-5 bilaterally. There is no surrounding erythema or drainage along the nail sites.  Hyperkeratotic lesions left foot sub-metatarsal 1and 5. Upon debridement there is no underlying ulceration, drainage or other signs of infection today. No open lesions or  other pre-ulcerative lesions are identified. Hammertoes present b/l.  No other areas of tenderness bilateral lower extremities. No overlying edema, erythema, increased warmth. No pain with calf compression, swelling, warmth, erythema.  Assessment: Patient presents with symptomatic onychomycosis; hyperkeratotic lesions  Plan: -Treatment options including alternatives, risks, complications were discussed -Nails sharply debrided without complication/bleeding. -Hyperkeratotic lesion debrided 2 without complications or bleeding. Continue offloading pads.  -Discussed daily foot inspection. If there are any changes, to call the office immediately.  -Follow-up in 9 weeks or sooner if any problems are to arise. In the meantime, encouraged to call the office with any questions, concerns, changes symptoms.  Celesta Gentile, DPM

## 2017-01-17 NOTE — Progress Notes (Signed)
PROGRESS NOTE    Amanda Krueger  HAL:[id number] DOB: 07-08-1946 DOA: 01/15/2017 PCP: Vic Blackbird, MD    Brief Narrative:  71 year old female with history of hypertension, diabetes mellitus who is noncompliant with her medications, presents with left hand and arm numbness. She was seen in the emergency room and found to have acute stroke. She was admitted for further treatment. She's also developed worsening creatinine and has been started on IV fluids. If her renal function has improved by tomorrow, she could potentially discharge home.   Assessment & Plan:   Active Problems:   HTN (hypertension)   CKD stage 3 due to type 2 diabetes mellitus (HCC)   Diabetes mellitus due to underlying condition with stage 3 chronic kidney disease, with long-term current use of insulin (HCC)   Stroke (HCC)   Left arm numbness   Stroke (cerebrum) (HCC)   AKI (acute kidney injury) ([place])   1. Acute stroke. MRI confirms acute 31mm infarction in right lateral thalamus. Seen by neurology who recommends full dose aspirin. Carotid Dopplers are unremarkable for significant stenosis. Echocardiogram also unremarkable. She's been seen by physical therapy. LDL is elevated at 102. She has not tolerated statins in the past. She wishes to discuss this further with her primary care physician. A1c in process.  2. AKI on CKD stage III. Possibly related to elevated blood pressure on admission. Hold ACE inhibitor for now. Start on IV fluids 1/30--have explained her that we will need essentially excellent control both diabetes and hypertension We will trend her creatinine  3. Diabetes. Blood sugars have been stable. Continue Lantus 18 and sliding scale insulin.  4. Hypertension. Patient has been noncompliant with her medications. She presented with severe hypertension with a systolic blood pressure greater than 200. We started on clonidine and metoprolol. Lisinopril currently on hold due to renal failure.   DVT  prophylaxis: lovenox Code Status: full Family Communication: no family present Disposition Plan: discharge home once renal function improving   Consultants:   Neurology  Procedures:  Echo: Left ventricle: The cavity size was normal. Wall thickness was   increased in a pattern of severe LVH. Systolic function was   normal. The estimated ejection fraction was in the range of 50%   to 55%. Wall motion was normal; there were no regional wall   motion abnormalities. Doppler parameters are consistent with   abnormal left ventricular relaxation (grade 1 diastolic   dysfunction). Doppler parameters are consistent with high   ventricular filling pressure.  - Atrial septum: No defect or patent foramen ovale was identified  Antimicrobials:      Subjective:  No new issues overall feels well  Objective: Vitals:   01/17/17 0030 01/17/17 0430 01/17/17 0830 01/17/17 1230  BP: 131/60 (!) 137/58 (!) 124/46 122/70  Pulse: (!) 57 (!) 56  (!) 57  Resp: 18 18 18 18   Temp: 98 F (36.7 C) 97.5 F (36.4 C) 98.3 F (36.8 C)   TempSrc: Oral Oral Oral Oral  SpO2: 100% 100% 100% 98%  Weight:      Height:        Intake/Output Summary (Last 24 hours) at 01/17/17 1434 Last data filed at 01/17/17 0800  Gross per 24 hour  Intake          1108.33 ml  Output                0 ml  Net          1108.33 ml   Danley Danker  Weights   01/15/17 1043 01/15/17 1847  Weight: 79.8 kg (176 lb) 77.2 kg (170 lb 4.8 oz)    Examination:  General exam: Appears calm and comfortable  Respiratory system: Clear to auscultation. Respiratory effort normal. Cardiovascular system: S1 & S2 heard, RRR. No JVD, murmurs, rubs, gallops or clicks. No pedal edema. Gastrointestinal system: Abdomen is nondistended, soft and nontender. No organomegaly or masses felt. Normal bowel sounds heard. Central nervous system: Alert and oriented. No focal neurological deficits. Extremities: Symmetric 5 x 5 power. Skin: No rashes, lesions  or ulcers Psychiatry: Judgement and insight appear normal. Mood & affect appropriate.     Data Reviewed: I have personally reviewed following labs and imaging studies  CBC:  Recent Labs Lab 01/15/17 1045  WBC 6.4  HGB 13.6  HCT 40.2  MCV 86.1  PLT 196   Basic Metabolic Panel:  Recent Labs Lab 01/15/17 1045 01/16/17 0607 01/17/17 0545  NA 134* 133* 134*  K 4.0 4.2 4.0  CL 97* 98* 98*  CO2 30 28 28   GLUCOSE 260* 204* 98  BUN 24* 31* 45*  CREATININE 1.52* 2.03* 2.30*  CALCIUM 9.2 8.8* 8.4*   GFR: Estimated Creatinine Clearance: 25 mL/min (by C-G formula based on SCr of 2.3 mg/dL (H)). Liver Function Tests: No results for input(s): AST, ALT, ALKPHOS, BILITOT, PROT, ALBUMIN in the last 168 hours. No results for input(s): LIPASE, AMYLASE in the last 168 hours. No results for input(s): AMMONIA in the last 168 hours. Coagulation Profile: No results for input(s): INR, PROTIME in the last 168 hours. Cardiac Enzymes: No results for input(s): CKTOTAL, CKMB, CKMBINDEX, TROPONINI in the last 168 hours. BNP (last 3 results) No results for input(s): PROBNP in the last 8760 hours. HbA1C:  Recent Labs  01/15/17 1046  HGBA1C 10.5*   CBG:  Recent Labs Lab 01/16/17 1128 01/16/17 1625 01/16/17 2107 01/17/17 0720 01/17/17 1136  GLUCAP 176* 116* 204* 87 169*   Lipid Profile:  Recent Labs  01/16/17 0605  CHOL 180  HDL 59  LDLCALC 102*  TRIG 95  CHOLHDL 3.1   Thyroid Function Tests: No results for input(s): TSH, T4TOTAL, FREET4, T3FREE, THYROIDAB in the last 72 hours. Anemia Panel: No results for input(s): VITAMINB12, FOLATE, FERRITIN, TIBC, IRON, RETICCTPCT in the last 72 hours. Sepsis Labs: No results for input(s): PROCALCITON, LATICACIDVEN in the last 168 hours.  No results found for this or any previous visit (from the past 240 hour(s)).       Radiology Studies: US Carotid Bilateral (at [facility] Only)  Result Date: 01/16/2017 CLINICAL DATA:   CVA. EXAM: BILATERAL CAROTID DUPLEX ULTRASOUND TECHNIQUE: Pearline Cables scale imaging, color Doppler and duplex ultrasound were performed of bilateral carotid and vertebral arteries in the neck. COMPARISON:  MRI 01/15/2017. FINDINGS: Criteria: Quantification of carotid stenosis is based on velocity parameters that correlate the residual internal carotid diameter with NASCET-based stenosis levels, using the diameter of the distal internal carotid lumen as the denominator for stenosis measurement. The following velocity measurements were obtained: RIGHT ICA:  67/20 cm/sec CCA:  22/2 cm/sec SYSTOLIC ICA/CCA RATIO:  0.8 DIASTOLIC ICA/CCA RATIO:  2.2 ECA:  67 cm/sec LEFT ICA:  68/20 cm/sec CCA:  97/98 cm/sec SYSTOLIC ICA/CCA RATIO:  1.0 DIASTOLIC ICA/CCA RATIO:  2.0 ECA:  81 cm/sec RIGHT CAROTID ARTERY: No significant carotid atherosclerotic vascular disease. RIGHT VERTEBRAL ARTERY:  Patent with antegrade flow. LEFT CAROTID ARTERY: No significant carotid atherosclerotic vascular disease. LEFT VERTEBRAL ARTERY:  Patent with antegrade flow. IMPRESSION: 1.  No significant carotid atherosclerotic vascular disease noted. Carotid arteries are widely patent bilaterally. 2. Vertebral arteries are patent with antegrade flow. Electronically Signed   By: Newington Forest   On: 01/16/2017 12:01        Scheduled Meds: . aspirin  300 mg Rectal Daily   Or  . aspirin  325 mg Oral Daily  . cloNIDine  0.1 mg Oral BID  . enoxaparin (LOVENOX) injection  30 mg Subcutaneous Q24H  . insulin aspart  0-15 Units Subcutaneous TID WC  . insulin aspart  0-5 Units Subcutaneous QHS  . insulin glargine  18 Units Subcutaneous Daily  . metoprolol succinate  50 mg Oral Daily   Continuous Infusions: . sodium chloride 100 mL/hr at 01/17/17 0607     LOS: 2 days    Time spent: 68mins    Verneita Griffes, MD [facility] (P) [phone]   If 7PM-7AM, please contact night-coverage [email or web address] Password [facility] 01/17/2017, 2:34 PM

## 2017-01-18 LAB — BASIC METABOLIC PANEL
ANION GAP: 7 (ref 5–15)
BUN: 42 mg/dL — ABNORMAL HIGH (ref 6–20)
CHLORIDE: 103 mmol/L (ref 101–111)
CO2: 24 mmol/L (ref 22–32)
Calcium: 8.1 mg/dL — ABNORMAL LOW (ref 8.9–10.3)
Creatinine, Ser: 1.75 mg/dL — ABNORMAL HIGH (ref 0.44–1.00)
GFR calc non Af Amer: 29 mL/min — ABNORMAL LOW (ref >60)
GFR, EST AFRICAN AMERICAN: 33 mL/min — AB (ref >60)
Glucose, Bld: 156 mg/dL — ABNORMAL HIGH (ref 65–99)
Potassium: 4 mmol/L (ref 3.5–5.1)
Sodium: 134 mmol/L — ABNORMAL LOW (ref 135–145)

## 2017-01-18 LAB — GLUCOSE, CAPILLARY: GLUCOSE-CAPILLARY: 135 mg/dL — AB (ref 65–99)

## 2017-01-18 MED ORDER — ASPIRIN 325 MG PO TABS: 325.0000 mg | ORAL_TABLET | Freq: Every day | ORAL | 12 refills | Status: AC

## 2017-01-18 MED ORDER — CLONIDINE HCL 0.1 MG PO TABS
0.1000 mg | ORAL_TABLET | Freq: Two times a day (BID) | ORAL | 11 refills | Status: DC
Start: 2017-01-18 — End: 2017-03-02

## 2017-01-18 MED ORDER — AMLODIPINE BESYLATE 10 MG PO TABS: 10.0000 mg | ORAL_TABLET | Freq: Every day | ORAL | 1 refills | Status: DC

## 2017-01-18 NOTE — Care Management (Signed)
CM contacted by [facility] OP rehab. Pt's OP PT referral must come from pt's PCP office. CM attempted to contact pt via phone to notify her but unable to leave VM. msg sent to pt's PCP office.

## 2017-01-18 NOTE — Discharge Summary (Signed)
Physician Discharge Summary  OSWIN JOHAL KCL:[id number] DOB: August 18, 1946 DOA: 01/15/2017  PCP: Vic Blackbird, MD  Admit date: 01/15/2017 Discharge date: 01/18/2017  Time spent: 35 minutes  Recommendations for Outpatient Follow-up:  1. Dyazide has been changed to amlodipine 2. Needs basic metabolic panel in about one week 3. Follow blood pressure trends as an outpatient and adjust meds accordingly 4. Patient will also need outpatient therapy with PT OT 5. Please discuss with patient alternatives 2 statins as patient is allergic  Discharge Diagnoses:  Active Problems:   HTN (hypertension)   CKD stage 3 due to type 2 diabetes mellitus (HCC)   Diabetes mellitus due to underlying condition with stage 3 chronic kidney disease, with long-term current use of insulin (HCC)   Stroke (HCC)   Left arm numbness   Stroke (cerebrum) (HCC)   AKI (acute kidney injury) [facility])   Discharge Condition: Improved  Diet recommendation: Heart healthy low-salt  Filed Weights   01/15/17 1043 01/15/17 1847  Weight: 79.8 kg (176 lb) 77.2 kg (170 lb 4.8 oz)    History of present illness:  71 year old female with history of hypertension, diabetes mellitus who is noncompliant with her medications, presents with left hand and arm numbness. She was seen in the emergency room and found to have acute stroke.   Hospital Course:  1. Acute stroke. MRI confirms acute 83m infarction in right lateral thalamus. Seen by neurology who recommends full dose aspirin. Carotid Dopplers are unremarkable for significant stenosis. Echocardiogram also unremarkable. She's been seen by physical therapy who have recommended outpatient therapy. LDL is elevated at 102. She has not tolerated statins in the past. She wishes to discuss this further with her primary care physician. A1c in process.  2. AKI on CKD stage III. Possibly related to elevated blood pressure on admission. Hold ACE inhibitor for now. Start on IV fluids 1/30--have  explained her that we will need essentially excellent control both diabetes and hypertension We will trend her creatinine  3. Diabetes. Blood sugars have been stable. Continue Lantus 18 and sliding scale insulin.  4. Hypertension. Patient has been noncompliant with her medications. She presented with severe hypertension with a systolic blood pressure greater than 200. We started on clonidine and metoprolol. Lisinopril currently on hold due to renal failure. Amlodipine 10 mg was substituted on discharge  Procedures:  multiple (i.e. Studies not automatically included, echos, thoracentesis, etc; not x-rays)  Consultations:  neurology  Discharge Exam: Vitals:   01/18/17 0030 01/18/17 0430  BP: 125/61 (!) 173/61  Pulse: 61 64  Resp: 18 18  Temp: 98.2 F (36.8 C) 98.3 F (36.8 C)    General: eomi ncat no issue overnight Cardiovascular: s1 s2 no m/r/g Respiratory: clear no added sound Neuro iontact moves 4 limbs equally  Discharge Instructions    Current Discharge Medication List    START taking these medications   Details  amLODipine (NORVASC) 10 MG tablet Take 1 tablet (10 mg total) by mouth daily. Qty: 30 tablet, Refills: 1    aspirin 325 MG tablet Take 1 tablet (325 mg total) by mouth daily. Qty: 30 tablet, Refills: 12      CONTINUE these medications which have CHANGED   Details  cloNIDine (CATAPRES) 0.1 MG tablet Take 1 tablet (0.1 mg total) by mouth 2 (two) times daily. Qty: 60 tablet, Refills: 11      CONTINUE these medications which have NOT CHANGED   Details  fish oil-omega-3 fatty acids 1000 MG capsule Take 1 g by  mouth daily.     insulin glargine (LANTUS) 100 UNIT/ML injection INJECT 18 UNITS SUBCUTANEOUSLY in am Qty: 10 mL, Refills: 11    metoprolol succinate (TOPROL-XL) 50 MG 24 hr tablet Take 1 tablet (50 mg total) by mouth daily. Take with or immediately following a meal. Qty: 90 tablet, Refills: 3    NOVOLIN R RELION 100 UNIT/ML injection  INJECT(0.04-0.06 MLS) 4-6 UNITS SUBCUTANEOUSLY THREE TIMES DAILY BEFORE MEAL(S) Qty: 10 mL, Refills: 2    ACCU-CHEK SMARTVIEW test strip 1 each by Other route 3 (three) times daily.     Blood Glucose Monitoring Suppl (ACCU-CHEK NANO SMARTVIEW) W/DEVICE KIT     diclofenac sodium (VOLTAREN) 1 % GEL Apply dime size  three times a day as needed, to affected areas Qty: 1 Tube, Refills: 3    fenofibrate (TRICOR) 48 MG tablet Take 1 tablet (48 mg total) by mouth daily. Qty: 30 tablet, Refills: 3    oxyCODONE-acetaminophen (PERCOCET/ROXICET) 5-325 MG tablet Take 1 tablet by mouth every 6 (six) hours as needed for moderate pain.       STOP taking these medications     aspirin EC 81 MG tablet      lisinopril-hydrochlorothiazide (PRINZIDE,ZESTORETIC) 20-12.5 MG tablet      ACCU-CHEK FASTCLIX LANCETS MISC      cyclobenzaprine (FLEXERIL) 5 MG tablet      diclofenac (CATAFLAM) 50 MG tablet        Allergies  Allergen Reactions  . Statins Other (See Comments)    Joint pain      The results of significant diagnostics from this hospitalization (including imaging, microbiology, ancillary and laboratory) are listed below for reference.    Significant Diagnostic Studies: Mr Virgel Paling SJ Contrast  Result Date: 01/15/2017 CLINICAL DATA:  Left-sided weakness over the last 2 days. EXAM: MRI HEAD WITHOUT CONTRAST MRA HEAD WITHOUT CONTRAST TECHNIQUE: Multiplanar, multiecho pulse sequences of the brain and surrounding structures were obtained without intravenous contrast. Angiographic images of the head were obtained using MRA technique without contrast. COMPARISON:  None. FINDINGS: MRI HEAD FINDINGS Brain: Diffusion imaging shows a 7 mm acute infarction in the right lateral thalamus. Punctate acute or subacute infarction in the deep white matter adjacent to the posterior body of the left lateral ventricle. No other acute finding. The brainstem and cerebellum are normal. There are old small vessel  infarctions affecting the thalami, basal ganglia and hemispheric deep white matter. Old right frontal cortical/ subcortical infarction. Alternatively, this could be gliosis related to a developmental venous anomaly. No sign of mass lesion, acute hemorrhage, hydrocephalus or extra-axial collection. Some hemosiderin deposition associated with left hemisphere radiating white matter old infarction. Vascular: Major vessels at the base of the brain show flow. Skull and upper cervical spine: Negative Sinuses/Orbits: Clear/normal Other: None significant MRA HEAD FINDINGS Both internal carotid arteries are widely patent into the brain. No siphon stenosis. The anterior and middle cerebral vessels are patent without proximal stenosis, aneurysm or vascular malformation. Distal vessels may show mild atherosclerotic irregularity. Both vertebral arteries are widely patent to the basilar. No basilar stenosis. Posterior circulation branch vessels appear normal. IMPRESSION: Acute 7 mm infarction right lateral thalamus. Possible acute 2 mm infarction in the deep white matter adjacent to the posterior body of the left lateral ventricle. Extensive chronic small vessel ischemic changes elsewhere throughout the brain including old the laminae ankle occur infarctions in an old hemorrhagic infarction of the radiating white matter tracts on the left. Gliosis in the right frontal subcortical white matter  that could be an old infarction or gliosis associated with a developmental venous anomaly. Electronically Signed   By: Nelson Chimes M.D.   On: 01/15/2017 12:54   Mr Brain Wo Contrast  Result Date: 01/15/2017 CLINICAL DATA:  Left-sided weakness over the last 2 days. EXAM: MRI HEAD WITHOUT CONTRAST MRA HEAD WITHOUT CONTRAST TECHNIQUE: Multiplanar, multiecho pulse sequences of the brain and surrounding structures were obtained without intravenous contrast. Angiographic images of the head were obtained using MRA technique without contrast.  COMPARISON:  None. FINDINGS: MRI HEAD FINDINGS Brain: Diffusion imaging shows a 7 mm acute infarction in the right lateral thalamus. Punctate acute or subacute infarction in the deep white matter adjacent to the posterior body of the left lateral ventricle. No other acute finding. The brainstem and cerebellum are normal. There are old small vessel infarctions affecting the thalami, basal ganglia and hemispheric deep white matter. Old right frontal cortical/ subcortical infarction. Alternatively, this could be gliosis related to a developmental venous anomaly. No sign of mass lesion, acute hemorrhage, hydrocephalus or extra-axial collection. Some hemosiderin deposition associated with left hemisphere radiating white matter old infarction. Vascular: Major vessels at the base of the brain show flow. Skull and upper cervical spine: Negative Sinuses/Orbits: Clear/normal Other: None significant MRA HEAD FINDINGS Both internal carotid arteries are widely patent into the brain. No siphon stenosis. The anterior and middle cerebral vessels are patent without proximal stenosis, aneurysm or vascular malformation. Distal vessels may show mild atherosclerotic irregularity. Both vertebral arteries are widely patent to the basilar. No basilar stenosis. Posterior circulation branch vessels appear normal. IMPRESSION: Acute 7 mm infarction right lateral thalamus. Possible acute 2 mm infarction in the deep white matter adjacent to the posterior body of the left lateral ventricle. Extensive chronic small vessel ischemic changes elsewhere throughout the brain including old the laminae ankle occur infarctions in an old hemorrhagic infarction of the radiating white matter tracts on the left. Gliosis in the right frontal subcortical white matter that could be an old infarction or gliosis associated with a developmental venous anomaly. Electronically Signed   By: Nelson Chimes M.D.   On: 01/15/2017 12:54   US Carotid Bilateral (at [facility] Only)  Result Date: 01/16/2017 CLINICAL DATA:  CVA. EXAM: BILATERAL CAROTID DUPLEX ULTRASOUND TECHNIQUE: Pearline Cables scale imaging, color Doppler and duplex ultrasound were performed of bilateral carotid and vertebral arteries in the neck. COMPARISON:  MRI 01/15/2017. FINDINGS: Criteria: Quantification of carotid stenosis is based on velocity parameters that correlate the residual internal carotid diameter with NASCET-based stenosis levels, using the diameter of the distal internal carotid lumen as the denominator for stenosis measurement. The following velocity measurements were obtained: RIGHT ICA:  67/20 cm/sec CCA:  69/7 cm/sec SYSTOLIC ICA/CCA RATIO:  0.8 DIASTOLIC ICA/CCA RATIO:  2.2 ECA:  67 cm/sec LEFT ICA:  68/20 cm/sec CCA:  94/80 cm/sec SYSTOLIC ICA/CCA RATIO:  1.0 DIASTOLIC ICA/CCA RATIO:  2.0 ECA:  81 cm/sec RIGHT CAROTID ARTERY: No significant carotid atherosclerotic vascular disease. RIGHT VERTEBRAL ARTERY:  Patent with antegrade flow. LEFT CAROTID ARTERY: No significant carotid atherosclerotic vascular disease. LEFT VERTEBRAL ARTERY:  Patent with antegrade flow. IMPRESSION: 1. No significant carotid atherosclerotic vascular disease noted. Carotid arteries are widely patent bilaterally. 2. Vertebral arteries are patent with antegrade flow. Electronically Signed   By: Marcello Moores  Register   On: 01/16/2017 12:01    Microbiology: No results found for this or any previous visit (from the past 240 hour(s)).   Labs: Basic Metabolic Panel:  Recent Labs  Lab 01/15/17 1045 01/16/17 0607 01/17/17 0545 01/18/17 0531  NA 134* 133* 134* 134*  K 4.0 4.2 4.0 4.0  CL 97* 98* 98* 103  CO2 '30 28 28 24  ' GLUCOSE 260* 204* 98 156*  BUN 24* 31* 45* 42*  CREATININE 1.52* 2.03* 2.30* 1.75*  CALCIUM 9.2 8.8* 8.4* 8.1*   Liver Function Tests: No results for input(s): AST, ALT, ALKPHOS, BILITOT, PROT, ALBUMIN in the last 168 hours. No results for input(s): LIPASE, AMYLASE in the last 168 hours. No results  for input(s): AMMONIA in the last 168 hours. CBC:  Recent Labs Lab 01/15/17 1045  WBC 6.4  HGB 13.6  HCT 40.2  MCV 86.1  PLT 212   Cardiac Enzymes: No results for input(s): CKTOTAL, CKMB, CKMBINDEX, TROPONINI in the last 168 hours. BNP: BNP (last 3 results) No results for input(s): BNP in the last 8760 hours.  ProBNP (last 3 results) No results for input(s): PROBNP in the last 8760 hours.  CBG:  Recent Labs Lab 01/16/17 2107 01/17/17 0720 01/17/17 1136 01/17/17 1633 01/17/17 2058  GLUCAP 204* 87 169* 145* 171*       Signed:  Nita Sells MD   [facility] 01/18/2017, 7:36 AM

## 2017-01-18 NOTE — Care Management Important Message (Signed)
Important Message  Patient Details  Name: Amanda Krueger MRN: [id number] Date of Birth: [birth year]   Medicare Important Message Given:  Yes    Sherald Barge, RN 01/18/2017, 11:49 AM

## 2017-01-18 NOTE — Progress Notes (Signed)
IV removed, WNL. D.C instructions given to pt. Verbalized understanding. Awaiting family member to take home.

## 2017-01-18 NOTE — Care Management Note (Signed)
Case Management Note  Patient Details  Name: Amanda Krueger MRN: [id number] Date of Birth: 1946/01/01  Subjective/Objective:                  Pt admitted with CVA. She is from home, lives with grandson. She has sister who is a Marine scientist and very supportive. She was ind with ADL's PTA. She drove prior to CVA. She plans to return home with self care. PT has recommended OP PT/OT and pt agreeable. Referral sent to [facility] OP rehab at pt's request. Pt not interested in ordering RW, she will borrow one from sister.   Action/Plan: Pt discharging home today with self care and OP PT referral.   Expected Discharge Date:  01/18/17               Expected Discharge Plan:  Home/Self Care  In-House Referral:  NA  Discharge planning Services  CM Consult  Post Acute Care Choice:  NA Choice offered to:  NA  Status of Service:  Completed, signed off   Sherald Barge, RN 01/18/2017, 11:49 AM

## 2017-01-19 ENCOUNTER — Telehealth: Payer: Self-pay | Admitting: Family Medicine

## 2017-01-19 ENCOUNTER — Other Ambulatory Visit: Payer: Self-pay | Admitting: Family Medicine

## 2017-01-19 DIAGNOSIS — I639 Cerebral infarction, unspecified: Secondary | ICD-10-CM

## 2017-01-19 DIAGNOSIS — R531 Weakness: Secondary | ICD-10-CM

## 2017-01-19 DIAGNOSIS — R260 Ataxic gait: Secondary | ICD-10-CM

## 2017-01-19 NOTE — Telephone Encounter (Signed)
Referral sent to [facility] for PT/OT

## 2017-01-19 NOTE — Telephone Encounter (Signed)
-----   Message from Alycia Rossetti, MD sent at 01/19/2017  8:43 AM EST ----- Regarding: FW: Hospital unable to make OP PT referral  Please get this arranged or see what the issue is ----- Message ----- From: Girard Cooter, RN Sent: 01/18/2017   2:24 PM To: Alycia Rossetti, MD Subject: Hospital unable to make OP PT referral         Pt in hospital with CVA and Left sided weakness. PT has recommended OP PT. Pt agreeable but wanted to use [facility] OP Rehab in [place]. Referral faxed to them but case manager received called letting us know the referral had to come from PCP's office. I attempted to contact pt to let her know but was unable to contact her by phone or leave VM.

## 2017-02-01 ENCOUNTER — Ambulatory Visit (INDEPENDENT_AMBULATORY_CARE_PROVIDER_SITE_OTHER): Payer: Medicare PPO | Admitting: Family Medicine

## 2017-02-01 ENCOUNTER — Encounter: Payer: Self-pay | Admitting: Family Medicine

## 2017-02-01 ENCOUNTER — Other Ambulatory Visit (HOSPITAL_COMMUNITY): Payer: Self-pay | Admitting: Nephrology

## 2017-02-01 VITALS — BP 158/62 | HR 74 | Temp 98.8°F | Resp 14 | Ht 70.0 in | Wt 179.0 lb

## 2017-02-01 DIAGNOSIS — Z794 Long term (current) use of insulin: Secondary | ICD-10-CM | POA: Diagnosis not present

## 2017-02-01 DIAGNOSIS — N183 Chronic kidney disease, stage 3 unspecified: Secondary | ICD-10-CM

## 2017-02-01 DIAGNOSIS — E0822 Diabetes mellitus due to underlying condition with diabetic chronic kidney disease: Secondary | ICD-10-CM | POA: Diagnosis not present

## 2017-02-01 DIAGNOSIS — E1122 Type 2 diabetes mellitus with diabetic chronic kidney disease: Secondary | ICD-10-CM | POA: Diagnosis not present

## 2017-02-01 DIAGNOSIS — E7800 Pure hypercholesterolemia, unspecified: Secondary | ICD-10-CM

## 2017-02-01 DIAGNOSIS — I639 Cerebral infarction, unspecified: Secondary | ICD-10-CM | POA: Diagnosis not present

## 2017-02-01 DIAGNOSIS — M5136 Other intervertebral disc degeneration, lumbar region: Secondary | ICD-10-CM | POA: Diagnosis not present

## 2017-02-01 DIAGNOSIS — I10 Essential (primary) hypertension: Secondary | ICD-10-CM

## 2017-02-01 LAB — BASIC METABOLIC PANEL
BUN: 28 mg/dL — ABNORMAL HIGH (ref 7–25)
CHLORIDE: 99 mmol/L (ref 98–110)
CO2: 25 mmol/L (ref 20–31)
Calcium: 8.6 mg/dL (ref 8.6–10.4)
Creat: 1.66 mg/dL — ABNORMAL HIGH (ref 0.50–0.99)
GLUCOSE: 320 mg/dL — AB (ref 70–99)
Potassium: 4.6 mmol/L (ref 3.5–5.3)
Sodium: 133 mmol/L — ABNORMAL LOW (ref 135–146)

## 2017-02-01 MED ORDER — PRAVASTATIN SODIUM 40 MG PO TABS: 40.0000 mg | ORAL_TABLET | Freq: Every day | ORAL | 3 refills | Status: DC

## 2017-02-01 MED ORDER — OXYCODONE-ACETAMINOPHEN 5-325 MG PO TABS: 1.0000 | ORAL_TABLET | Freq: Four times a day (QID) | ORAL | 0 refills | Status: DC | PRN

## 2017-02-01 NOTE — Assessment & Plan Note (Signed)
I think her pain which she has seen orthopedics is from her degenerative disc disease she also has a bad knee on that left side. I refilled her pain medication. I do not think there is anything we can intervene on at this time. She is to concentrate on her diabetes and her blood pressure which has led to this recent stroke.

## 2017-02-01 NOTE — Assessment & Plan Note (Signed)
States her CBG are good, did not give any values has endocrine appt in a few weeks States she is injecting but no change in A1C noted

## 2017-02-01 NOTE — Assessment & Plan Note (Signed)
He is willing to try another statin will try her on pravastatin if she cannot tolerate this we'll send her for one of the injectable cholesterol medications. We'll have her start with 3 times a week and help which she can build up to daily with a statin drug.

## 2017-02-01 NOTE — Assessment & Plan Note (Signed)
Recently Was with nephrology will recheck her renal function and hopefully she is back at her baseline. I will for these labs to her nephrologist as well

## 2017-02-01 NOTE — Progress Notes (Signed)
Subjective:    Patient ID: Amanda Krueger, female    DOB: 11/27/1946, 71 y.o.   MRN: [id number]  Patient presents for Hospital F/U (stroke) Patient here for a hospital follow up  secondary to stroke (acute thalamus infarct and deep matter infarct left side) which occurred on January 29. Day prior to she had helped a friend move some wood and felt okay. The next day she decided to take a statin, then her hands starting getting numb and body/face started going numb, was unable to walk, called our triage, sent to ER. She was more concerned about a knot/pain in left hip, she has known severe OA left knee needs surgery    She is uncontrolled diabetes mellitus hypertension chronic kidney disease. Her A1c was 10.5% in the hospital. Oswaldo Milian was switched to amlodipine during admission due to acute on chronic kidney disease she was given fluid bolus which helped. Her creatinine at discharge was 1.75 she is due for repeat metabolic panel today.  She was set with outpatient physical therapy and occupational therapyShe states that she told them to hold off because she was in pain that they're planning to come next week.  She's been intolerable of statins   Compliance in general has been the main issue with her diabetes and her blood pressure. When she presented to the emergency room her systolic blood pressure was greater than 200.   Hospital admission reviewed- carotids patent  Neprhology - Dr. Alyson Locket- recently established care in Jan  Review Of Systems:  GEN- denies fatigue, fever, weight loss,weakness, recent illness HEENT- denies eye drainage, change in vision, nasal discharge, CVS- denies chest pain, palpitations RESP- denies SOB, cough, wheeze ABD- denies N/V, change in stools, abd pain GU- denies dysuria, hematuria, dribbling, incontinence MSK- + joint pain, muscle aches, injury Neuro- denies headache, dizziness, syncope, seizure activity       Objective:    BP (!) 158/62   Pulse  74   Temp 98.8 F (37.1 C) (Oral)   Resp 14   Ht 5\' 10"  (1.778 m)   Wt 179 lb (81.2 kg)   SpO2 98%   BMI 25.68 kg/m  GEN- NAD, alert and oriented x3 HEENT- PERRL, EOMI, non injected sclera, pink conjunctiva, MMM, oropharynx clear CVS- RRR, no murmur RESP-CTAB ABD-NABS,soft,NT,ND Neuro- decreased strength LLE compared to right , ? Pain related,CNII-XII in tact no deficits  MSK- TTP lumbar spine, decreased ROM spine, hips, knees  EXT- No edema Pulses- Radial  2+        Assessment & Plan:      Problem List Items Addressed This Visit    Stroke [facility])    Recent CVA, also with old lesions On ASA 325mg  once a day  Blood pressure still uncontrolled, she states better at home, have her check home readings report in 1 week BP is better than hospital admission, states compliance with medications but I do question this and some levels before discharge were in the 720-947'S systolic      Relevant Medications   pravastatin (PRAVACHOL) 40 MG tablet   Hyperlipidemia - Primary    He is willing to try another statin will try her on pravastatin if she cannot tolerate this we'll send her for one of the injectable cholesterol medications. We'll have her start with 3 times a week and help which she can build up to daily with a statin drug.      Relevant Medications   pravastatin (PRAVACHOL) 40 MG tablet   HTN (  hypertension)   Relevant Medications   pravastatin (PRAVACHOL) 40 MG tablet   Diabetes mellitus due to underlying condition with stage 3 chronic kidney disease, with long-term current use of insulin ([place])    States her CBG are good, did not give any values has endocrine appt in a few weeks States she is injecting but no change in A1C noted      Relevant Medications   pravastatin (PRAVACHOL) 40 MG tablet   DDD (degenerative disc disease), lumbar    I think her pain which she has seen orthopedics is from her degenerative disc disease she also has a bad knee on that left side. I  refilled her pain medication. I do not think there is anything we can intervene on at this time. She is to concentrate on her diabetes and her blood pressure which has led to this recent stroke.      Relevant Medications   oxyCODONE-acetaminophen (PERCOCET/ROXICET) 5-325 MG tablet   CKD stage 3 due to type 2 diabetes mellitus ([place])    Recently Was with nephrology will recheck her renal function and hopefully she is back at her baseline. I will for these labs to her nephrologist as well      Relevant Medications   pravastatin (PRAVACHOL) 40 MG tablet   Other Relevant Orders   Basic metabolic panel      Note: This dictation was prepared with Dragon dictation along with smaller phrase technology. Any transcriptional errors that result from this process are unintentional.

## 2017-02-01 NOTE — Patient Instructions (Addendum)
We will call for blood pressure  We will check kidney function Pain medication given Miralax 1 cap full daily  Try the linzess  Try the pravastatin F/U 4  months

## 2017-02-01 NOTE — Assessment & Plan Note (Signed)
Recent CVA, also with old lesions On ASA 325mg  once a day  Blood pressure still uncontrolled, she states better at home, have her check home readings report in 1 week BP is better than hospital admission, states compliance with medications but I do question this and some levels before discharge were in the 052-591'G systolic

## 2017-02-05 ENCOUNTER — Ambulatory Visit (INDEPENDENT_AMBULATORY_CARE_PROVIDER_SITE_OTHER): Payer: Medicare PPO | Admitting: Podiatry

## 2017-02-05 DIAGNOSIS — E1140 Type 2 diabetes mellitus with diabetic neuropathy, unspecified: Secondary | ICD-10-CM

## 2017-02-05 DIAGNOSIS — L84 Corns and callosities: Secondary | ICD-10-CM

## 2017-02-05 DIAGNOSIS — Q828 Other specified congenital malformations of skin: Secondary | ICD-10-CM

## 2017-02-05 DIAGNOSIS — Z87898 Personal history of other specified conditions: Secondary | ICD-10-CM

## 2017-02-05 NOTE — Progress Notes (Signed)
Patient presents for diabetic shoe pick up, shoes are tried on for good fit.  Patient received 1 Pair and 3 pairs custom molded diabetic inserts.  Verbal and written break in and wear instructions given.  Patient will follow up for scheduled routine care.   

## 2017-02-05 NOTE — Patient Instructions (Signed)

## 2017-02-14 ENCOUNTER — Encounter (HOSPITAL_COMMUNITY): Payer: Self-pay

## 2017-02-14 ENCOUNTER — Ambulatory Visit (HOSPITAL_COMMUNITY): Payer: Medicare PPO

## 2017-02-15 ENCOUNTER — Telehealth: Payer: Self-pay | Admitting: *Deleted

## 2017-02-15 NOTE — Telephone Encounter (Signed)
Received call from patient.   Reports that BP readings are as follows: 2/23 156/71 2/24 184/78 2/25 168/75 2/26 168/73 2/27 171/70 2/28 164/68 3/1 169/71  Patient reports that she is taking ASA 325mg  PO Q AM, Amlodipine 10mg  PO Q AM, and Metoprolol 50mg  PO Q HS. Reports readings are taken in AM, 2 hours after medications.   MD please advise.

## 2017-02-16 ENCOUNTER — Ambulatory Visit: Payer: Commercial Managed Care - HMO | Admitting: Internal Medicine

## 2017-02-16 NOTE — Telephone Encounter (Signed)
Call placed to patient and patient made aware.   Will call in (2) weeks to F/U BP.

## 2017-02-16 NOTE — Telephone Encounter (Signed)
Recommend she take the clonidine 0.1mg  BID, her BP is still too high

## 2017-03-01 NOTE — Telephone Encounter (Signed)
Call placed to patient. LMTRC.  

## 2017-03-02 ENCOUNTER — Telehealth: Payer: Self-pay | Admitting: *Deleted

## 2017-03-02 MED ORDER — OXYCODONE-ACETAMINOPHEN 5-325 MG PO TABS: 1.0000 | ORAL_TABLET | Freq: Four times a day (QID) | ORAL | 0 refills | Status: DC | PRN

## 2017-03-02 MED ORDER — CLONIDINE HCL 0.2 MG PO TABS: 0.2000 mg | ORAL_TABLET | Freq: Two times a day (BID) | ORAL | 3 refills | Status: DC

## 2017-03-02 NOTE — Telephone Encounter (Signed)
Prescription printed and patient made aware to come to office to pick up on 03/05/2017.

## 2017-03-02 NOTE — Telephone Encounter (Signed)
Increase clonidine to 0.2mg  twice a day  Get her appt to recheck her BP next week

## 2017-03-02 NOTE — Telephone Encounter (Signed)
Call placed to patient and patient made aware.   Prescription sent to pharmacy.   Appointment scheduled.  

## 2017-03-02 NOTE — Telephone Encounter (Signed)
okay

## 2017-03-02 NOTE — Telephone Encounter (Signed)
Call placed to patient.   Reports that recent BP readings are as follows: 3/14 203/83 3/15 183/81 3/16 165/64  Patient reports that she is taking ASA 325mg  PO Q AM, Amlodipine 10mg  PO Q AM, Clonidine .01mg  PO BID, and Metoprolol 50mg  PO Q HS.  MD please advise.

## 2017-03-02 NOTE — Addendum Note (Signed)
Addended by: Sheral Flow on: 03/02/2017 05:08 PM   Modules accepted: Orders

## 2017-03-02 NOTE — Telephone Encounter (Signed)
Received call from patient.    Requested refill on Oxycodone.   Ok to refill??  Last office visit/ refill 02/01/2017.

## 2017-03-05 ENCOUNTER — Ambulatory Visit (INDEPENDENT_AMBULATORY_CARE_PROVIDER_SITE_OTHER): Payer: Medicare PPO | Admitting: Family Medicine

## 2017-03-05 ENCOUNTER — Encounter: Payer: Self-pay | Admitting: Family Medicine

## 2017-03-05 VITALS — BP 152/78 | HR 62 | Temp 98.1°F | Resp 16 | Ht 70.0 in | Wt 183.0 lb

## 2017-03-05 DIAGNOSIS — I10 Essential (primary) hypertension: Secondary | ICD-10-CM

## 2017-03-05 DIAGNOSIS — E7800 Pure hypercholesterolemia, unspecified: Secondary | ICD-10-CM

## 2017-03-05 MED ORDER — CLONIDINE HCL 0.2 MG PO TABS: 0.2000 mg | ORAL_TABLET | Freq: Two times a day (BID) | ORAL | 3 refills | Status: DC

## 2017-03-05 NOTE — Progress Notes (Signed)
   Subjective:    Patient ID: Amanda Krueger, female    DOB: July 03, 1946, 71 y.o.   MRN: [id number]  Patient presents for F/U BP (BP running high) and Pain (increased pain in L knee)   BP- 137/77  Last night, ranges 137-180/ 70-90's   Taking 0.2MG  OF cloniding twice a day nfor past 3 days  Taking metoprolol 50mg  once a day   Norvasc 10mg  once a day    Has CKD- Last Cr- 1.66She missed her appointment with nephrologist this was directly after she got out of the hospital states that she had too many other bills to pay the plan is to reschedule the called her at the end of last week.  Chronic Knee pain- using cane, able to do her housework, but still in physical therapy. Has more pain after Physical therapy has to take her pain medication at least twice a day   She stopped pravastatin cannot tolerate 3 times a week    Review Of Systems:  GEN- denies fatigue, fever, weight loss,weakness, recent illness HEENT- denies eye drainage, change in vision, nasal discharge, CVS- denies chest pain, palpitations RESP- denies SOB, cough, wheeze ABD- denies N/V, change in stools, abd pain GU- denies dysuria, hematuria, dribbling, incontinence MSK- + joint pain, muscle aches, injury Neuro- denies headache, dizziness, syncope, seizure activity       Objective:    BP (!) 152/78   Pulse 62   Temp 98.1 F (36.7 C) (Oral)   Resp 16   Ht 5\' 10"  (1.778 m)   Wt 183 lb (83 kg)   SpO2 98%   BMI 26.26 kg/m  GEN- NAD, alert and oriented x3, walks with cane /knee brace  CVS- RRR, no murmur RESP-CTAB EXT- No edema Pulses- Radial 2+        Assessment & Plan:     I recommended that she make a follow-up with nephrology her renal function diabetes which is uncontrolled mix it very difficult to control blood pressure and other comorbidities  Problem List Items Addressed This Visit    HTN (hypertension) - Primary    Her blood pressure is very difficult to control that we have problems with  compliance swelling the past. I do not one increase her metoprolol as her heart restarting the 60s. She is tolerating the clonidine. Over the weekend her blood pressures did improve. We'll have her continue with the clonidine 0.2 mg twice a day as well as the metoprolol and amlodipine. She is not on ACE inhibitor with HCTZ if she had worsening renal function with these. We'll get her set up to see cardiology. She also is intolerable for statin drug and has had a stroke with multiple other comorbidities I think that she would be a good candidate for injectable cholesterol medication.      Relevant Medications   cloNIDine (CATAPRES) 0.2 MG tablet      Note: This dictation was prepared with Dragon dictation along with smaller phrase technology. Any transcriptional errors that result from this process are unintentional.

## 2017-03-05 NOTE — Patient Instructions (Signed)
Reschedule with kidney doctor- Dr. Hinda Lenis

## 2017-03-05 NOTE — Assessment & Plan Note (Signed)
Her blood pressure is very difficult to control that we have problems with compliance swelling the past. I do not one increase her metoprolol as her heart restarting the 60s. She is tolerating the clonidine. Over the weekend her blood pressures did improve. We'll have her continue with the clonidine 0.2 mg twice a day as well as the metoprolol and amlodipine. She is not on ACE inhibitor with HCTZ if she had worsening renal function with these. We'll get her set up to see cardiology. She also is intolerable for statin drug and has had a stroke with multiple other comorbidities I think that she would be a good candidate for injectable cholesterol medication.

## 2017-03-19 ENCOUNTER — Other Ambulatory Visit: Payer: Self-pay | Admitting: *Deleted

## 2017-03-19 ENCOUNTER — Ambulatory Visit (INDEPENDENT_AMBULATORY_CARE_PROVIDER_SITE_OTHER): Payer: Medicare PPO | Admitting: Cardiology

## 2017-03-19 ENCOUNTER — Encounter: Payer: Self-pay | Admitting: Cardiology

## 2017-03-19 VITALS — BP 163/80 | HR 60 | Ht 70.0 in | Wt 180.6 lb

## 2017-03-19 DIAGNOSIS — N183 Chronic kidney disease, stage 3 unspecified: Secondary | ICD-10-CM

## 2017-03-19 DIAGNOSIS — I10 Essential (primary) hypertension: Secondary | ICD-10-CM | POA: Diagnosis not present

## 2017-03-19 DIAGNOSIS — Z794 Long term (current) use of insulin: Secondary | ICD-10-CM | POA: Diagnosis not present

## 2017-03-19 DIAGNOSIS — E782 Mixed hyperlipidemia: Secondary | ICD-10-CM | POA: Diagnosis not present

## 2017-03-19 DIAGNOSIS — E118 Type 2 diabetes mellitus with unspecified complications: Secondary | ICD-10-CM

## 2017-03-19 MED ORDER — AMLODIPINE BESYLATE 10 MG PO TABS: 10.0000 mg | ORAL_TABLET | Freq: Every day | ORAL | 1 refills | Status: DC

## 2017-03-19 MED ORDER — HYDRALAZINE HCL 25 MG PO TABS: 25.0000 mg | ORAL_TABLET | Freq: Three times a day (TID) | ORAL | 3 refills | Status: DC

## 2017-03-19 MED ORDER — EZETIMIBE 10 MG PO TABS: 10.0000 mg | ORAL_TABLET | Freq: Every day | ORAL | 6 refills | Status: DC

## 2017-03-19 NOTE — Progress Notes (Signed)
   Clinical Summary Amanda Krueger is a 71 y.o.female patient seen today for follow up of the following medical problems.  1. Hyperlipidemia - had been on pravstatin 3 times a week, did not tolerate due to muscle aches. - lipids Jan 2018 TC 180 TG 95 HDL 59 LDL 102   2. HTN - worsening renal function on ACE and thiazide diuretics in the past per pcp notes  3. CKD III - referred to nephrology by pcp  4. History of CVA - secondary prevention with ASA. No ACE-I or statin as described above.    5. DM2 - Jan 2018 HgbA1c 10.5  - followed by pcp   Past Medical History:  Diagnosis Date  . Diabetes mellitus   . DJD (degenerative joint disease)   . Hyperlipidemia   . Hypertension      Allergies  Allergen Reactions  . Statins Other (See Comments)    Joint pain     Current Outpatient Prescriptions  Medication Sig Dispense Refill  . ACCU-CHEK SMARTVIEW test strip 1 each by Other route 3 (three) times daily.     . amLODipine (NORVASC) 10 MG tablet Take 1 tablet (10 mg total) by mouth daily. 30 tablet 1  . aspirin 325 MG tablet Take 1 tablet (325 mg total) by mouth daily. 30 tablet 12  . Blood Glucose Monitoring Suppl (ACCU-CHEK NANO SMARTVIEW) W/DEVICE KIT     . cloNIDine (CATAPRES) 0.2 MG tablet Take 1 tablet (0.2 mg total) by mouth 2 (two) times daily. 180 tablet 3  . diclofenac sodium (VOLTAREN) 1 % GEL Apply dime size  three times a day as needed, to affected areas (Patient taking differently: Apply 2 g topically 4 (four) times daily as needed. Apply dime size  three times a day as needed, to affected areas(Pain)) 1 Tube 3  . fish oil-omega-3 fatty acids 1000 MG capsule Take 1 g by mouth daily.     . insulin glargine (LANTUS) 100 UNIT/ML injection INJECT 18 UNITS SUBCUTANEOUSLY in am 10 mL 11  . metoprolol succinate (TOPROL-XL) 50 MG 24 hr tablet Take 1 tablet (50 mg total) by mouth daily. Take with or immediately following a meal. 90 tablet 3  . NOVOLIN R RELION 100 UNIT/ML  injection INJECT(0.04-0.06 MLS) 4-6 UNITS SUBCUTANEOUSLY THREE TIMES DAILY BEFORE MEAL(S) 10 mL 2  . oxyCODONE-acetaminophen (PERCOCET/ROXICET) 5-325 MG tablet Take 1 tablet by mouth every 6 (six) hours as needed for moderate pain. 30 tablet 0   No current facility-administered medications for this visit.      Past Surgical History:  Procedure Laterality Date  .  rt toe debridement    . CATARACT EXTRACTION, BILATERAL     right eye-2007, left eye-2012  . EXAM UNDER ANESTHESIA WITH MANIPULATION OF KNEE Left 12/26/2013   Procedure: EXAM UNDER ANESTHESIA WITH MANIPULATION OF KNEE;  Surgeon: Stanley E Harrison, MD;  Location: [facility];  Service: Orthopedics;  Laterality: Left;  . KNEE ARTHROSCOPY Left 12/26/2013   Procedure: ARTHROSCOPY KNEE;  Surgeon: Stanley E Harrison, MD;  Location: [facility];  Service: Orthopedics;  Laterality: Left;  . right toe     Great toe  . SYNOVECTOMY Left 12/26/2013   Procedure: EXTENSIVE SYNOVECTOMY LEFT KNEE;  Surgeon: Stanley E Harrison, MD;  Location: [facility];  Service: Orthopedics;  Laterality: Left;  . WOUND DEBRIDEMENT Left 12/26/2013   Procedure: LEFT GREAT TOE CALLOUS DEBRIDEMENT;  Surgeon: Stanley E Harrison, MD;  Location: [facility];  Service: Orthopedics;  Laterality: Left;       Allergies  Allergen Reactions  . Statins Other (See Comments)    Joint pain      Family History  Problem Relation Age of Onset  . Diabetes Mother   . Stroke Father   . Heart disease Father   . Diabetes Sister   . Diabetes Brother   . Diabetes Sister      Social History Ms. Friesen reports that she has never smoked. She has never used smokeless tobacco. Ms. Faux reports that she does not drink alcohol.   Review of Systems CONSTITUTIONAL: No weight loss, fever, chills, weakness or fatigue.  HEENT: Eyes: No visual loss, blurred vision, double vision or yellow sclerae.No hearing loss, sneezing, congestion, runny nose or sore throat.  SKIN: No rash or itching.    CARDIOVASCULAR: no chest pain, no palpitatoins.  RESPIRATORY: No shortness of breath, cough or sputum.  GASTROINTESTINAL: No anorexia, nausea, vomiting or diarrhea. No abdominal pain or blood.  GENITOURINARY: No burning on urination, no polyuria NEUROLOGICAL: No headache, dizziness, syncope, paralysis, ataxia, numbness or tingling in the extremities. No change in bowel or bladder control.  MUSCULOSKELETAL: No muscle, back pain, joint pain or stiffness.  LYMPHATICS: No enlarged nodes. No history of splenectomy.  PSYCHIATRIC: No history of depression or anxiety.  ENDOCRINOLOGIC: No reports of sweating, cold or heat intolerance. No polyuria or polydipsia.  .   Physical Examination Vitals:   03/19/17 1334 03/19/17 1340  BP: (!) 167/76 (!) 163/80  Pulse: 62 60   Vitals:   03/19/17 1334  Weight: 180 lb 9.6 oz (81.9 kg)  Height: 5' 10" (1.778 m)    Gen: resting comfortably, no acute distress HEENT: no scleral icterus, pupils equal round and reactive, no palptable cervical adenopathy,  CV: RRR, no m/r//g, no jvd Resp: Clear to auscultation bilaterally GI: abdomen is soft, non-tender, non-distended, normal bowel sounds, no hepatosplenomegaly MSK: extremities are warm, no edema.  Skin: warm, no rash Neuro:  no focal deficits Psych: appropriate affect     Assessment and Plan  1. Hyerlipidemia - intolerant to statins - we will try zetia 10mg daily  2. HTN - medications limited due to poor renal function - bp goal in setting of DM2 and CKD <130/80 - start hydralazine 25mg tid. She will submit bp log in 1 week  3. CKD III - intensify bp control as described above  4. DM2 - not at goal -  insuling management per pcp - on ASA. No ACE or statin as described above.       Jonathan F. Branch, M.D. 

## 2017-03-19 NOTE — Patient Instructions (Addendum)
Medication Instructions:   Begin Zetia 10mg  daily.  Begin Hydralazine 25mg  three times per day.  Continue all other medications.    Labwork: none  Testing/Procedures: none  Follow-Up: 6 mo    Any Other Special Instructions Will Be Listed Below (If Applicable). Your physician has requested that you regularly monitor and record your blood pressure readings at home x 1 week.  Please take readings approximately 2 hours after medications & return to office for MD review.    If you need a refill on your cardiac medications before your next appointment, please call your pharmacy.

## 2017-04-02 ENCOUNTER — Telehealth: Payer: Self-pay | Admitting: Cardiology

## 2017-04-02 MED ORDER — LABETALOL HCL 200 MG PO TABS: 200.0000 mg | ORAL_TABLET | Freq: Two times a day (BID) | ORAL | 0 refills | Status: DC

## 2017-04-02 NOTE — Telephone Encounter (Signed)
Can d/c zetia and hydralazine. Have her stop Toprol, start labetlol 200mg  bid to start, update [place] on her home bp's later this week   J Shayma Pfefferle MD

## 2017-04-02 NOTE — Telephone Encounter (Signed)
Patient states she was on zetia in 2017 and she is allergic to it. Did not recall this at apt on 4/2. Patient states she started taking the hydralazine and it is making her chest tight and have major cramps in her leg. Patient states she will no longer take this medicine but would like to know if Dr. Harl Bowie recommends anything else.

## 2017-04-02 NOTE — Telephone Encounter (Signed)
Amanda Krueger called stating that she cannot take the new medication that was given to her. She states that it is causing cramps.

## 2017-04-02 NOTE — Telephone Encounter (Signed)
Patient notified and verbalized understanding. Patient will contact us later this week with BP results. New medication sent to [place].

## 2017-04-08 ENCOUNTER — Other Ambulatory Visit: Payer: Self-pay | Admitting: Family Medicine

## 2017-04-13 ENCOUNTER — Ambulatory Visit: Payer: Medicare PPO | Admitting: Podiatry

## 2017-04-13 ENCOUNTER — Telehealth: Payer: Self-pay | Admitting: *Deleted

## 2017-04-13 MED ORDER — OXYCODONE-ACETAMINOPHEN 5-325 MG PO TABS: 1.0000 | ORAL_TABLET | Freq: Four times a day (QID) | ORAL | 0 refills | Status: DC | PRN

## 2017-04-13 NOTE — Telephone Encounter (Signed)
Received call from patient.   Requested refill on Percocet.   Ok to refill??  Last office visit 03/05/2017.  Last refill 03/02/2017.

## 2017-04-13 NOTE — Telephone Encounter (Signed)
okay

## 2017-04-13 NOTE — Telephone Encounter (Signed)
Prescription printed and patient made aware to come to office to pick up after 3pm on 04/13/2017.

## 2017-05-09 ENCOUNTER — Telehealth: Payer: Self-pay | Admitting: Cardiology

## 2017-05-09 MED ORDER — LABETALOL HCL 200 MG PO TABS: 100.0000 mg | ORAL_TABLET | Freq: Two times a day (BID) | ORAL | Status: DC

## 2017-05-09 NOTE — Telephone Encounter (Signed)
No answer

## 2017-05-09 NOTE — Telephone Encounter (Signed)
Not of other great options for her bp. IS she willing to try taking 1/2 tablet (100mg ) of labetlol bid  Zandra Abts MD

## 2017-05-09 NOTE — Telephone Encounter (Signed)
Extreme fatigue & tiredness, no energy to do anything x several weeks.    Was changed to Labetalol on 4/16.  No chest pain, SOB or dizziness.  Did not take today.

## 2017-05-09 NOTE — Telephone Encounter (Signed)
labetalol (NORMODYNE) 200 MG tablet   Patient called stating that she can not take the medication.  She is normally a very active person, but it is making her go back to sleep after couple of hours of taking it

## 2017-05-09 NOTE — Telephone Encounter (Signed)
Patient notified.  Stated that she is willing to try.

## 2017-05-10 ENCOUNTER — Other Ambulatory Visit: Payer: Self-pay | Admitting: Cardiology

## 2017-07-02 ENCOUNTER — Ambulatory Visit: Payer: Medicare PPO | Admitting: Podiatry

## 2017-07-13 ENCOUNTER — Ambulatory Visit (INDEPENDENT_AMBULATORY_CARE_PROVIDER_SITE_OTHER): Payer: Medicare PPO | Admitting: Family Medicine

## 2017-07-13 ENCOUNTER — Encounter: Payer: Self-pay | Admitting: Family Medicine

## 2017-07-13 VITALS — BP 152/88 | HR 68 | Temp 98.2°F | Resp 14 | Ht 70.0 in | Wt 174.0 lb

## 2017-07-13 DIAGNOSIS — Z794 Long term (current) use of insulin: Secondary | ICD-10-CM

## 2017-07-13 DIAGNOSIS — E0822 Diabetes mellitus due to underlying condition with diabetic chronic kidney disease: Secondary | ICD-10-CM | POA: Diagnosis not present

## 2017-07-13 DIAGNOSIS — R195 Other fecal abnormalities: Secondary | ICD-10-CM | POA: Diagnosis not present

## 2017-07-13 DIAGNOSIS — N183 Chronic kidney disease, stage 3 unspecified: Secondary | ICD-10-CM

## 2017-07-13 DIAGNOSIS — I10 Essential (primary) hypertension: Secondary | ICD-10-CM

## 2017-07-13 DIAGNOSIS — R252 Cramp and spasm: Secondary | ICD-10-CM | POA: Diagnosis not present

## 2017-07-13 MED ORDER — TIZANIDINE HCL 4 MG PO TABS: 4.0000 mg | ORAL_TABLET | Freq: Three times a day (TID) | ORAL | 0 refills | Status: DC | PRN

## 2017-07-13 MED ORDER — OXYCODONE-ACETAMINOPHEN 5-325 MG PO TABS: 1.0000 | ORAL_TABLET | Freq: Four times a day (QID) | ORAL | 0 refills | Status: DC | PRN

## 2017-07-13 NOTE — Patient Instructions (Signed)
Take the zanaflex Return the stool cards  F/U 3 months

## 2017-07-13 NOTE — Progress Notes (Signed)
   Subjective:    Patient ID: Amanda Krueger, female    DOB: 02/25/1946, 71 y.o.   MRN: [id number]  Patient presents for Leg Cramps (would like potassium checked) and [employer] (x2 days)   Patient here with cramps in her legs she has history of chronic pain in the setting of her multiple chronic medical problems including uncontrolled diabetes mellitus history of stroke. But for past month or so has been having severe leg cramps.   Seen by cardiology placed on zetia, felt she was having a heart attack so it was discontined, as wellas hydralazine    Toprol was changed to labetalol which she now takes 1/2 tablet twice a day , often misses night dose  She state she is staying hydrated   She's also had dark stools the past couple of days she she has declined colonoscopy in the past. No straining with bowel movements, but does have constipation. NO bright red blood in stools  Following with nephrology , next august 10th  DM- missed appt with endocrine in march, last A1C 10.5% , takes 5 units Novolin before some meals, Lantus 20 units in the morning       Review Of Systems:  GEN- denies fatigue, fever, weight loss,weakness, recent illness HEENT- denies eye drainage, change in vision, nasal discharge, CVS- denies chest pain, palpitations RESP- denies SOB, cough, wheeze ABD- denies N/V, change in stools, abd pain GU- denies dysuria, hematuria, dribbling, incontinence MSK- denies joint pain, muscle aches, injury Neuro- denies headache, dizziness, syncope, seizure activity       Objective:    BP (!) 152/88   Pulse 68   Temp 98.2 F (36.8 C) (Oral)   Resp 14   Ht 5\' 10"  (1.778 m)   Wt 174 lb (78.9 kg)   SpO2 99%   BMI 24.97 kg/m  GEN- NAD, alert and oriented x3 HEENT- PERRL, EOMI, non injected sclera, pink conjunctiva, MMM, oropharynx clear Neck- Supple, no thyromegaly CVS- RRR, no murmur RESP-CTAB ABD-NABS,soft,NT,ND Rectum- external skin tag, FOBT negative,  normal tone EXT- Pedal edema R>l Pulses- Radial, DP- 2+        Assessment & Plan:      Problem List Items Addressed This Visit    HTN (hypertension) - Primary    Elevated but has not taken meds as prescribed, discussed importance of taking twice a day      Relevant Orders   CBC with Differential/Platelet (Completed)   Comprehensive metabolic panel (Completed)   Diabetes mellitus due to underlying condition with stage 3 chronic kidney disease, with long-term current use of insulin (HCC)    Uncontrolled, missed appt with endocrinology Obtain A1C will forward to her endocrinologist, compliance with regimen and consistenty long standing issue      Relevant Orders   Hemoglobin A1c (Completed)    Other Visit Diagnoses    Leg cramps       check potassium level, given zanaflex throught the weekend   Relevant Orders   Comprehensive metabolic panel (Completed)   Magnesium (Completed)   Dark stools       FOBT neg in office, declines colonoscopy, obtain 3 stool cards from home as well   Relevant Orders   Fecal Occult Blood, Guaiac   Fecal Occult Blood, Guaiac   Fecal Occult Blood, Guaiac      Note: This dictation was prepared with Dragon dictation along with smaller phrase technology. Any transcriptional errors that result from this process are unintentional.

## 2017-07-14 LAB — COMPREHENSIVE METABOLIC PANEL
ALBUMIN: 4 g/dL (ref 3.6–5.1)
ALK PHOS: 103 U/L (ref 33–130)
ALT: 10 U/L (ref 6–29)
AST: 14 U/L (ref 10–35)
BUN: 23 mg/dL (ref 7–25)
CALCIUM: 9.1 mg/dL (ref 8.6–10.4)
CHLORIDE: 103 mmol/L (ref 98–110)
CO2: 24 mmol/L (ref 20–31)
Creat: 2.17 mg/dL — ABNORMAL HIGH (ref 0.50–0.99)
Glucose, Bld: 63 mg/dL — ABNORMAL LOW (ref 70–99)
POTASSIUM: 4.9 mmol/L (ref 3.5–5.3)
Sodium: 138 mmol/L (ref 135–146)
TOTAL PROTEIN: 7.2 g/dL (ref 6.1–8.1)
Total Bilirubin: 0.4 mg/dL (ref 0.2–1.2)

## 2017-07-14 LAB — CBC WITH DIFFERENTIAL/PLATELET
BASOS ABS: 52 {cells}/uL (ref 0–200)
Basophils Relative: 1 %
EOS ABS: 208 {cells}/uL (ref 15–500)
Eosinophils Relative: 4 %
HEMATOCRIT: 35.7 % (ref 35.0–45.0)
HEMOGLOBIN: 11.7 g/dL — AB (ref 12.0–15.0)
LYMPHS ABS: 2600 {cells}/uL (ref 850–3900)
Lymphocytes Relative: 50 %
MCH: 28.5 pg (ref 27.0–33.0)
MCHC: 32.8 g/dL (ref 32.0–36.0)
MCV: 87.1 fL (ref 80.0–100.0)
MONO ABS: 364 {cells}/uL (ref 200–950)
MONOS PCT: 7 %
MPV: 10.6 fL (ref 7.5–12.5)
NEUTROS ABS: 1976 {cells}/uL (ref 1500–7800)
Neutrophils Relative %: 38 %
Platelets: 207 10*3/uL (ref 140–400)
RBC: 4.1 MIL/uL (ref 3.80–5.10)
RDW: 15.4 % — ABNORMAL HIGH (ref 11.0–15.0)
WBC: 5.2 10*3/uL (ref 3.8–10.8)

## 2017-07-14 LAB — HEMOGLOBIN A1C
HEMOGLOBIN A1C: 9.7 % — AB (ref <5.7)
MEAN PLASMA GLUCOSE: 232 mg/dL

## 2017-07-14 LAB — MAGNESIUM: Magnesium: 2.3 mg/dL (ref 1.5–2.5)

## 2017-07-15 ENCOUNTER — Encounter: Payer: Self-pay | Admitting: Family Medicine

## 2017-07-15 NOTE — Assessment & Plan Note (Addendum)
Uncontrolled, missed appt with endocrinology Obtain A1C will forward to her endocrinologist, compliance with regimen and consistenty long standing issue

## 2017-07-15 NOTE — Assessment & Plan Note (Signed)
Elevated but has not taken meds as prescribed, discussed importance of taking twice a day

## 2017-07-19 ENCOUNTER — Ambulatory Visit (HOSPITAL_COMMUNITY)
Admission: RE | Admit: 2017-07-19 | Discharge: 2017-07-19 | Disposition: A | Discharge status: Home / Self Care | Payer: Medicare PPO | Source: Ambulatory Visit | Attending: Nephrology | Admitting: Nephrology

## 2017-07-19 DIAGNOSIS — N183 Chronic kidney disease, stage 3 unspecified: Secondary | ICD-10-CM

## 2017-09-17 ENCOUNTER — Encounter: Payer: Self-pay | Admitting: Cardiology

## 2017-09-17 ENCOUNTER — Other Ambulatory Visit: Payer: Self-pay | Admitting: Family Medicine

## 2017-09-17 ENCOUNTER — Ambulatory Visit (INDEPENDENT_AMBULATORY_CARE_PROVIDER_SITE_OTHER): Payer: Medicare PPO | Admitting: Cardiology

## 2017-09-17 VITALS — BP 140/70 | HR 70 | Ht 66.0 in | Wt 180.6 lb

## 2017-09-17 DIAGNOSIS — N183 Chronic kidney disease, stage 3 unspecified: Secondary | ICD-10-CM

## 2017-09-17 DIAGNOSIS — E782 Mixed hyperlipidemia: Secondary | ICD-10-CM | POA: Diagnosis not present

## 2017-09-17 DIAGNOSIS — I10 Essential (primary) hypertension: Secondary | ICD-10-CM

## 2017-09-17 DIAGNOSIS — Z794 Long term (current) use of insulin: Secondary | ICD-10-CM

## 2017-09-17 DIAGNOSIS — E118 Type 2 diabetes mellitus with unspecified complications: Secondary | ICD-10-CM

## 2017-09-17 MED ORDER — LABETALOL HCL 100 MG PO TABS: 100.0000 mg | ORAL_TABLET | Freq: Two times a day (BID) | ORAL | 6 refills | Status: DC

## 2017-09-17 NOTE — Patient Instructions (Signed)

## 2017-09-17 NOTE — Progress Notes (Signed)
   Clinical Summary Amanda Krueger is a 70 y.o.female seen today for follow up of the following medical problems.  1. Hyperlipidemia - had been on pravstatin 3 times a week, did not tolerate due to muscle aches. - lipids Jan 2018 TC 180 TG 95 HDL 59 LDL 102 - allergy to zetia   2. HTN - worsening renal function on ACE and thiazide diuretics in the past per pcp notes - chest pain and leg cramps on hydralazine. Extreme fatigue on labetalol, lowered dose to 100mg bid which is tolerable for her - checks bp 2-3 times a week, typically 130s/60s  3. CKD III - followed by Dr Befakakadu  4. History of CVA - secondary prevention with ASA full dose per neurology. No ACE-I or statin as described above.    5. DM2 - Jan 2018 HgbA1c 10.5  - followed by pcp   Past Medical History:  Diagnosis Date  . Diabetes mellitus   . DJD (degenerative joint disease)   . Hyperlipidemia   . Hypertension      Allergies  Allergen Reactions  . Statins Other (See Comments)    Joint pain     Current Outpatient Prescriptions  Medication Sig Dispense Refill  . ACCU-CHEK SMARTVIEW test strip 1 each by Other route 3 (three) times daily.     . amLODipine (NORVASC) 10 MG tablet Take 1 tablet (10 mg total) by mouth daily. 90 tablet 1  . aspirin 325 MG tablet Take 1 tablet (325 mg total) by mouth daily. 30 tablet 12  . Blood Glucose Monitoring Suppl (ACCU-CHEK NANO SMARTVIEW) W/DEVICE KIT     . cloNIDine (CATAPRES) 0.2 MG tablet Take 1 tablet (0.2 mg total) by mouth 2 (two) times daily. 180 tablet 3  . diclofenac sodium (VOLTAREN) 1 % GEL Apply dime size  three times a day as needed, to affected areas (Patient taking differently: Apply 2 g topically 4 (four) times daily as needed. Apply dime size  three times a day as needed, to affected areas(Pain)) 1 Tube 3  . fish oil-omega-3 fatty acids 1000 MG capsule Take 1 g by mouth daily.     . insulin glargine (LANTUS) 100 UNIT/ML injection INJECT 18 UNITS  SUBCUTANEOUSLY in am 10 mL 11  . labetalol (NORMODYNE) 100 MG tablet Take 1 tablet (100 mg total) by mouth 2 (two) times daily. 60 tablet 1  . LANTUS 100 UNIT/ML injection INJECT 20 UNITS SUBCUTANEOUSLY AT BEDTIME 10 vial 11  . NOVOLIN R RELION 100 UNIT/ML injection INJECT(0.04-0.06 MLS) 4-6 UNITS SUBCUTANEOUSLY THREE TIMES DAILY BEFORE MEAL(S) 10 mL 2  . oxyCODONE-acetaminophen (PERCOCET/ROXICET) 5-325 MG tablet Take 1 tablet by mouth every 6 (six) hours as needed for moderate pain. 30 tablet 0  . tiZANidine (ZANAFLEX) 4 MG tablet Take 1 tablet (4 mg total) by mouth every 8 (eight) hours as needed for muscle spasms. 30 tablet 0   No current facility-administered medications for this visit.      Past Surgical History:  Procedure Laterality Date  .  rt toe debridement    . CATARACT EXTRACTION, BILATERAL     right eye-2007, left eye-2012  . EXAM UNDER ANESTHESIA WITH MANIPULATION OF KNEE Left 12/26/2013   Procedure: EXAM UNDER ANESTHESIA WITH MANIPULATION OF KNEE;  Surgeon: Stanley E Harrison, MD;  Location: [facility];  Service: Orthopedics;  Laterality: Left;  . KNEE ARTHROSCOPY Left 12/26/2013   Procedure: ARTHROSCOPY KNEE;  Surgeon: Stanley E Harrison, MD;  Location: [facility];  Service: Orthopedics;    Laterality: Left;  . right toe     Great toe  . SYNOVECTOMY Left 12/26/2013   Procedure: EXTENSIVE SYNOVECTOMY LEFT KNEE;  Surgeon: Stanley E Harrison, MD;  Location: [facility];  Service: Orthopedics;  Laterality: Left;  . WOUND DEBRIDEMENT Left 12/26/2013   Procedure: LEFT GREAT TOE CALLOUS DEBRIDEMENT;  Surgeon: Stanley E Harrison, MD;  Location: [facility];  Service: Orthopedics;  Laterality: Left;     Allergies  Allergen Reactions  . Statins Other (See Comments)    Joint pain      Family History  Problem Relation Age of Onset  . Diabetes Mother   . Stroke Father   . Heart disease Father   . Diabetes Sister   . Diabetes Brother   . Diabetes Sister      Social History Ms. Mika reports  that she has never smoked. She has never used smokeless tobacco. Ms. Ahner reports that she does not drink alcohol.   Review of Systems CONSTITUTIONAL: No weight loss, fever, chills, weakness or fatigue.  HEENT: Eyes: No visual loss, blurred vision, double vision or yellow sclerae.No hearing loss, sneezing, congestion, runny nose or sore throat.  SKIN: No rash or itching.  CARDIOVASCULAR: per hpi RESPIRATORY: per hpi GASTROINTESTINAL: No anorexia, nausea, vomiting or diarrhea. No abdominal pain or blood.  GENITOURINARY: No burning on urination, no polyuria NEUROLOGICAL: No headache, dizziness, syncope, paralysis, ataxia, numbness or tingling in the extremities. No change in bowel or bladder control.  MUSCULOSKELETAL: No muscle, back pain, joint pain or stiffness.  LYMPHATICS: No enlarged nodes. No history of splenectomy.  PSYCHIATRIC: No history of depression or anxiety.  ENDOCRINOLOGIC: No reports of sweating, cold or heat intolerance. No polyuria or polydipsia.  .   Physical Examination Vitals:   09/17/17 1520  BP: 140/70  Pulse: 70  SpO2: 98%   Vitals:   09/17/17 1520  Weight: 180 lb 9.6 oz (81.9 kg)  Height: 5' 6" (1.676 m)    Gen: resting comfortably, no acute distress HEENT: no scleral icterus, pupils equal round and reactive, no palptable cervical adenopathy,  CV: rrr, no m/rg, no jvd Resp: Clear to auscultation bilaterally GI: abdomen is soft, non-tender, non-distended, normal bowel sounds, no hepatosplenomegaly MSK: extremities are warm, no edema.  Skin: warm, no rash Neuro:  no focal deficits Psych: appropriate affect    Assessment and Plan   1. Hyperlipidemia - intolerant to statins and zetia - she is not interested in pcsk-9 inhibitor. Continue to follow lipids.   2. HTN - medications limited due to poor renal function and medication side effects - bp goal in setting of DM2 and CKD <130/80 - home bp's at goal, current regimen is tolerable to her as  far as side effects. Continue current meds  3. CKD III - follow with renal - goal bp <130/80 in setting of CKD.   4. DM2 - from cardiac standpoint she is on ASA (full dose per neuro). No ACE/ARB due to poor renalfunction, intolerant to statins.         Jonathan F. Branch, M.D. 

## 2017-10-22 ENCOUNTER — Encounter: Payer: Self-pay | Admitting: Family Medicine

## 2017-10-22 ENCOUNTER — Ambulatory Visit: Payer: Medicare PPO | Admitting: Family Medicine

## 2017-10-22 VITALS — BP 148/74 | HR 72 | Temp 98.1°F | Resp 16 | Ht 66.0 in | Wt 181.0 lb

## 2017-10-22 DIAGNOSIS — N184 Chronic kidney disease, stage 4 (severe): Secondary | ICD-10-CM

## 2017-10-22 DIAGNOSIS — E0822 Diabetes mellitus due to underlying condition with diabetic chronic kidney disease: Secondary | ICD-10-CM | POA: Diagnosis not present

## 2017-10-22 DIAGNOSIS — N183 Chronic kidney disease, stage 3 unspecified: Secondary | ICD-10-CM

## 2017-10-22 DIAGNOSIS — E162 Hypoglycemia, unspecified: Secondary | ICD-10-CM

## 2017-10-22 DIAGNOSIS — I10 Essential (primary) hypertension: Secondary | ICD-10-CM | POA: Diagnosis not present

## 2017-10-22 DIAGNOSIS — R350 Frequency of micturition: Secondary | ICD-10-CM | POA: Diagnosis not present

## 2017-10-22 DIAGNOSIS — Z794 Long term (current) use of insulin: Principal | ICD-10-CM

## 2017-10-22 LAB — URINALYSIS, ROUTINE W REFLEX MICROSCOPIC
Bilirubin Urine: NEGATIVE
Glucose, UA: NEGATIVE
Ketones, ur: NEGATIVE
NITRITE: NEGATIVE
Specific Gravity, Urine: 1.025 (ref 1.001–1.03)
pH: 5 (ref 5.0–8.0)

## 2017-10-22 LAB — HEMOGLOBIN A1C, FINGERSTICK: HEMOGLOBIN A1C, FINGERSTICK: 7.7 %{Hb} — AB (ref <6.0)

## 2017-10-22 LAB — CBC
HCT: 32.4 % — ABNORMAL LOW (ref 35.0–45.0)
HEMOGLOBIN: 11.1 g/dL — AB (ref 11.7–15.5)
MCH: 29.8 pg (ref 27.0–33.0)
MCHC: 34.3 g/dL (ref 32.0–36.0)
MCV: 86.9 fL (ref 80.0–100.0)
Platelets: 191 10*3/uL (ref 140–400)
RBC: 3.73 10*6/uL — AB (ref 3.80–5.10)
RDW: 14.6 % (ref 11.0–15.0)
WBC: 5.7 10*3/uL (ref 3.8–10.8)

## 2017-10-22 LAB — MICROSCOPIC MESSAGE

## 2017-10-22 LAB — GLUCOSE 16585
GLUCOSE 2500: 116 mg/dL — AB (ref 65–99)
GLUCOSE 2500: 61 mg/dL — AB (ref 65–99)

## 2017-10-22 MED ORDER — GLUCOSE 40 % PO GEL
1.0000 | Freq: Once | ORAL | Status: AC
  Administered 2017-10-22: 37.5 g via ORAL

## 2017-10-22 MED ORDER — OXYCODONE-ACETAMINOPHEN 5-325 MG PO TABS: 1.0000 | ORAL_TABLET | Freq: Four times a day (QID) | ORAL | 0 refills | Status: DC | PRN

## 2017-10-22 MED ORDER — INSULIN GLARGINE 100 UNIT/ML ~~LOC~~ SOLN: 20.0000 [IU] | SUBCUTANEOUS | 2 refills | Status: DC

## 2017-10-22 MED ORDER — CIPROFLOXACIN HCL 250 MG PO TABS: 250.0000 mg | ORAL_TABLET | Freq: Two times a day (BID) | ORAL | 0 refills | Status: DC

## 2017-10-22 MED ORDER — INSULIN REGULAR HUMAN 100 UNIT/ML IJ SOLN: INTRAMUSCULAR | 3 refills | Status: AC

## 2017-10-22 NOTE — Assessment & Plan Note (Addendum)
CBC in office unremarkable Pt ate about 2 hours ago, yet Glucose was 63 in office Given Glucagon repeat glucose in office  Her fingerstick A1c returned at 7.7% which is significantly lower from before down 2 points but I actually think this is may be in the setting of her worsening kidney disease.  As we cannot get a urine sample to look for possible urinary tract infection I am going to go ahead and give her 3 days of antibiotics to cover for this.  Her metabolic panel is pending.  When I reduce her Lantus to 20 units and reduce her NovoLog to 5 units 3 times a day

## 2017-10-22 NOTE — Progress Notes (Signed)
Subjective:    Patient ID: Amanda Krueger, female    DOB: February 20, 1946, 71 y.o.   MRN: [id number]  Patient presents for Cold Chills (states that he BS dropped 2-3x, she woke up sweating and then she noted chills when she went back to bed- also states that hands feel clammy constantly and she is always cold- no other Sx)   Pt here with  ? Hypoglycemia episodes She has not followed up with endocrinology . Last A1C 9.7% in July 2018 Recent episodes blood sugar dropped, felt sweaty, and with chills, did not check her CBG to see if it was low, just felt it was. Since the episode she is very cold, using multiple layers and electric blanket  Denies any recent illness. Denies dysuria but has urinary frequency.     Currently taking Lantus- 22 units at bedtime and Novlog 8 units three times a day    She is seeing cardiology for her blood pressure  But states she does not feel well on the BP medications,her labetalol was decreased to 100mg  BID   Nephrology for CKD- Dr. Hinda Lenis, has been rescheduled as she cancelled the last appointment.   She is taking CBD oil for pain, request refill on percocet , last refilled in July, does not take regulary   Review Of Systems:  GEN- + fatigue, denies fever, weight loss,weakness, recent illness HEENT- denies eye drainage, change in vision, nasal discharge, CVS- denies chest pain, palpitations RESP- denies SOB, cough, wheeze ABD- denies N/V, change in stools, abd pain GU- denies dysuria, hematuria, dribbling, incontinence MSK- denies joint pain, muscle aches, injury Neuro- denies headache, dizziness, syncope, seizure activity       Objective:    BP (!) 148/74   Pulse 72   Temp 98.1 F (36.7 C) (Oral)   Resp 16   Ht 5\' 6"  (1.676 m)   Wt 181 lb (82.1 kg)   SpO2 97%   BMI 29.21 kg/m  GEN- NAD, alert and oriented x3,walking with cane  HEENT- PERRL, EOMI, non injected sclera, pink conjunctiva, MMM, oropharynx clear Neck- Supple, no thyromegaly,  no LAD  CVS- RRR, no murmur RESP-CTAB ABD-NABS,soft,NT,ND EXT- No edema Pulses- Radial, DP-diminished bilat        Glucagon x 2 given, sweet tea, she was not symptomatic in the office with CBG down to 63, 2nd given due to repeat CBG 61, CBG at 116 at discharge from clinic  Assessment & Plan:      Problem List Items Addressed This Visit      Unprioritized   Uncontrolled secondary diabetes mellitus with stage 4 CKD (GFR 15-29) (HCC) - Primary    CBC in office unremarkable Pt ate about 2 hours ago, yet Glucose was 63 in office Given Glucagon repeat glucose in office  Her fingerstick A1c returned at 7.7% which is significantly lower from before down 2 points but I actually think this is may be in the setting of her worsening kidney disease.  As we cannot get a urine sample to look for possible urinary tract infection I am going to go ahead and give her 3 days of antibiotics to cover for this.  Her metabolic panel is pending.  When I reduce her Lantus to 20 units and reduce her NovoLog to 5 units 3 times a day      Relevant Medications   insulin glargine (LANTUS) 100 UNIT/ML injection   insulin regular (NOVOLIN R RELION) 100 units/mL injection   dextrose (GLUTOSE) 40 % oral  gel 37.5 g (Completed)   HTN (hypertension)    BP looks okay, recommend she take BP meds as prescribed by cardiology Due to her CKD she is on most appropriate regimen and reiterated this to her      Chronic kidney disease (CKD), stage IV (severe) ([place])   Relevant Orders   Comprehensive metabolic panel    Other Visit Diagnoses    Hypoglycemia       Relevant Medications   dextrose (GLUTOSE) 40 % oral gel 37.5 g (Completed)   Other Relevant Orders   Glucose, fingerstick (stat)   GLUCOSE 83291 (Completed)   Urinary frequency       very small amount of urine left, treat Cipro x 3 days    Relevant Orders   Urinalysis, Routine w reflex microscopic (Completed)      Note: This dictation was prepared with  Dragon dictation along with smaller phrase technology. Any transcriptional errors that result from this process are unintentional.

## 2017-10-22 NOTE — Assessment & Plan Note (Signed)
BP looks okay, recommend she take BP meds as prescribed by cardiology Due to her CKD she is on most appropriate regimen and reiterated this to her

## 2017-10-22 NOTE — Patient Instructions (Addendum)
Reduce Lantus to  20 units Reduce Novlog to 5 units three times a day  Take the antibiotics for 3 days Continue all other medications F/U 3 months

## 2017-10-23 ENCOUNTER — Other Ambulatory Visit: Payer: Self-pay | Admitting: Family Medicine

## 2017-10-23 DIAGNOSIS — Z1231 Encounter for screening mammogram for malignant neoplasm of breast: Secondary | ICD-10-CM

## 2017-10-23 LAB — COMPREHENSIVE METABOLIC PANEL
AG Ratio: 1.1 (calc) (ref 1.0–2.5)
ALBUMIN MSPROF: 3.9 g/dL (ref 3.6–5.1)
ALT: 14 U/L (ref 6–29)
AST: 18 U/L (ref 10–35)
Alkaline phosphatase (APISO): 78 U/L (ref 33–130)
BUN / CREAT RATIO: 14 (calc) (ref 6–22)
BUN: 39 mg/dL — ABNORMAL HIGH (ref 7–25)
CO2: 28 mmol/L (ref 20–32)
CREATININE: 2.83 mg/dL — AB (ref 0.60–0.93)
Calcium: 9 mg/dL (ref 8.6–10.4)
Chloride: 105 mmol/L (ref 98–110)
GLOBULIN: 3.4 g/dL (ref 1.9–3.7)
GLUCOSE: 70 mg/dL (ref 65–99)
Potassium: 5.2 mmol/L (ref 3.5–5.3)
Sodium: 138 mmol/L (ref 135–146)
TOTAL PROTEIN: 7.3 g/dL (ref 6.1–8.1)
Total Bilirubin: 0.4 mg/dL (ref 0.2–1.2)

## 2017-10-23 LAB — GLUCOSE 16585: GLUCOSE 2500: 63 mg/dL — AB (ref 65–99)

## 2017-11-01 ENCOUNTER — Ambulatory Visit (HOSPITAL_COMMUNITY): Payer: Medicare PPO

## 2017-11-19 ENCOUNTER — Other Ambulatory Visit: Payer: Self-pay | Admitting: Family Medicine

## 2018-01-04 ENCOUNTER — Ambulatory Visit: Payer: Medicare PPO | Admitting: Podiatry

## 2018-01-04 ENCOUNTER — Ambulatory Visit (INDEPENDENT_AMBULATORY_CARE_PROVIDER_SITE_OTHER): Payer: Medicare PPO

## 2018-01-04 ENCOUNTER — Encounter: Payer: Self-pay | Admitting: Podiatry

## 2018-01-04 VITALS — BP 188/89 | HR 84 | Temp 98.8°F | Resp 18

## 2018-01-04 DIAGNOSIS — L02612 Cutaneous abscess of left foot: Secondary | ICD-10-CM

## 2018-01-04 DIAGNOSIS — L97521 Non-pressure chronic ulcer of other part of left foot limited to breakdown of skin: Secondary | ICD-10-CM

## 2018-01-04 DIAGNOSIS — M79676 Pain in unspecified toe(s): Secondary | ICD-10-CM | POA: Diagnosis not present

## 2018-01-04 DIAGNOSIS — B351 Tinea unguium: Secondary | ICD-10-CM

## 2018-01-04 DIAGNOSIS — L03032 Cellulitis of left toe: Secondary | ICD-10-CM

## 2018-01-04 DIAGNOSIS — I739 Peripheral vascular disease, unspecified: Secondary | ICD-10-CM | POA: Diagnosis not present

## 2018-01-04 DIAGNOSIS — E1149 Type 2 diabetes mellitus with other diabetic neurological complication: Secondary | ICD-10-CM

## 2018-01-04 MED ORDER — MUPIROCIN 2 % EX OINT: 1.0000 "application " | TOPICAL_OINTMENT | Freq: Two times a day (BID) | CUTANEOUS | 2 refills | Status: DC

## 2018-01-04 MED ORDER — AMOXICILLIN-POT CLAVULANATE 875-125 MG PO TABS: 1.0000 | ORAL_TABLET | Freq: Two times a day (BID) | ORAL | 0 refills | Status: DC

## 2018-01-07 NOTE — Progress Notes (Signed)
Subjective: Amanda Krueger presents the office today for concerns of a thick callus, possible wound to the left big toe which is been ongoing for the last couple weeks.  She feels that there is an infection in her left big toe.  She denies any drainage or pus and she denies any surrounding redness or red streaks but she has noticed swelling.  She also states her nails in general are thick and discolored she cannot trim them herself.  She denies any surrounding redness or drainage from the toenail sites. Denies any systemic complaints such as fevers, chills, nausea, vomiting. No acute changes since last appointment, and no other complaints at this time.   Objective: AAO x3, NAD DP/PT pulses palpable bilaterally, CRT less than 3 seconds Sensation decreased with Simms Weinstein monofilament Thick hyperkeratotic lesios to the distal aspect the left hallux and there is epidermal lysis around the callus extending into the toenail.  After debridement of the hyperkeratotic tissue there was underlying superficial granular wound to the distal aspect of the toe as well as the dorsal aspect.  Also the epidermal lysis did encompass the toenail and the nail was loose.  I was able to debride the loose portion of the nail without any complications or bleeding.  There is edema to the hallux.  There is no drainage or pus expressed.  No significant erythema there is no ascending cellulitis. Nails are hypertrophic, dystrophic, discolored x9.  There is tenderness nails 2 through 5 on the left and 1 through 5 on the right.  No swelling redness or drainage. No open lesions or pre-ulcerative lesions.  No pain with calf compression, swelling, warmth, erythema              Assessment: Ulceration left hallux; some dramatic onychomycosis  Plan: -All treatment options discussed with the patient including all alternatives, risks, complications.  -Nails are sharply debrided x9 without any complications or bleeding -X-rays  were obtained and reviewed.  There is questionable areas of cortical irregularity to the distal phalanx of the hallux concerning for osteomyelitis.  There is no soft tissue emphysema present. Significant arthritic changes present.  -Debrided the hyperkeratotic tissue in the wound to the left hallux without any complications to healthy, granular tissue.  Also was able to debride a portion of the left hallux toenail. -We will start oral Augmentin now as prescribed mupirocin ointment to apply topically. -I performed an ABI in the office on the pilot study which was normal.  The left is 1.06 and the right was 1.05. -Short Cam boot was dispensed for offloading -Monitor for any clinical signs or symptoms of infection and directed to call the office immediately should any occur or go to the ER. -Follow-up in 1 week or sooner if needed. -Patient encouraged to call the office with any questions, concerns, change in symptoms.   Trula Slade DPM

## 2018-01-14 ENCOUNTER — Ambulatory Visit: Payer: Medicare PPO | Admitting: Podiatry

## 2018-01-14 ENCOUNTER — Encounter: Payer: Self-pay | Admitting: Podiatry

## 2018-01-14 VITALS — BP 159/68 | HR 69

## 2018-01-14 DIAGNOSIS — L03032 Cellulitis of left toe: Secondary | ICD-10-CM | POA: Diagnosis not present

## 2018-01-14 DIAGNOSIS — L97521 Non-pressure chronic ulcer of other part of left foot limited to breakdown of skin: Secondary | ICD-10-CM

## 2018-01-14 DIAGNOSIS — L02612 Cutaneous abscess of left foot: Secondary | ICD-10-CM | POA: Diagnosis not present

## 2018-01-14 MED ORDER — AMOXICILLIN-POT CLAVULANATE 875-125 MG PO TABS: 1.0000 | ORAL_TABLET | Freq: Two times a day (BID) | ORAL | 0 refills | Status: DC

## 2018-01-16 NOTE — Progress Notes (Signed)
Subjective: Amanda Krueger presents the office today for follow-up evaluation of a wound to the left big toe.  She states the toe is doing better and she has not noticed any drainage or any bleeding.  She denies any red streaks or any increase in swelling or redness.  She denies any systemic complaints such as fevers, chills, nausea, vomiting.  She is remained on Augmentin.  She is remained in the cam boot.  She denies any changes since last appointment.  Objective: AAO x3, NAD DP/PT pulses palpable bilaterally, CRT less than 3 seconds To the distal hallux continues to be a granular ulceration measuring approximately 1.5 x 1.3 cm.  There is no probing, undermining or tunneling.  Hyperkeratotic periwound.  There is no drainage or pus.  There is no fluctuation or crepitation.  No ascending cellulitis.  No other open lesions or pre-ulcerative lesions were identified today. No open lesions or pre-ulcerative lesions.  No pain with calf compression, swelling, warmth, erythema      Assessment: Ulceration left hallux  Plan: -All treatment options discussed with the patient including all alternatives, risks, complications.  -I sharply debrided the wound today to healthy, granular tissue and to remove the surrounding hyperkeratotic tissue after verbal consent was obtained the area was cleaned with alcohol.  I want her to continue the small amount of antibiotic ointment dressing changes daily.  Continue antibiotics and I refilled Augmentin today.  Remain in the cam boot.  Follow-up as scheduled or sooner if needed.  Watch for any signs or symptoms of worsening infection go to the ER should any occur. -Patient encouraged to call the office with any questions, concerns, change in symptoms.   Trula Slade DPM

## 2018-01-22 ENCOUNTER — Ambulatory Visit: Payer: Medicare PPO | Admitting: Family Medicine

## 2018-01-25 ENCOUNTER — Ambulatory Visit (INDEPENDENT_AMBULATORY_CARE_PROVIDER_SITE_OTHER): Payer: Medicare PPO

## 2018-01-25 ENCOUNTER — Ambulatory Visit: Payer: Medicare PPO | Admitting: Podiatry

## 2018-01-25 DIAGNOSIS — L97521 Non-pressure chronic ulcer of other part of left foot limited to breakdown of skin: Secondary | ICD-10-CM

## 2018-01-25 DIAGNOSIS — M2022 Hallux rigidus, left foot: Secondary | ICD-10-CM

## 2018-01-30 NOTE — Progress Notes (Signed)
Subjective: Thy presents the office today for follow-up evaluation of a wound to the left big toe.  She states that she is doing well she has not noticed any drainage or pus coming from the wound she denies any swelling or redness to her feet.  Took antibiotics as directed.  She has no other concerns today and she has been wearing her cam boot.  She denies any systemic complaints of fevers, chills, nausea, vomiting.  No calf pain, chest pain, shortness of breath.  Objective: AAO x3, NAD DP/PT pulses palpable bilaterally, CRT less than 3 seconds To the distal hallux continues to be a granular ulceration measuring approximately 1 x 0.8 with mild hypergranulation tissue.  There is no probing, undermining or tunneling.  Hyperkeratotic periwound.  There is no drainage or pus.  There is no fluctuation or crepitation.  No ascending cellulitis.  No other open lesions or pre-ulcerative lesions were identified today. Decreased ROM of the 1st MTPJ.  And prominence of the first metatarsal phalangeal joint consistent with a bony exostosis. No open lesions or pre-ulcerative lesions.  No pain with calf compression, swelling, warmth, erythema        Assessment: Ulceration left hallux  Plan: -All treatment options discussed with the patient including all alternatives, risks, complications.  -X-rays were obtained and reviewed.  No definitive evidence of cortical destruction suggestive of osteomyelitis today. -Today I sharply debrided the wound after verbal consent was obtained and the wound was cleaned with alcohol.  The wound was debrided to healthy, granular tissue.  Silver nitrate was applied followed by Silvadene and a dry sterile dressing.  She tolerated well without any complications.  I want her to continue with antibiotic ointment dressing changes daily.  Continue cam boot, elevation. -We discussed possible surgical intervention for possible Keller bunionectomy should the wound not heal. -Monitor  for any clinical signs or symptoms of infection and directed to call the office immediately should any occur or go to the ER. -Follow-up as scheduled or sooner if needed.  Call any questions or concerns meantime.  Trula Slade DPM

## 2018-02-11 ENCOUNTER — Ambulatory Visit: Payer: Medicare PPO | Admitting: Podiatry

## 2018-02-11 ENCOUNTER — Encounter: Payer: Self-pay | Admitting: Podiatry

## 2018-02-11 DIAGNOSIS — E1149 Type 2 diabetes mellitus with other diabetic neurological complication: Secondary | ICD-10-CM | POA: Diagnosis not present

## 2018-02-11 DIAGNOSIS — L97521 Non-pressure chronic ulcer of other part of left foot limited to breakdown of skin: Secondary | ICD-10-CM

## 2018-02-11 DIAGNOSIS — Q828 Other specified congenital malformations of skin: Secondary | ICD-10-CM

## 2018-02-11 NOTE — Progress Notes (Signed)
Subjective: Amanda Krueger presents the office today for follow-up evaluation of a wound to the left big toe.  She states the toe is doing much better.  She denies any drainage or pus.  She does keep a small amount of antibiotic ointment to the area daily.  She denies any increase in swelling or redness.  She is remained in the surgical shoe.  She feels that the wound is healed.  She also has calluses to her left foot.  She has no other concerns today.  No other open lesions that she has noticed. She denies any systemic complaints of fevers, chills, nausea, vomiting.  No calf pain, chest pain, shortness of breath.  Objective: AAO x3, NAD DP/PT pulses palpable bilaterally, CRT less than 3 seconds To the distal hallux continues to be a granular ulceration measuring approximately 0.5 x 0.3 cm and there is significant hyperkeratotic tissue along the wound periphery.  This measurements after debridement.  There is no probing to bone, undermining or tunneling.  There is no surrounding erythema, ascending cellulitis.  There is no fluctuation or crepitation.  There is no malodor.  Hallux rigidus is present.   Hyperkeratotic lesions left foot submetatarsal 1 and 5.  Upon debridement there is no underlying ulceration, drainage or any clinical signs of infection noted today.   No other  open lesions or pre-ulcerative lesions.  No pain with calf compression, swelling, warmth, erythema      Assessment: Ulceration left hallux; hyperkeratotic lesions  Plan: -All treatment options discussed with the patient including all alternatives, risks, complications.  -He was cleaned today with alcohol and utilizing #312 blade scalpel was sharply debrided to healthy, granular tissue.  Continue the antibiotic limit dressing changes daily.  We will hold off any further oral antibiotics at this time as there is no clinical signs of infection noted.  Continue surgical shoe and offloading at all times. Monitor for any clinical signs or  symptoms of infection and directed to call the office immediately should any occur or go to the ER. -Hyperkeratotic lesion Sharpy debrided x2 without any complications or bleeding  Trula Slade DPM

## 2018-02-12 ENCOUNTER — Other Ambulatory Visit: Payer: Self-pay | Admitting: Cardiology

## 2018-02-12 MED ORDER — LABETALOL HCL 100 MG PO TABS: 100.0000 mg | ORAL_TABLET | Freq: Two times a day (BID) | ORAL | 1 refills | Status: DC

## 2018-02-12 NOTE — Telephone Encounter (Signed)
Medication sent to pharmacy  

## 2018-02-12 NOTE — Telephone Encounter (Signed)
°*  STAT* If patient is at the pharmacy, call can be transferred to refill team.   1. Which medications need to be refilled? labetalol (NORMODYNE) 100 MG tablet    2. Which pharmacy/location [facility] in [place]  3. Do they need a 30 day or 90 day supply?

## 2018-02-25 ENCOUNTER — Encounter: Payer: Self-pay | Admitting: Podiatry

## 2018-02-25 ENCOUNTER — Ambulatory Visit: Payer: Medicare PPO | Admitting: Podiatry

## 2018-02-25 DIAGNOSIS — E1149 Type 2 diabetes mellitus with other diabetic neurological complication: Secondary | ICD-10-CM

## 2018-02-25 DIAGNOSIS — L97521 Non-pressure chronic ulcer of other part of left foot limited to breakdown of skin: Secondary | ICD-10-CM | POA: Diagnosis not present

## 2018-02-25 NOTE — Progress Notes (Signed)
Subjective: Amanda Krueger presents the office today for follow-up evaluation of a wound to the left big toe.  She has been keeping a small amount of antibiotic ointment on the area daily.  She denies any drainage or pus overall she feels that the wound is looking much better to her.  She is remaining surgical shoe.  She has no recent injury that she reports and she has no new concerns. She denies any systemic complaints of fevers, chills, nausea, vomiting.  No calf pain, chest pain, shortness of breath.  Objective: AAO x3, NAD DP/PT pulses palpable bilaterally, CRT less than 3 seconds To the distal hallux continues to be a granular ulceration measuring approximately 0.3 x 0.3 cm and is superficial almost appears to be an abrasion type lesion.  There is no probing, undermining or tunneling.  There is no surrounding erythema, ascending sialitis.  There is no fluctuation or crepitation.  There is no malodor.  Hyperkeratotic periwound.  Hallux malleus is present as well as hallux rigidus.  No other  open lesions or pre-ulcerative lesions.  No pain with calf compression, swelling, warmth, erythema     Assessment: Ulceration left hallux; hyperkeratotic lesions  Plan: -All treatment options discussed with the patient including all alternatives, risks, complications.  -The wound was cleaned today with alcohol and I sharply debrided the wound to healthy, granular tissue utilizing a #312 blade scalpel.  She tolerated well without any complications.  The wound was cleaned followed by Silvadene and a dry sterile dressing.  I want her to continue with daily dressing changes with antibiotic ointment and a bandage.  Continue offloading at all times. -Monitor for any clinical signs or symptoms of infection and directed to call the office immediately should any occur or go to the ER. -RTC 2 weeks or sooner if needed.  Trula Slade DPM

## 2018-02-25 NOTE — Patient Instructions (Signed)
Continue with a small amount of antibiotic ointment and a bandage to the left big toe daily Monitor for any signs/symptoms of infection. Call the office immediately if any occur or go directly to the emergency room. Call with any questions/concerns.

## 2018-03-14 ENCOUNTER — Ambulatory Visit: Payer: Medicare PPO | Admitting: Podiatry

## 2018-03-15 ENCOUNTER — Telehealth: Payer: Self-pay | Admitting: Family Medicine

## 2018-03-15 MED ORDER — CLONIDINE HCL 0.2 MG PO TABS: 0.2000 mg | ORAL_TABLET | Freq: Two times a day (BID) | ORAL | 3 refills | Status: DC

## 2018-03-15 MED ORDER — AMLODIPINE BESYLATE 10 MG PO TABS: 10.0000 mg | ORAL_TABLET | Freq: Every day | ORAL | 1 refills | Status: DC

## 2018-03-15 NOTE — Telephone Encounter (Signed)
Med refill on amlodipine and clonidine to [facility] order.

## 2018-03-15 NOTE — Telephone Encounter (Signed)
Refill appropriate and filled per protocol. 

## 2018-03-19 ENCOUNTER — Ambulatory Visit: Payer: Medicare PPO | Admitting: Family Medicine

## 2018-03-27 ENCOUNTER — Ambulatory Visit: Payer: Medicare PPO | Admitting: Cardiology

## 2018-03-27 ENCOUNTER — Encounter: Payer: Self-pay | Admitting: *Deleted

## 2018-03-27 ENCOUNTER — Encounter: Payer: Self-pay | Admitting: Cardiology

## 2018-03-27 VITALS — BP 138/62 | HR 67 | Ht 70.0 in | Wt 179.0 lb

## 2018-03-27 DIAGNOSIS — N183 Chronic kidney disease, stage 3 unspecified: Secondary | ICD-10-CM

## 2018-03-27 DIAGNOSIS — E782 Mixed hyperlipidemia: Secondary | ICD-10-CM

## 2018-03-27 DIAGNOSIS — I10 Essential (primary) hypertension: Secondary | ICD-10-CM | POA: Diagnosis not present

## 2018-03-27 MED ORDER — LABETALOL HCL 100 MG PO TABS: ORAL_TABLET | ORAL | 1 refills | Status: DC

## 2018-03-27 NOTE — Patient Instructions (Signed)
Your physician wants you to follow-up in: [place] will receive a reminder letter in the mail two months in advance. If you don't receive a letter, please call our office to schedule the follow-up appointment.  Your physician has recommended you make the following change in your medication:   TAKE LABETALOL 100 MG TWICE DAILY MAY TAKE ADDITIONAL 100 MG FOR TOP NUMBER BLOOD PRESSURE GREATER THAN 160   Thank you for choosing [facility]!!

## 2018-03-27 NOTE — Progress Notes (Signed)
Clinical Summary Amanda Krueger is a 72 y.o.female seen today for follow up of the following medical problems.  1. Hyperlipidemia - had been on pravstatin 3 times a week, did not tolerate due to muscle aches. - lipids Jan 2018 TC 180 TG 95 HDL 59 LDL 102 - allergy to zetia  - working on dietary modification. LDL has not been bad enough to consider pcsk9 inhibitor.    2. HTN - worsening renal function on ACE and thiazide diuretics in the past per pcp notes - chest pain and leg cramps on hydralazine. Extreme fatigue on labetalol, lowered dose to 170m bid which is tolerable for her   - checks bp at home, usually 140s/60s-70s - compliant with meds   3. CKD III - followed by Dr BErmalinda Barrios  4. History of CVA - secondary prevention with ASA full dose per neurology. No ACE-I or statin as described above.      Past Medical History:  Diagnosis Date  . Diabetes mellitus   . Diabetes mellitus due to underlying condition with stage 3 chronic kidney disease, with long-term current use of insulin ([place] 08/15/2016  . DJD (degenerative joint disease)   . Hyperlipidemia   . Hypertension      Allergies  Allergen Reactions  . Statins Other (See Comments)    Joint pain     Current Outpatient Medications  Medication Sig Dispense Refill  . ACCU-CHEK SMARTVIEW test strip 1 each by Other route 3 (three) times daily.     .Marland KitchenamLODipine (NORVASC) 10 MG tablet Take 1 tablet (10 mg total) by mouth daily. 90 tablet 1  . aspirin 325 MG tablet Take 1 tablet (325 mg total) by mouth daily. 30 tablet 12  . Blood Glucose Monitoring Suppl (ACCU-CHEK NANO SMARTVIEW) W/DEVICE KIT     . Cholecalciferol (VITAMIN D) 2000 units CAPS Take 1 capsule by mouth daily.    . ciprofloxacin (CIPRO) 250 MG tablet Take 1 tablet (250 mg total) 2 (two) times daily by mouth. 6 tablet 0  . cloNIDine (CATAPRES) 0.2 MG tablet Take 1 tablet (0.2 mg total) by mouth 2 (two) times daily. 180 tablet 3  . fish  oil-omega-3 fatty acids 1000 MG capsule Take 1 g by mouth daily.     . insulin glargine (LANTUS) 100 UNIT/ML injection Inject 0.2 mLs (20 Units total) every morning into the skin. 10 mL 2  . insulin regular (NOVOLIN R RELION) 100 units/mL injection INJECT(0.04-0.06 MLS) 4-6 UNITS SUBCUTANEOUSLY THREE TIMES DAILY BEFORE MEAL(S) 10 mL 3  . labetalol (NORMODYNE) 100 MG tablet Take 1 tablet (100 mg total) by mouth 2 (two) times daily. 180 tablet 1  . MAGNESIUM OXIDE PO Take 1 tablet by mouth daily.    .Marland KitchenMISC NATURAL PRODUCTS PO Take by mouth. CBD oil as needed for pain - 5 drops under tongue daily    . mupirocin ointment (BACTROBAN) 2 % Apply 1 application topically 2 (two) times daily. 30 g 2  . oxyCODONE-acetaminophen (PERCOCET/ROXICET) 5-325 MG tablet Take 1 tablet every 6 (six) hours as needed by mouth for moderate pain. 30 tablet 0   No current facility-administered medications for this visit.      Past Surgical History:  Procedure Laterality Date  .  rt toe debridement    . CATARACT EXTRACTION, BILATERAL     right eye-2007, left eye-2012  . EXAM UNDER ANESTHESIA WITH MANIPULATION OF KNEE Left 12/26/2013   Procedure: EXAM UNDER ANESTHESIA WITH MANIPULATION OF KNEE;  Surgeon: Carole Civil, MD;  Location: [facility];  Service: Orthopedics;  Laterality: Left;  . KNEE ARTHROSCOPY Left 12/26/2013   Procedure: ARTHROSCOPY KNEE;  Surgeon: Carole Civil, MD;  Location: [facility];  Service: Orthopedics;  Laterality: Left;  . right toe     Great toe  . SYNOVECTOMY Left 12/26/2013   Procedure: EXTENSIVE SYNOVECTOMY LEFT KNEE;  Surgeon: Carole Civil, MD;  Location: [facility];  Service: Orthopedics;  Laterality: Left;  . WOUND DEBRIDEMENT Left 12/26/2013   Procedure: LEFT GREAT TOE CALLOUS DEBRIDEMENT;  Surgeon: Carole Civil, MD;  Location: [facility];  Service: Orthopedics;  Laterality: Left;     Allergies  Allergen Reactions  . Statins Other (See Comments)    Joint pain      Family  History  Problem Relation Age of Onset  . Diabetes Mother   . Stroke Father   . Heart disease Father   . Diabetes Sister   . Diabetes Brother   . Diabetes Sister      Social History Ms. Schnider reports that she has never smoked. She has never used smokeless tobacco. Ms. Sebring reports that she does not drink alcohol.   Review of Systems CONSTITUTIONAL: No weight loss, fever, chills, weakness or fatigue.  HEENT: Eyes: No visual loss, blurred vision, double vision or yellow sclerae.No hearing loss, sneezing, congestion, runny nose or sore throat.  SKIN: No rash or itching.  CARDIOVASCULAR: per hpi RESPIRATORY: No shortness of breath, cough or sputum.  GASTROINTESTINAL: No anorexia, nausea, vomiting or diarrhea. No abdominal pain or blood.  GENITOURINARY: No burning on urination, no polyuria NEUROLOGICAL: No headache, dizziness, syncope, paralysis, ataxia, numbness or tingling in the extremities. No change in bowel or bladder control.  MUSCULOSKELETAL: No muscle, back pain, joint pain or stiffness.  LYMPHATICS: No enlarged nodes. No history of splenectomy.  PSYCHIATRIC: No history of depression or anxiety.  ENDOCRINOLOGIC: No reports of sweating, cold or heat intolerance. No polyuria or polydipsia.  Marland Kitchen   Physical Examination Vitals:   03/27/18 1032  BP: 138/62  Pulse: 67  SpO2: 98%   Vitals:   03/27/18 1032  Weight: 179 lb (81.2 kg)  Height: '5\' 10"'  (1.778 m)    Gen: resting comfortably, no acute distress HEENT: no scleral icterus, pupils equal round and reactive, no palptable cervical adenopathy,  CV: RRR, no m/r/g, no jvd Resp: Clear to auscultation bilaterally GI: abdomen is soft, non-tender, non-distended, normal bowel sounds, no hepatosplenomegaly MSK: extremities are warm, no edema.  Skin: warm, no rash Neuro:  no focal deficits Psych: appropriate affect    Assessment and Plan  1. Hyperlipidemia - intolerant to statins and zetia - she is not interested in  pcsk-9 inhibitor, recent LDLs would not warrant this therapy either  - continue dietary modificaiton.   2. HTN - medications limited due to poor renal function and medication side effects - continue current meds, she will take additional 162m of labetalol prn SBP >160  3. CKD III - follow with renal - we will avoid/limit nephrotoxic drugs.       JArnoldo Lenis M.D.

## 2018-03-31 ENCOUNTER — Encounter: Payer: Self-pay | Admitting: Cardiology

## 2018-04-02 ENCOUNTER — Encounter: Payer: Self-pay | Admitting: Family Medicine

## 2018-04-02 ENCOUNTER — Ambulatory Visit (INDEPENDENT_AMBULATORY_CARE_PROVIDER_SITE_OTHER): Payer: Medicare PPO | Admitting: Family Medicine

## 2018-04-02 ENCOUNTER — Other Ambulatory Visit: Payer: Self-pay

## 2018-04-02 VITALS — BP 136/80 | HR 82 | Temp 97.9°F | Resp 14 | Ht 70.0 in | Wt 180.0 lb

## 2018-04-02 DIAGNOSIS — N184 Chronic kidney disease, stage 4 (severe): Secondary | ICD-10-CM | POA: Diagnosis not present

## 2018-04-02 DIAGNOSIS — L97522 Non-pressure chronic ulcer of other part of left foot with fat layer exposed: Secondary | ICD-10-CM

## 2018-04-02 DIAGNOSIS — E1322 Other specified diabetes mellitus with diabetic chronic kidney disease: Secondary | ICD-10-CM

## 2018-04-02 DIAGNOSIS — E782 Mixed hyperlipidemia: Secondary | ICD-10-CM | POA: Diagnosis not present

## 2018-04-02 DIAGNOSIS — E1365 Other specified diabetes mellitus with hyperglycemia: Secondary | ICD-10-CM

## 2018-04-02 DIAGNOSIS — IMO0002 Reserved for concepts with insufficient information to code with codable children: Secondary | ICD-10-CM

## 2018-04-02 MED ORDER — SULFAMETHOXAZOLE-TRIMETHOPRIM 800-160 MG PO TABS: 1.0000 | ORAL_TABLET | Freq: Two times a day (BID) | ORAL | 0 refills | Status: DC

## 2018-04-02 MED ORDER — GLUCOSE BLOOD VI STRP: ORAL_STRIP | 11 refills | Status: DC

## 2018-04-02 MED ORDER — OXYCODONE-ACETAMINOPHEN 5-325 MG PO TABS: 1.0000 | ORAL_TABLET | Freq: Four times a day (QID) | ORAL | 0 refills | Status: DC | PRN

## 2018-04-02 NOTE — Progress Notes (Signed)
Subjective:    Patient ID: Amanda Krueger, female    DOB: 04-21-1946, 72 y.o.   MRN: [id number]  Patient presents for Toe Issues (thinks L great toe may be infected- discharge from toe at surgical incision site)    Pt here to f/u chronic medical problems  She has been followed by Dr. Clayborne Artist for ulcer of left great toe, has been cut down multiple times, has chronic swelling, typically wears post op shoe but was    wearing a tennis shoe and she felt it rubbed it  started draining fluid a few days ago , but she does not want to return to podiatry at this time due to cost    DM- last A1C was  7.7%, had hypoglycemia episode, had vision changes, CBG was 70, ate a light dinner , did not bring meter  Lantus 20 units,   Novolog 5 units with meals  Has not been back to endocrinology    Seen by Nephrology in March 25th, , Rocaltrol decreased to 0.25mg  every other day , also on iron Every other day, gfr DOWN TO 17, she states she has not discussed needing HD     Seen by cardiology- Dr. Harl Bowie, Hyperlipidemia taking pravastatin three times a week , Last lipid Jan    HTN- told to take extra labetolol if BP is > 160    Review Of Systems:  GEN- denies fatigue, fever, weight loss,weakness, recent illness HEENT- denies eye drainage, change in vision, nasal discharge, CVS- denies chest pain, palpitations RESP- denies SOB, cough, wheeze ABD- denies N/V, change in stools, abd pain GU- denies dysuria, hematuria, dribbling, incontinence MSK- + joint pain, muscle aches, injury Neuro- denies headache, dizziness, syncope, seizure activity       Objective:    BP 136/80   Pulse 82   Temp 97.9 F (36.6 C) (Oral)   Resp 14   Ht 5\' 10"  (1.778 m)   Wt 180 lb (81.6 kg)   SpO2 98%   BMI 25.83 kg/m  GEN- NAD, alert and oriented x3 HEENT- PERRL, EOMI, non injected sclera, pink conjunctiva, MMM, oropharynx clear Neck- Supple,  CVS- RRR, no murmur RESP-CTAB ABD-NABS,soft,NT,ND EXT- No edema, Left  great toe- chronically swollen with hypertrophic nail, has blood at tip, callus with small ulceration ( chronic0 and clear fluid fluid, no odor, no pain,walking boot  Pulses- Radial 2+ DP- diminished        Assessment & Plan:      Problem List Items Addressed This Visit      Unprioritized   Chronic kidney disease (CKD), stage IV (severe) ([place]) - Primary   Uncontrolled secondary diabetes mellitus with stage 4 CKD (GFR 15-29) ([place])    Recheck A1C, her kidney function continues to worsen So I will need to continue to decrease insulin to accomodate this Blood pressure looks good, no changes       Relevant Orders   CBC with Differential/Platelet   Lipid panel   Hemoglobin A1c   Hyperlipidemia    Check lipids       Relevant Orders   Lipid panel    Other Visit Diagnoses    Skin ulcer of left foot with fat layer exposed ([place])       given bactrim antibiuotic, keep clean, no erythema, or odor, onluy serous drainage noted, advised if this worsens needs to go back to podiatry and she agrees       Note: This dictation was prepared with Dragon dictation along with  smaller phrase technology. Any transcriptional errors that result from this process are unintentional.

## 2018-04-02 NOTE — Assessment & Plan Note (Signed)
Recheck A1C, her kidney function continues to worsen So I will need to continue to decrease insulin to accomodate this Blood pressure looks good, no changes

## 2018-04-02 NOTE — Assessment & Plan Note (Signed)
Check lipids 

## 2018-04-02 NOTE — Patient Instructions (Addendum)
F/U 3 months  Take antibiotics as prescribed  More fiber, miralax or stool softner

## 2018-04-03 LAB — HEMOGLOBIN A1C
HEMOGLOBIN A1C: 8.6 %{Hb} — AB (ref <5.7)
Mean Plasma Glucose: 200 (calc)
eAG (mmol/L): 11.1 (calc)

## 2018-04-03 LAB — CBC WITH DIFFERENTIAL/PLATELET
BASOS PCT: 0.5 %
Basophils Absolute: 40 cells/uL (ref 0–200)
EOS PCT: 2.3 %
Eosinophils Absolute: 182 cells/uL (ref 15–500)
HCT: 30 % — ABNORMAL LOW (ref 35.0–45.0)
Hemoglobin: 10 g/dL — ABNORMAL LOW (ref 11.7–15.5)
Lymphs Abs: 1959 cells/uL (ref 850–3900)
MCH: 28.2 pg (ref 27.0–33.0)
MCHC: 33.3 g/dL (ref 32.0–36.0)
MCV: 84.7 fL (ref 80.0–100.0)
MONOS PCT: 6.1 %
MPV: 11.5 fL (ref 7.5–12.5)
Neutro Abs: 5238 cells/uL (ref 1500–7800)
Neutrophils Relative %: 66.3 %
PLATELETS: 231 10*3/uL (ref 140–400)
RBC: 3.54 10*6/uL — ABNORMAL LOW (ref 3.80–5.10)
RDW: 14.4 % (ref 11.0–15.0)
TOTAL LYMPHOCYTE: 24.8 %
WBC mixed population: 482 cells/uL (ref 200–950)
WBC: 7.9 10*3/uL (ref 3.8–10.8)

## 2018-04-03 LAB — LIPID PANEL
CHOLESTEROL: 214 mg/dL — AB (ref <200)
HDL: 69 mg/dL (ref >50)
LDL CHOLESTEROL (CALC): 128 mg/dL — AB
Non-HDL Cholesterol (Calc): 145 mg/dL (calc) — ABNORMAL HIGH (ref <130)
Total CHOL/HDL Ratio: 3.1 (calc) (ref <5.0)
Triglycerides: 76 mg/dL (ref <150)

## 2018-04-05 ENCOUNTER — Encounter: Payer: Self-pay | Admitting: *Deleted

## 2018-04-08 ENCOUNTER — Ambulatory Visit: Payer: Medicare PPO | Admitting: Podiatry

## 2018-04-08 ENCOUNTER — Encounter: Payer: Self-pay | Admitting: Podiatry

## 2018-04-08 DIAGNOSIS — L97522 Non-pressure chronic ulcer of other part of left foot with fat layer exposed: Secondary | ICD-10-CM | POA: Diagnosis not present

## 2018-04-08 DIAGNOSIS — E0843 Diabetes mellitus due to underlying condition with diabetic autonomic (poly)neuropathy: Secondary | ICD-10-CM

## 2018-04-08 DIAGNOSIS — I70245 Atherosclerosis of native arteries of left leg with ulceration of other part of foot: Secondary | ICD-10-CM | POA: Diagnosis not present

## 2018-04-08 MED ORDER — SULFAMETHOXAZOLE-TRIMETHOPRIM 800-160 MG PO TABS: 1.0000 | ORAL_TABLET | Freq: Two times a day (BID) | ORAL | 0 refills | Status: DC

## 2018-04-10 NOTE — Progress Notes (Signed)
   Subjective:  72 year old female with PMHx of T2DM presenting today with a chief complaint of an ulceration of the right great toe that appeared about one week ago. She reports associated swelling and drainage from the area. She has been taking Bactrim DS prescribed by her PCP for treatment. There are no modifying factors noted. Patient is here for further evaluation and treatment.   Past Medical History:  Diagnosis Date  . Diabetes mellitus   . Diabetes mellitus due to underlying condition with stage 3 chronic kidney disease, with long-term current use of insulin ([place]) 08/15/2016  . DJD (degenerative joint disease)   . Hyperlipidemia   . Hypertension       Objective/Physical Exam General: The patient is alert and oriented x3 in no acute distress.  Dermatology:  Wound #1 noted to the left great toe measuring 0.5 x 1.0 x 0.2 cm (LxWxD).   To the noted ulceration(s), there is no eschar. There is a moderate amount of slough, fibrin, and necrotic tissue noted. Granulation tissue and wound base is red. There is a minimal amount of serosanguineous drainage noted. There is no exposed bone muscle-tendon ligament or joint. There is no malodor. Periwound integrity is intact. Skin is warm, dry and supple bilateral lower extremities.  Vascular: Palpable pedal pulses bilaterally. No edema or erythema noted. Capillary refill within normal limits.  Neurological: Epicritic and protective threshold diminished bilaterally.   Musculoskeletal Exam: Range of motion within normal limits to all pedal and ankle joints bilateral. Muscle strength 5/5 in all groups bilateral.   Assessment: #1 ulceration of the left great toe secondary to diabetes mellitus #2 diabetes mellitus w/ peripheral neuropathy   Plan of Care:  #1 Patient was evaluated. #2 medically necessary excisional debridement including subcutaneous tissue was performed using a tissue nipper and a chisel blade. Excisional debridement of all the  necrotic nonviable tissue down to healthy bleeding viable tissue was performed with post-debridement measurements same as pre-. #3 the wound was cleansed and dry sterile dressing applied. #4 Continue taking Bactrim DS as directed by PCP.  #5 Continue weightbearing in post op shoe.  #6 patient is to return to clinic in 3 weeks with Dr. Jacqualyn Posey.   Edrick Kins, DPM [facility]  Dr. Edrick Kins, Kevin                                        [place], [place] [address]                Office [phone]  Fax [phone]

## 2018-04-29 ENCOUNTER — Ambulatory Visit: Payer: Medicare PPO | Admitting: Podiatry

## 2018-04-29 DIAGNOSIS — L97522 Non-pressure chronic ulcer of other part of left foot with fat layer exposed: Secondary | ICD-10-CM

## 2018-04-29 DIAGNOSIS — B351 Tinea unguium: Secondary | ICD-10-CM

## 2018-04-29 DIAGNOSIS — E0843 Diabetes mellitus due to underlying condition with diabetic autonomic (poly)neuropathy: Secondary | ICD-10-CM | POA: Diagnosis not present

## 2018-04-29 DIAGNOSIS — M79676 Pain in unspecified toe(s): Secondary | ICD-10-CM

## 2018-05-01 NOTE — Progress Notes (Signed)
Subjective: 72 year old female presents the office today for follow-up evaluation of wound to left big toe.  She states that she did not finish her antibiotics she states that she was prior to taking antibiotics.  She feels the toe is doing well and she states that there has not been any significant swelling compared to the other toe.  Denies any redness or warmth or any drainage.  Otherwise her nails are thick and elongated causing irritation.  No redness or drainage of the toenail sites.  No other concerns today. Denies any systemic complaints such as fevers, chills, nausea, vomiting. No acute changes since last appointment, and no other complaints at this time.   Objective: AAO x3, NAD DP/PT pulses palpable bilaterally, CRT less than 3 seconds Protective sensation decreased with Simms Weinstein monofilament Hyperkeratotic tissue is present to the distal plantar aspect of the left hallux.  Upon debridement the area is pre-ulcerative but there is no identified ulceration today.  There is no probing, undermining or tunneling there is no drainage or pus.  There is no erythema.  There is very minimal edema to the left hallux compared to the contralateral extremity.  There is no fluctuation or crepitation. Nails are hypertrophic, dystrophic, discolored with ill-defined discoloration.  Tenderness nails 1-5 bilaterally.  No swelling redness or drainage or any signs of infection. No areas of pinpoint bony tenderness or pain with vibratory sensation. MMT 5/5, ROM WNL. No edema, erythema, increase in warmth to bilateral lower extremities.  No open lesions or pre-ulcerative lesions.  No pain with calf compression, swelling, warmth, erythema  Assessment: Ulceration which appears to be healed left hallux, symptomatic onychomycosis  Plan: -All treatment options discussed with the patient including all alternatives, risks, complications.  -I debrided the hyperkeratotic tissue the left hallux today without any  complications.  There is a small pre-ulcerative area but there is no definite open sore identified today.  Small amount of antibiotic ointment was applied followed by a bandage.  I want her to continue with daily dressing changes.  Offloading at all times.  She can slowly transition back into regular shoe as tolerable but possible that her feet daily there is any changes to call the office. -Nails debrided x10 without any complications or bleeding -Discussed daily foot inspection  Trula Slade DPM  -Patient encouraged to call the office with any questions, concerns, change in symptoms.

## 2018-05-12 ENCOUNTER — Other Ambulatory Visit: Payer: Self-pay | Admitting: Family Medicine

## 2018-05-14 ENCOUNTER — Other Ambulatory Visit: Payer: Self-pay | Admitting: *Deleted

## 2018-05-14 MED ORDER — INSULIN GLARGINE 100 UNIT/ML ~~LOC~~ SOLN: SUBCUTANEOUS | 2 refills | Status: DC

## 2018-06-17 ENCOUNTER — Ambulatory Visit (INDEPENDENT_AMBULATORY_CARE_PROVIDER_SITE_OTHER): Payer: Medicare PPO

## 2018-06-17 ENCOUNTER — Ambulatory Visit: Payer: Medicare PPO | Admitting: Orthopedic Surgery

## 2018-06-17 ENCOUNTER — Encounter: Payer: Self-pay | Admitting: Orthopedic Surgery

## 2018-06-17 VITALS — BP 147/75 | HR 85 | Ht 70.0 in | Wt 175.0 lb

## 2018-06-17 DIAGNOSIS — M5441 Lumbago with sciatica, right side: Secondary | ICD-10-CM

## 2018-06-17 DIAGNOSIS — M25562 Pain in left knee: Secondary | ICD-10-CM | POA: Diagnosis not present

## 2018-06-17 DIAGNOSIS — G8929 Other chronic pain: Secondary | ICD-10-CM | POA: Diagnosis not present

## 2018-06-17 DIAGNOSIS — Z9889 Other specified postprocedural states: Secondary | ICD-10-CM

## 2018-06-17 DIAGNOSIS — M5442 Lumbago with sciatica, left side: Secondary | ICD-10-CM

## 2018-06-17 NOTE — Progress Notes (Signed)
Progress Note   Patient ID: Amanda Krueger, female   DOB: 1946-12-01, 72 y.o.   MRN: [id number] Chief Complaint  Patient presents with  . Knee Pain    left   . Leg Pain    bilateral legs cramping, states long history of lumbar pain      Chief Complaint  Patient presents with  . Knee Pain    left   . Leg Pain    bilateral legs cramping, states long history of lumbar pain     72 year old female status post left knee arthroscopy complicated by infection presents with bilateral knee pain left worse than right chronic lower back pain and difficulty walking.  She complains of dull aching nonradiating left knee pain for several years worse in the last month associated with decreased range of motion difficulty ambulating  She is had chronic back pain and walks with a crouched gait.  She did have recommendations for physical therapy in the past    Review of Systems  Gastrointestinal: Negative.   Genitourinary: Negative.    Current Meds  Medication Sig  . amLODipine (NORVASC) 10 MG tablet Take 1 tablet (10 mg total) by mouth daily.  Marland Kitchen aspirin 325 MG tablet Take 1 tablet (325 mg total) by mouth daily.  . Blood Glucose Monitoring Suppl (ACCU-CHEK NANO SMARTVIEW) W/DEVICE KIT   . calcitRIOL (ROCALTROL) 0.5 MCG capsule Take 0.5 mcg by mouth daily.  . Cholecalciferol (VITAMIN D) 2000 units CAPS Take 1 capsule by mouth daily.  . cloNIDine (CATAPRES) 0.2 MG tablet Take 1 tablet (0.2 mg total) by mouth 2 (two) times daily.  . fish oil-omega-3 fatty acids 1000 MG capsule Take 1 g by mouth daily.   Marland Kitchen glucose blood (ACCU-CHEK SMARTVIEW) test strip Use as directed to montior FSBS 3x daily d/t fluctuating blood glucose. Dx: E11.65.  Marland Kitchen insulin glargine (LANTUS) 100 UNIT/ML injection INJECT 20 UNITS SUBCUTANEOUSLY EVERY MORNING  . insulin regular (NOVOLIN R RELION) 100 units/mL injection INJECT(0.04-0.06 MLS) 4-6 UNITS SUBCUTANEOUSLY THREE TIMES DAILY BEFORE MEAL(S)  . iron polysaccharides (NIFEREX)  150 MG capsule Take 150 mg by mouth. EVERY OTHER DAY  . labetalol (NORMODYNE) 100 MG tablet Take 1 tablet twice daily may take additional 100 mg for SBP < 160  . MAGNESIUM OXIDE PO Take 1 tablet by mouth daily.  Marland Kitchen MISC NATURAL PRODUCTS PO Take by mouth. CBD oil as needed for pain - 5 drops under tongue daily  . mupirocin ointment (BACTROBAN) 2 % Apply 1 application topically 2 (two) times daily.  Marland Kitchen oxyCODONE-acetaminophen (PERCOCET/ROXICET) 5-325 MG tablet Take 1 tablet by mouth every 6 (six) hours as needed for moderate pain.  . [DISCONTINUED] sulfamethoxazole-trimethoprim (BACTRIM DS,SEPTRA DS) 800-160 MG tablet Take 1 tablet by mouth 2 (two) times daily.    Past Medical History:  Diagnosis Date  . Diabetes mellitus   . Diabetes mellitus due to underlying condition with stage 3 chronic kidney disease, with long-term current use of insulin ([place]) 08/15/2016  . DJD (degenerative joint disease)   . Hyperlipidemia   . Hypertension      Allergies  Allergen Reactions  . Statins Other (See Comments)    Joint pain     BP (!) 147/75   Pulse 85   Ht '5\' 10"'  (1.778 m)   Wt 175 lb (79.4 kg)   BMI 25.11 kg/m    Physical Exam  Constitutional: She is oriented to person, place, and time. She appears well-developed and well-nourished.  Neurological: She is alert and  oriented to person, place, and time. Gait abnormal.  She requires a cane walks with walks with a crouched gait  Psychiatric: She has a normal mood and affect. Judgment normal.  Vitals reviewed.   Ortho Exam  Right knee is nontender she does not have full extension her flexion is 115 degrees collateral and cruciate ligaments are stable muscle tone and strength are normal no tremor skin is intact pulse and temperature normal in the leg and sensation is intact as well  Left knee varus alignment tenderness medial joint line the knee has approximately a range of motion of 10 to 90 degrees stable and anterior posterior plane no  atrophy or tremor skin is intact previous portal sites are noted no effusion pulse and temperature normal sensation is intact  Her deep tendon reflexes are 2+ at the knee 1 at the ankle toes are downgoing on both sides she has negative straight leg raises bilaterally  Arther Abbott, MD   MEDICAL DECISION MAKING   Imaging:  X-ray shows varus left knee with severe disease please see the report  NEW PROBLEM  Encounter Diagnoses  Name Primary?  . S/P left knee arthroscopy 12/2013 Yes  . Chronic pain of left knee   . Chronic midline low back pain with bilateral sciatica      PLAN: (RX., injection, surgery,frx,mri/ct, XR 2 body ares) Injection left knee Procedure note left knee injection verbal consent was obtained to inject left knee joint  Timeout was completed to confirm the site of injection  The medications used were 40 mg of Depo-Medrol and 1% lidocaine 3 cc  Anesthesia was provided by ethyl chloride and the skin was prepped with alcohol.  After cleaning the skin with alcohol a 20-gauge needle was used to inject the left knee joint. There were no complications. A sterile bandage was applied.    The patient had an infection in her left knee I would be very wary of doing a knee replacement on her.  She agrees.  We did recommend therapy for her lower back  Follow-up as needed  No orders of the defined types were placed in this encounter.  3:03 PM 06/17/2018

## 2018-07-01 ENCOUNTER — Ambulatory Visit: Payer: Medicare PPO | Admitting: Podiatry

## 2018-07-02 ENCOUNTER — Telehealth: Payer: Self-pay | Admitting: Family Medicine

## 2018-07-02 DIAGNOSIS — E0822 Diabetes mellitus due to underlying condition with diabetic chronic kidney disease: Secondary | ICD-10-CM

## 2018-07-02 DIAGNOSIS — Z794 Long term (current) use of insulin: Principal | ICD-10-CM

## 2018-07-02 DIAGNOSIS — N183 Chronic kidney disease, stage 3 unspecified: Secondary | ICD-10-CM

## 2018-07-02 DIAGNOSIS — Z1239 Encounter for other screening for malignant neoplasm of breast: Secondary | ICD-10-CM

## 2018-07-02 NOTE — Telephone Encounter (Signed)
Patient needs a letter from Dr. Buelah Manis stating she can't attend jury duty she needs this by 07/08/18 if anyway possible. She believes she is scheduled for 07/15/18.  CB# [phone]

## 2018-07-02 NOTE — Telephone Encounter (Signed)
To MD

## 2018-07-02 NOTE — Telephone Encounter (Signed)
Orders placed.

## 2018-07-02 NOTE — Telephone Encounter (Signed)
Call placed to patient and patient made aware.  

## 2018-07-02 NOTE — Telephone Encounter (Signed)
Called patient per the Quality Metric report.   Patient has 5 gaps. They include mammogram, colonoscopy, A1C, diabetic eye exam, statin use in person with diabetes.   I have also looked to see when their last AWV was and if they need that appointment I scheduled.  She is scheduled to see Olean Ree on 07/29/18  I have reached out to patient to see if they have had appointments to close the gaps. She hasn't had any appointments.    If they haven't had appointments I have asked if we can set up a referral to [facility] for Mammogram, her previous eye doctor has retired she is agreeable to see [facility] in [place], she will have lab drawn on 07/29/18.   Please place Referral for:  Mammogram -[facility] Diabetic eye exam- she is agreeable to see [facility] in [place] since her previous eye doctor retired.  CB# [phone]

## 2018-07-02 NOTE — Telephone Encounter (Signed)
Okay to place eye doctor, mammogram referral acn discuss colonoscopy in office

## 2018-07-02 NOTE — Telephone Encounter (Signed)
She has to bring me a copy of the jury summons

## 2018-07-03 ENCOUNTER — Ambulatory Visit: Payer: Medicare PPO | Admitting: Family Medicine

## 2018-07-03 ENCOUNTER — Other Ambulatory Visit: Payer: Self-pay | Admitting: Family Medicine

## 2018-07-03 DIAGNOSIS — Z1231 Encounter for screening mammogram for malignant neoplasm of breast: Secondary | ICD-10-CM

## 2018-07-05 ENCOUNTER — Other Ambulatory Visit: Payer: Self-pay | Admitting: *Deleted

## 2018-07-05 ENCOUNTER — Encounter: Payer: Self-pay | Admitting: Family Medicine

## 2018-07-05 MED ORDER — GLUCOSE BLOOD VI STRP: ORAL_STRIP | 12 refills | Status: DC

## 2018-07-05 NOTE — Telephone Encounter (Signed)
Received jury summons for week of 07/15/2018.  Routed to provider.

## 2018-07-08 ENCOUNTER — Ambulatory Visit (HOSPITAL_COMMUNITY): Payer: Medicare PPO

## 2018-07-08 NOTE — Telephone Encounter (Signed)
Letter for [employer] completed.   Call placed to patient and patient made aware.   Of note, letter due today and form has not been completed by patient. Advised that she will have to come to office to complete form before we can fax it.

## 2018-07-29 ENCOUNTER — Encounter: Payer: Self-pay | Admitting: Physician Assistant

## 2018-07-29 ENCOUNTER — Other Ambulatory Visit: Payer: Self-pay

## 2018-07-29 ENCOUNTER — Ambulatory Visit (INDEPENDENT_AMBULATORY_CARE_PROVIDER_SITE_OTHER): Payer: Medicare PPO | Admitting: Physician Assistant

## 2018-07-29 VITALS — BP 140/60 | HR 65 | Temp 97.7°F | Resp 18 | Ht 68.0 in | Wt 173.0 lb

## 2018-07-29 DIAGNOSIS — Z1211 Encounter for screening for malignant neoplasm of colon: Secondary | ICD-10-CM

## 2018-07-29 DIAGNOSIS — M5136 Other intervertebral disc degeneration, lumbar region: Secondary | ICD-10-CM

## 2018-07-29 DIAGNOSIS — N184 Chronic kidney disease, stage 4 (severe): Secondary | ICD-10-CM

## 2018-07-29 DIAGNOSIS — E1365 Other specified diabetes mellitus with hyperglycemia: Secondary | ICD-10-CM

## 2018-07-29 DIAGNOSIS — Z0000 Encounter for general adult medical examination without abnormal findings: Secondary | ICD-10-CM

## 2018-07-29 DIAGNOSIS — E785 Hyperlipidemia, unspecified: Secondary | ICD-10-CM

## 2018-07-29 DIAGNOSIS — Z1231 Encounter for screening mammogram for malignant neoplasm of breast: Secondary | ICD-10-CM

## 2018-07-29 DIAGNOSIS — Z1212 Encounter for screening for malignant neoplasm of rectum: Secondary | ICD-10-CM

## 2018-07-29 DIAGNOSIS — IMO0002 Reserved for concepts with insufficient information to code with codable children: Secondary | ICD-10-CM

## 2018-07-29 DIAGNOSIS — I10 Essential (primary) hypertension: Secondary | ICD-10-CM | POA: Diagnosis not present

## 2018-07-29 DIAGNOSIS — M51369 Other intervertebral disc degeneration, lumbar region without mention of lumbar back pain or lower extremity pain: Secondary | ICD-10-CM

## 2018-07-29 DIAGNOSIS — E1322 Other specified diabetes mellitus with diabetic chronic kidney disease: Secondary | ICD-10-CM

## 2018-07-29 DIAGNOSIS — L609 Nail disorder, unspecified: Secondary | ICD-10-CM

## 2018-07-29 DIAGNOSIS — L97501 Non-pressure chronic ulcer of other part of unspecified foot limited to breakdown of skin: Secondary | ICD-10-CM

## 2018-07-29 NOTE — Progress Notes (Signed)
Patient ID: Amanda Krueger MRN: [id number], DOB: [birth year], 72 y.o. Date of Encounter: 07/29/2018,   Chief Complaint: Medicare Physical, Discuss Gaps in Care  HPI: 72 y.o. y/o female here for above.   Amanda Krueger usually sees Dr. Buelah Manis as her PCP. Is on my schedule today to update Medicare physical and update gaps in care. She has no specific concerns to address today.  List the following specialists that she sees on a routine basis: Dr. Jacqualyn Posey ----- Podiatry Dr. Harl Bowie -------- Cardiology Dr. Lowanda Foster----- Renal Dr. Aline Brochure----- for her knee Says that she is also currently seeing physical therapy for her back.  Also ask how she is currently administering insulin. States that she is giving 20 units every night. States that she is also supposed to be giving 5 units before each meal but she does not do this on a routine basis.  Regarding hyperlipidemia---- her chart documents that she has been intolerant to statins.   This is documented under "allergy section". Also I reviewed Dr. Nelly Laurence last note -- Cardiology-- 03/27/2018.   That note documents---  Hyperlipidemia --Had been on pravastatin 3 times a week, did not tolerate this due to muscle aches. --Allergy to Zetia --LDL has not been bad enough to consider PCSK9 inhibitor --Working on dietary modification  Specific gaps in care per Quality Metric Report were documented in a note in the chart on 07/02/2018.   That states the patient has 5 gaps.   They include mammogram, colonoscopy, A1c, diabetic eye exam, statin use in person with diabetes. Staff went ahead and scheduled mammogram to be performed at [facility]. At that time staff also noted that her previous doctor had retired up and they were patient was agreeable to see [facility] in [place] They scheduled plan to schedule her for visit with me today and obtain A1c and other updates at that today's visit.   Today patient states that she did have the mammogram scheduled  but had problem with her car that morning and did not get to that mammogram.  Is agreeable for me to reschedule mammogram at [facility] today. She states that she does have eye appointment scheduled for September.  Has the exact information on her calendar at home. Today I discussed colorectal cancer screening.  Discussed colonoscopy versus Cologuard.  Ultimately she is agreeable for me to schedule her with a GI doctor in [place] for colonoscopy.   Past Medical History:  Diagnosis Date  . Diabetes mellitus   . Diabetes mellitus due to underlying condition with stage 3 chronic kidney disease, with long-term current use of insulin ([place]) 08/15/2016  . DJD (degenerative joint disease)   . Hyperlipidemia   . Hypertension      Past Surgical History:  Procedure Laterality Date  .  rt toe debridement    . CATARACT EXTRACTION, BILATERAL     right eye-2007, left eye-2012  . EXAM UNDER ANESTHESIA WITH MANIPULATION OF KNEE Left 12/26/2013   Procedure: EXAM UNDER ANESTHESIA WITH MANIPULATION OF KNEE;  Surgeon: Carole Civil, MD;  Location: [facility];  Service: Orthopedics;  Laterality: Left;  . KNEE ARTHROSCOPY Left 12/26/2013   Procedure: ARTHROSCOPY KNEE;  Surgeon: Carole Civil, MD;  Location: [facility];  Service: Orthopedics;  Laterality: Left;  . right toe     Great toe  . SYNOVECTOMY Left 12/26/2013   Procedure: EXTENSIVE SYNOVECTOMY LEFT KNEE;  Surgeon: Carole Civil, MD;  Location: [facility];  Service: Orthopedics;  Laterality: Left;  .  WOUND DEBRIDEMENT Left 12/26/2013   Procedure: LEFT GREAT TOE CALLOUS DEBRIDEMENT;  Surgeon: Carole Civil, MD;  Location: [facility];  Service: Orthopedics;  Laterality: Left;    Home Meds:  Outpatient Medications Prior to Visit  Medication Sig Dispense Refill  . amLODipine (NORVASC) 10 MG tablet Take 1 tablet (10 mg total) by mouth daily. 90 tablet 1  . aspirin 325 MG tablet Take 1 tablet (325 mg total) by mouth daily. 30 tablet 12  . Blood Glucose  Monitoring Suppl (ACCU-CHEK NANO SMARTVIEW) W/DEVICE KIT     . calcitRIOL (ROCALTROL) 0.5 MCG capsule Take 0.5 mcg by mouth daily.    . Cholecalciferol (VITAMIN D) 2000 units CAPS Take 1 capsule by mouth daily.    . cloNIDine (CATAPRES) 0.2 MG tablet Take 1 tablet (0.2 mg total) by mouth 2 (two) times daily. 180 tablet 3  . fish oil-omega-3 fatty acids 1000 MG capsule Take 1 g by mouth daily.     Marland Kitchen glucose blood (ACCU-CHEK AVIVA) test strip Use as instructed to monitor FSBS 3x daily. Dx: E11.65. 100 each 12  . insulin glargine (LANTUS) 100 UNIT/ML injection INJECT 20 UNITS SUBCUTANEOUSLY EVERY MORNING 1 vial 2  . insulin regular (NOVOLIN R RELION) 100 units/mL injection INJECT(0.04-0.06 MLS) 4-6 UNITS SUBCUTANEOUSLY THREE TIMES DAILY BEFORE MEAL(S) 10 mL 3  . iron polysaccharides (NIFEREX) 150 MG capsule Take 150 mg by mouth. EVERY OTHER DAY    . labetalol (NORMODYNE) 100 MG tablet Take 1 tablet twice daily may take additional 100 mg for SBP < 160 270 tablet 1  . MAGNESIUM OXIDE PO Take 1 tablet by mouth daily.    . Melatonin 5 MG CAPS Take by mouth.    Marland Kitchen MISC NATURAL PRODUCTS PO Take by mouth. CBD oil as needed for pain - 5 drops under tongue daily    . oxyCODONE-acetaminophen (PERCOCET/ROXICET) 5-325 MG tablet Take 1 tablet by mouth every 6 (six) hours as needed for moderate pain. 30 tablet 0  . mupirocin ointment (BACTROBAN) 2 % Apply 1 application topically 2 (two) times daily. 30 g 2   No facility-administered medications prior to visit.     Allergies:  Allergies  Allergen Reactions  . Statins Other (See Comments)    Joint pain    Social History   Socioeconomic History  . Marital status: Divorced    Spouse name: Not on file  . Number of children: Not on file  . Years of education: Not on file  . Highest education level: Not on file  Occupational History  . Not on file  Social Needs  . Financial resource strain: Not on file  . Food insecurity:    Worry: Not on file     Inability: Not on file  . Transportation needs:    Medical: Not on file    Non-medical: Not on file  Tobacco Use  . Smoking status: Never Smoker  . Smokeless tobacco: Never Used  Substance and Sexual Activity  . Alcohol use: No  . Drug use: No  . Sexual activity: Not on file  Lifestyle  . Physical activity:    Days per week: Not on file    Minutes per session: Not on file  . Stress: Not on file  Relationships  . Social connections:    Talks on phone: Not on file    Gets together: Not on file    Attends religious service: Not on file    Active member of club or organization: Not on  file    Attends meetings of clubs or organizations: Not on file    Relationship status: Not on file  . Intimate partner violence:    Fear of current or ex partner: Not on file    Emotionally abused: Not on file    Physically abused: Not on file    Forced sexual activity: Not on file  Other Topics Concern  . Not on file  Social History Narrative  . Not on file    Family History  Problem Relation Age of Onset  . Diabetes Mother   . Stroke Father   . Heart disease Father   . Diabetes Sister   . Diabetes Brother   . Diabetes Sister     Physical Exam: Blood pressure 140/60, pulse 65, temperature 97.7 F (36.5 C), temperature source Oral, resp. rate 18, height '5\' 8"'  (1.727 m), weight 78.5 kg, SpO2 99 %., Body mass index is 26.3 kg/m. General: Well developed, well nourished AAF. Appears in no acute distress. Neck: Supple. Trachea midline. No thyromegaly. Full ROM. No lymphadenopathy.No Carotid Bruits. Lungs: Clear to auscultation bilaterally without wheezes, rales, or rhonchi. Breathing is of normal effort and unlabored. Cardiovascular: RRR with S1 S2. No murmurs, rubs, or gallops. Distal pulses 2+ symmetrically. No carotid or abdominal bruits. Skin: Warm and moist without erythema, ecchymosis, wounds, or rash. Neuro: A+Ox3. CN II-XII grossly intact. Moves all extremities spontaneously.    Psych:  Responds to questions appropriately with a normal affect.    Diabetic Foot Exam: Inspection of her feet shows abnormalities.  Abnormal toenail on right first toe.  Noted that the left fourth toenail is off and that there is some clear drainage present there.  This looks like it is acute.  When I mention this to patient she states that this is new and that she did not know that toenail had come off.  Says that the sheet must of pulled it off last night.  She is to monitor this closely and follow-up with podiatry if it is not healing.  Assessment/Plan:  72 y.o. y/o female here for CPE  1. Medicare annual wellness visit, subsequent See below for remainder of Medicare visit note.  2. Essential hypertension Blood Pressure is stable.  3. Uncontrolled secondary diabetes mellitus with stage 4 CKD (GFR 15-29) (HCC) Check A1c to monitor.  She reports that she is administering 20 units of Insulin every night. She reports that she is also supposed to be giving 5 units with each meal but she does not do this.  4. DDD (degenerative disc disease), lumbar She states that she is currently seeing physical therapy for her low back pain.  5. Nail abnormalities Managed by podiatry.  6. Chronic kidney disease (CKD), stage IV (severe) ([place]) Managed by Dr. Lowanda Foster.  7. Hyperlipidemia, unspecified hyperlipidemia type Has been managed by Dr. Buelah Manis and cardiology.  She has not been able to even tolerate low-dose pravastatin or Zetia.  Is not felt to be a candidate for PCSK9 inhibitor.    Screening Mammogram: --- Is overdue for mammogram.  Our staff and recently scheduled her for a mammogram but she had to cancel at that morning.  She is agreeable for me to reschedule so I am rescheduling mammogram.   Colorectal Cancer Screening: --She reports that she has never had a colonoscopy.  Today discussed colonoscopy versus Cologuard.  Ultimately she is agreeable for me to go ahead and put in referral  to GI to plan for colonoscopy.    Subjective:  Patient presents for Medicare Annual/Subsequent preventive examination.   Review Past Medical/Family/Social: This information is reviewed today.  Risk Factors  Current exercise habits: Mobility is severely limited secondary to back pain and arthritis.  She is having difficulty even walking down the hallway of our office. Dietary issues discussed: She been educated long-term regarding diabetic, low-cholesterol, renal diet.  Cardiac risk factors: She sees cardiology on a routine basis.  They are managing this in addition to as helping manage hypertension hyperlipidemia and diabetes.  Depression Screen  (Note: if answer to either of the following is "Yes", a more complete depression screening is indicated)  Over the past two weeks, have you felt down, depressed or hopeless? No Over the past two weeks, have you felt little interest or pleasure in doing things? No Have you lost interest or pleasure in daily life? No Do you often feel hopeless? No Do you cry easily over simple problems? No   Activities of Daily Living  In your present state of health, do you have any difficulty performing the following activities?:  Driving? No  Managing money? No  Feeding yourself? No  Getting from bed to chair? No  Climbing a flight of stairs? No  Preparing food and eating?: No  Bathing or showering? No  Getting dressed: No  Getting to the toilet? No  Using the toilet:No  Moving around from place to place: No  In the past year have you fallen or had a near fall?:No  Are you sexually active? No  Do you have more than one partner? No   Hearing Difficulties: No  Do you often ask people to speak up or repeat themselves? No  Do you experience ringing or noises in your ears? No Do you have difficulty understanding soft or whispered voices? No  Do you feel that you have a problem with memory? No Do you often misplace items? No  Do you feel safe at  home? Yes  Cognitive Testing  Alert? Yes Normal Appearance?Yes  Oriented to person? Yes Place? Yes  Time? Yes  Recall of three objects? Yes  Can perform simple calculations? Yes  Displays appropriate judgment?Yes  Can read the correct time from a watch face?Yes   List the Names of Other Physician/Practitioners you currently use:  These are all listed above at the beginning of today's note.  Indicate any recent Medical Services you may have received from other than [facility] providers in the past year (date may be approximate).  Is all included in note above.  Screening Tests / Date Colonoscopy ---- see above.                    Mammogram ---- see above      Assessment:    Annual wellness medicare exam   Plan:    During the course of the visit the patient was educated and counseled about appropriate screening and preventive services including:  Screening mammography  Colorectal cancer screening  Medicare Attestation  I have personally reviewed:  The patient's medical and social history  Their use of alcohol, tobacco or illicit drugs  Their current medications and supplements  The patient's functional ability including ADLs,fall risks, home safety risks, cognitive, and hearing and visual impairment  Diet and physical activities  No evidence for depression or mood disorders  The patient's weight, height, BMI have been recorded in the chart. I have made referrals, counseling, and provided education to the patient based on review of the above and I have  provided the patient with a written personalized care plan for preventive services.      Marin Olp [place], [place], [facility] 07/29/2018 8:42 AM

## 2018-07-30 LAB — HEMOGLOBIN A1C
HEMOGLOBIN A1C: 9.7 %{Hb} — AB (ref <5.7)
MEAN PLASMA GLUCOSE: 232 (calc)
eAG (mmol/L): 12.8 (calc)

## 2018-08-08 ENCOUNTER — Ambulatory Visit (INDEPENDENT_AMBULATORY_CARE_PROVIDER_SITE_OTHER): Payer: Medicare PPO | Admitting: Physician Assistant

## 2018-08-08 ENCOUNTER — Encounter: Payer: Self-pay | Admitting: Physician Assistant

## 2018-08-08 VITALS — BP 130/60 | HR 66 | Temp 98.5°F | Ht 66.0 in | Wt 172.0 lb

## 2018-08-08 DIAGNOSIS — E1322 Other specified diabetes mellitus with diabetic chronic kidney disease: Secondary | ICD-10-CM

## 2018-08-08 DIAGNOSIS — N184 Chronic kidney disease, stage 4 (severe): Secondary | ICD-10-CM | POA: Diagnosis not present

## 2018-08-08 DIAGNOSIS — IMO0002 Reserved for concepts with insufficient information to code with codable children: Secondary | ICD-10-CM

## 2018-08-08 DIAGNOSIS — E1365 Other specified diabetes mellitus with hyperglycemia: Secondary | ICD-10-CM | POA: Diagnosis not present

## 2018-08-08 NOTE — Progress Notes (Signed)
Patient ID: Amanda Krueger MRN: [id number], DOB: [birth year], 72 y.o. Date of Encounter: 08/08/2018, 2:12 PM    Chief Complaint:  Chief Complaint  Patient presents with  . Diabetes     HPI: 72 y.o. year old female since with above.  Dr. Buelah Manis is her PCP.  Amanda Krueger usually sees her for her visits.  However, Amanda Krueger was recently scheduled with me for visit on 07/29/2018 to help close gaps in her care. At that visit she reported that she was administering basal insulin 20 units nightly. At that visit obtained A1c.   A1C came back at 9.7. At that time I recommended for her to check fasting morning blood sugars and document these and bring them to follow-up appointment with me.  Today she reports " if I eat like I am supposed to them my blood sugar drops." " 2 nights I woke up with sweats and had to go in the kitchen and eat peanut butter"  Says that she changed the 20 units from night time dosing to morning dosing. Says that she also added back using the 5 units with meals.   Then she eats peanut butter cracker before she goes to bed to help make sure her blood sugar does not drop during the night.  Brings in fasting morning blood sugar readings written on paper.  Readings are: 140, 135, 124, 190, 142, and 90.  Asked what she is eating for meals.  First she mostly eats cereal either honey nut Cheerios or cornflakes.  Sometimes will put either blueberries or strawberries in her cereal. For lunch recently she has been eating mostly salads with chicken.  Asked if she lives alone.  Says her grandson lives with her but he eats his own food and she eats her own separate food.   Home Meds:   Outpatient Medications Prior to Visit  Medication Sig Dispense Refill  . amLODipine (NORVASC) 10 MG tablet Take 1 tablet (10 mg total) by mouth daily. 90 tablet 1  . aspirin 325 MG tablet Take 1 tablet (325 mg total) by mouth daily. 30 tablet 12  . Blood Glucose Monitoring Suppl  (ACCU-CHEK NANO SMARTVIEW) W/DEVICE KIT     . calcitRIOL (ROCALTROL) 0.5 MCG capsule Take 0.5 mcg by mouth daily.    . Cholecalciferol (VITAMIN D) 2000 units CAPS Take 1 capsule by mouth daily.    . cloNIDine (CATAPRES) 0.2 MG tablet Take 1 tablet (0.2 mg total) by mouth 2 (two) times daily. 180 tablet 3  . fish oil-omega-3 fatty acids 1000 MG capsule Take 1 g by mouth daily.     Marland Kitchen glucose blood (ACCU-CHEK AVIVA) test strip Use as instructed to monitor FSBS 3x daily. Dx: E11.65. 100 each 12  . insulin glargine (LANTUS) 100 UNIT/ML injection INJECT 20 UNITS SUBCUTANEOUSLY EVERY MORNING 1 vial 2  . insulin regular (NOVOLIN R RELION) 100 units/mL injection INJECT(0.04-0.06 MLS) 4-6 UNITS SUBCUTANEOUSLY THREE TIMES DAILY BEFORE MEAL(S) 10 mL 3  . iron polysaccharides (NIFEREX) 150 MG capsule Take 150 mg by mouth. EVERY OTHER DAY    . labetalol (NORMODYNE) 100 MG tablet Take 1 tablet twice daily may take additional 100 mg for SBP < 160 270 tablet 1  . MAGNESIUM OXIDE PO Take 1 tablet by mouth daily.    . Melatonin 5 MG CAPS Take by mouth.    Marland Kitchen MISC NATURAL PRODUCTS PO Take by mouth. CBD oil as needed for pain - 5 drops under tongue daily    .  oxyCODONE-acetaminophen (PERCOCET/ROXICET) 5-325 MG tablet Take 1 tablet by mouth every 6 (six) hours as needed for moderate pain. 30 tablet 0   No facility-administered medications prior to visit.     Allergies:  Allergies  Allergen Reactions  . Statins Other (See Comments)    Joint pain      Review of Systems: See HPI for pertinent ROS. All other ROS negative.    Physical Exam: Blood pressure 130/60, pulse 66, temperature 98.5 F (36.9 C), temperature source Oral, height _0  (1.676 m), weight 78 kg, SpO2 93 %., There is no height or weight on file to calculate BMI. General:  AAF. Appears in no acute distress. Neck: Supple. No thyromegaly. No lymphadenopathy. Lungs: Clear bilaterally to auscultation without wheezes, rales, or rhonchi. Breathing  is unlabored. Heart: Regular rhythm. No murmurs, rubs, or gallops. Msk:  Strength and tone normal for age. Extremities/Skin: Warm and dry.  Neuro: Alert and oriented X 3. Moves all extremities spontaneously. Gait is normal. CNII-XII grossly in tact. Psych:  Responds to questions appropriately with a normal affect.     ASSESSMENT AND PLAN:  72 y.o. year old female with  1. Uncontrolled secondary diabetes mellitus with stage 4 CKD (GFR 15-29) ([place]) I discussed that she has to eat a consistent diet.  Needs to eat consistent amount of carbohydrates or else will be impossible to dose insulin to keep blood sugar controlled without getting hypoglycemia. She is to follow-up with compliance with diet. She will continue giving basal insulin 20 units in the morning. She will plan to give 5 units short acting insulin with meals. She is to call here and discuss if she does have any low blood sugars then we can help her adjust.   Signed, Karis Juba, [place], [facility] 08/08/2018 2:12 PM

## 2018-08-20 LAB — HM DIABETES EYE EXAM

## 2018-08-28 ENCOUNTER — Ambulatory Visit (INDEPENDENT_AMBULATORY_CARE_PROVIDER_SITE_OTHER): Payer: Self-pay

## 2018-08-28 DIAGNOSIS — Z1211 Encounter for screening for malignant neoplasm of colon: Secondary | ICD-10-CM

## 2018-08-28 MED ORDER — NA SULFATE-K SULFATE-MG SULF 17.5-3.13-1.6 GM/177ML PO SOLN: 1.0000 | ORAL | 0 refills | Status: DC

## 2018-08-28 NOTE — Patient Instructions (Addendum)
Amanda Krueger   [birth year] MRN: [id number]    Procedure Date: 11/01/18 Time to register: 12:00pm Place to register: [facility] Procedure Time: 1:00pm Scheduled provider: Barney Drain, MD  PREPARATION FOR COLONOSCOPY WITH TRI-LYTE SPLIT PREP  Please notify us immediately if you are diabetic, take iron supplements, or if you are on Coumadin or any other blood thinners.   You will need to purchase 1 fleet enema and 1 box of Bisacodyl 26m tablets. These are available over the counter at your pharmacy.  1 DAY BEFORE PROCEDURE:  DATE: 10/31/18   DAY: Thursday   clear liquids the entire day - NO SOLID FOOD.   Diabetic medications adjustments for today: only take half your normal dosage of diabetes medication. Be sure to check your blood sugar while you are doing your prep.   At 2:00 pm:  Take 2 Bisacodyl tablets.   At 4:00pm:  Start drinking your solution. Make sure you mix well per instructions on the bottle. Try to drink 1 (one) 8 ounce glass every 10-15 minutes until you have consumed HALF the jug. You should complete by 6:00pm.You must keep the left over solution refrigerated until completed next day.  Continue clear liquids. You must drink plenty of clear liquids to prevent dehyration and kidney failure.     DAY OF PROCEDURE:   DATE: 11/01/18   DAY: Friday If you take medications for your heart, blood pressure or breathing, you may take these medications.   Diabetic medications adjustments for today: do not take any diabetes mediations on this morning.  Five hours before your procedure time @ 8:00am:  Finish remaining amout of bowel prep, drinking 1 (one) 8 ounce glass every 10-15 minutes until complete. You have two hours to consume remaining prep.   Three hours before your procedure time '@10' :00am:  Nothing by mouth.   At least one hour before going to the hospital:  Give yourself one Fleet enema. You may take your morning medications with sip of water unless we  have instructed otherwise.      Please see below for Dietary Information.  CLEAR LIQUIDS INCLUDE:  Water Jello (NOT red in color)   Ice Popsicles (NOT red in color)   Tea (sugar ok, no milk/cream) Powdered fruit flavored drinks  Coffee (sugar ok, no milk/cream) Gatorade/ Lemonade/ Kool-Aid  (NOT red in color)   Juice: apple, white grape, white cranberry Soft drinks  Clear bullion, consomme, broth (fat free beef/chicken/vegetable)  Carbonated beverages (any kind)  Strained chicken noodle soup Hard Candy   Remember: Clear liquids are liquids that will allow you to see your fingers on the other side of a clear glass. Be sure liquids are NOT red in color, and not cloudy, but CLEAR.  DO NOT EAT OR DRINK ANY OF THE FOLLOWING:  Dairy products of any kind   Cranberry juice Tomato juice / V8 juice   Grapefruit juice Orange juice     Red grape juice  Do not eat any solid foods, including such foods as: cereal, oatmeal, yogurt, fruits, vegetables, creamed soups, eggs, bread, crackers, pureed foods in a blender, etc.   HELPFUL HINTS FOR DRINKING PREP SOLUTION:   Make sure prep is extremely cold. Mix and refrigerate the the morning of the prep. You may also put in the freezer.   You may try mixing some Crystal Light or Country Time Lemonade if you prefer. Mix in small amounts; add more if necessary.  Try drinking through a straw  Rinse  mouth with water or a mouthwash between glasses, to remove after-taste.  Try sipping on a cold beverage /ice/ popsicles between glasses of prep.  Place a piece of sugar-free hard candy in mouth between glasses.  If you become nauseated, try consuming smaller amounts, or stretch out the time between glasses. Stop for 30-60 minutes, then slowly start back drinking.        OTHER INSTRUCTIONS  You will need a responsible adult at least 71 years of age to accompany you and drive you home. This person must remain in the waiting room during your  procedure. The hospital will cancel your procedure if you do not have a responsible adult with you.   1. Wear loose fitting clothing that is easily removed. 2. Leave jewelry and other valuables at home.  3. Remove all body piercing jewelry and leave at home. 4. Total time from sign-in until discharge is approximately 2-3 hours. 5. You should go home directly after your procedure and rest. You can resume normal activities the day after your procedure. 6. The day of your procedure you should not:  Drive  Make legal decisions  Operate machinery  Drink alcohol  Return to work   You may call the office (Dept: [phone]) before 5:00pm, or page the doctor on call [phone]) after 5:00pm, for further instructions, if necessary.   Insurance Information YOU WILL NEED TO CHECK WITH YOUR INSURANCE COMPANY FOR THE BENEFITS OF COVERAGE YOU HAVE FOR THIS PROCEDURE.  UNFORTUNATELY, NOT ALL INSURANCE COMPANIES HAVE BENEFITS TO COVER ALL OR PART OF THESE TYPES OF PROCEDURES.  IT IS YOUR RESPONSIBILITY TO CHECK YOUR BENEFITS, HOWEVER, WE WILL BE GLAD TO ASSIST YOU WITH ANY CODES YOUR INSURANCE COMPANY MAY NEED.    PLEASE NOTE THAT MOST INSURANCE COMPANIES WILL NOT COVER A SCREENING COLONOSCOPY FOR PEOPLE UNDER THE AGE OF 50  IF YOU HAVE BCBS INSURANCE, YOU MAY HAVE BENEFITS FOR A SCREENING COLONOSCOPY BUT IF POLYPS ARE FOUND THE DIAGNOSIS WILL CHANGE AND THEN YOU MAY HAVE A DEDUCTIBLE THAT WILL NEED TO BE MET. SO PLEASE MAKE SURE YOU CHECK YOUR BENEFITS FOR A SCREENING COLONOSCOPY AS WELL AS A DIAGNOSTIC COLONOSCOPY.

## 2018-08-28 NOTE — Progress Notes (Signed)
Gastroenterology Pre-Procedure Review  Request Date:08/28/18 Requesting Physician: Dena Billet PA-C/Dr.[place] [facility]- no previous tcs  PATIENT REVIEW QUESTIONS: The patient responded to the following health history questions as indicated:    1. Diabetes Melitis: yes (on 2 insulins) 2. Joint replacements in the past 12 months: no 3. Major health problems in the past 3 months: no 4. Has an artificial valve or MVP: no 5. Has a defibrillator: no 6. Has been advised in past to take antibiotics in advance of a procedure like teeth cleaning: no 7. Family history of colon cancer: yes (brother age 26)  18. Alcohol Use: no 9. History of sleep apnea: no  10. History of coronary artery or other vascular stents placed within the last 12 months: no 11. History of any prior anesthesia complications: no    MEDICATIONS & ALLERGIES:    Patient reports the following regarding taking any blood thinners:   Plavix? no Aspirin? yes (324m) Coumadin? no Brilinta? no Xarelto? no Eliquis? no Pradaxa? no Savaysa? no Effient? no  Patient confirms/reports the following medications:  Current Outpatient Medications  Medication Sig Dispense Refill  . amLODipine (NORVASC) 10 MG tablet Take 1 tablet (10 mg total) by mouth daily. 90 tablet 1  . aspirin 325 MG tablet Take 1 tablet (325 mg total) by mouth daily. 30 tablet 12  . calcitRIOL (ROCALTROL) 0.5 MCG capsule Take 0.5 mcg by mouth daily.    . Cholecalciferol (VITAMIN D) 2000 units CAPS Take 1 capsule by mouth daily.    . cloNIDine (CATAPRES) 0.2 MG tablet Take 1 tablet (0.2 mg total) by mouth 2 (two) times daily. 180 tablet 3  . fish oil-omega-3 fatty acids 1000 MG capsule Take 1 g by mouth daily.     . insulin glargine (LANTUS) 100 UNIT/ML injection INJECT 20 UNITS SUBCUTANEOUSLY EVERY MORNING 1 vial 2  . insulin regular (NOVOLIN R RELION) 100 units/mL injection INJECT(0.04-0.06 MLS) 4-6 UNITS SUBCUTANEOUSLY THREE TIMES DAILY BEFORE MEAL(S) 10 mL 3  . iron  polysaccharides (NIFEREX) 150 MG capsule Take 150 mg by mouth. EVERY OTHER DAY    . labetalol (NORMODYNE) 100 MG tablet Take 1 tablet twice daily may take additional 100 mg for SBP < 160 270 tablet 1  . MAGNESIUM OXIDE PO Take 1 tablet by mouth daily.    . Melatonin 5 MG CAPS Take by mouth.    . Blood Glucose Monitoring Suppl (ACCU-CHEK NANO SMARTVIEW) W/DEVICE KIT     . glucose blood (ACCU-CHEK AVIVA) test strip Use as instructed to monitor FSBS 3x daily. Dx: E11.65. 100 each 12   No current facility-administered medications for this visit.     Patient confirms/reports the following allergies:  Allergies  Allergen Reactions  . Statins Other (See Comments)    Joint pain    No orders of the defined types were placed in this encounter.   AUTHORIZATION INFORMATION Primary Insurance: hJosephine Igo  IFlorida#: [id number]-Cert / AJosem Kaufmannrequired: no   SCHEDULE INFORMATION: Procedure has been scheduled as follows:  Date: 11/01/18, Time:1:00 Location: [facility] Dr.Fields  This Gastroenterology Pre-Precedure Review Form is being routed to the following provider(s): EWalden FieldNP

## 2018-08-29 NOTE — Progress Notes (Signed)
Ok to schedule.  DM meds: half night before and none morning of.  On prep day: Check CBG ac and hs as well as if the patient feels like their blood sugar is off. Can use soda, juice (that's in [place]) as needed for any low blood sugar.  Check CBG on arrival to endo unit.

## 2018-09-03 MED ORDER — PEG 3350-KCL-NA BICARB-NACL 420 G PO SOLR: 4000.0000 mL | ORAL | 0 refills | Status: DC

## 2018-09-03 NOTE — Addendum Note (Signed)
Addended by: Claudina Lick on: 09/03/2018 09:46 AM   Modules accepted: Orders

## 2018-09-03 NOTE — Progress Notes (Signed)
Pt could not afford suprep. rx for trilyte sent in and new instructions done with DM adjustments and mailed to the pt.

## 2018-09-03 NOTE — Progress Notes (Signed)
Pt is aware.  

## 2018-09-11 LAB — HM DIABETES EYE EXAM

## 2018-09-13 ENCOUNTER — Ambulatory Visit (HOSPITAL_COMMUNITY): Payer: Medicare PPO

## 2018-09-30 ENCOUNTER — Ambulatory Visit (HOSPITAL_COMMUNITY)
Admission: RE | Admit: 2018-09-30 | Discharge: 2018-09-30 | Disposition: A | Discharge status: Home / Self Care | Payer: Medicare PPO | Source: Ambulatory Visit | Attending: Physician Assistant | Admitting: Physician Assistant

## 2018-09-30 DIAGNOSIS — Z0000 Encounter for general adult medical examination without abnormal findings: Secondary | ICD-10-CM

## 2018-09-30 DIAGNOSIS — Z1231 Encounter for screening mammogram for malignant neoplasm of breast: Secondary | ICD-10-CM | POA: Diagnosis present

## 2018-10-01 ENCOUNTER — Other Ambulatory Visit: Payer: Self-pay | Admitting: Family Medicine

## 2018-10-02 ENCOUNTER — Encounter: Payer: Self-pay | Admitting: *Deleted

## 2018-10-08 ENCOUNTER — Ambulatory Visit: Payer: Medicare PPO | Admitting: Cardiology

## 2018-10-25 ENCOUNTER — Encounter: Payer: Self-pay | Admitting: Cardiology

## 2018-10-25 ENCOUNTER — Ambulatory Visit: Payer: Medicare PPO | Admitting: Cardiology

## 2018-10-25 VITALS — BP 134/71 | HR 76 | Ht 70.0 in | Wt 186.6 lb

## 2018-10-25 DIAGNOSIS — I10 Essential (primary) hypertension: Secondary | ICD-10-CM | POA: Diagnosis not present

## 2018-10-25 DIAGNOSIS — E782 Mixed hyperlipidemia: Secondary | ICD-10-CM

## 2018-10-25 NOTE — Patient Instructions (Signed)
Your physician recommends that you schedule a follow-up appointment in: AS NEEDED WITH DR BRANCH  Your physician recommends that you continue on your current medications as directed. Please refer to the Current Medication list given to you today.  Thank you for choosing [facility]!!    

## 2018-10-25 NOTE — Progress Notes (Signed)
Clinical Summary Amanda Krueger is a 72 y.o.female seen today for follow up of the following medical problems.  1. Hyperlipidemia - had been on pravstatin 3 times a week, did not tolerate due to muscle aches. - lipids Jan 2018 TC 180 TG 95 HDL 59 LDL 102 - allergy to zetia  - working on dietary modification. LDL has not been bad enough to consider pcsk9 inhibitor.   - 03/2018 TC 214 HDL 69 TG 76 LDL 128  2. HTN - worsening renal function on ACE and thiazide diuretics in the past per pcp notes - chest pain and leg cramps on hydralazine. Extreme fatigue on labetalol, lowered dose to 188m bidwhich is tolerable for her  - compliant with meds   3. CKD III -followed by Dr BErmalinda Barrios  4. History of CVA - secondary prevention with ASAfull dose per neurology. No ACE-I or statin as described above.      Past Medical History:  Diagnosis Date  . Diabetes mellitus   . Diabetes mellitus due to underlying condition with stage 3 chronic kidney disease, with long-term current use of insulin ([place] 08/15/2016  . DJD (degenerative joint disease)   . Hyperlipidemia   . Hypertension      Allergies  Allergen Reactions  . Statins Other (See Comments)    Joint pain     Current Outpatient Medications  Medication Sig Dispense Refill  . amLODipine (NORVASC) 10 MG tablet TAKE 1 TABLET EVERY DAY 90 tablet 1  . aspirin 325 MG tablet Take 1 tablet (325 mg total) by mouth daily. 30 tablet 12  . Blood Glucose Monitoring Suppl (ACCU-CHEK NANO SMARTVIEW) W/DEVICE KIT     . calcitRIOL (ROCALTROL) 0.5 MCG capsule Take 0.5 mcg by mouth daily.    . Cholecalciferol (VITAMIN D) 2000 units CAPS Take 1 capsule by mouth daily.    . cloNIDine (CATAPRES) 0.2 MG tablet Take 1 tablet (0.2 mg total) by mouth 2 (two) times daily. 180 tablet 3  . fish oil-omega-3 fatty acids 1000 MG capsule Take 1 g by mouth daily.     .Marland Kitchenglucose blood (ACCU-CHEK AVIVA) test strip Use as instructed to monitor  FSBS 3x daily. Dx: E11.65. 100 each 12  . insulin glargine (LANTUS) 100 UNIT/ML injection INJECT 20 UNITS SUBCUTANEOUSLY EVERY MORNING 1 vial 2  . insulin regular (NOVOLIN R RELION) 100 units/mL injection INJECT(0.04-0.06 MLS) 4-6 UNITS SUBCUTANEOUSLY THREE TIMES DAILY BEFORE MEAL(S) 10 mL 3  . iron polysaccharides (NIFEREX) 150 MG capsule Take 150 mg by mouth. EVERY OTHER DAY    . labetalol (NORMODYNE) 100 MG tablet Take 1 tablet twice daily may take additional 100 mg for SBP < 160 270 tablet 1  . MAGNESIUM OXIDE PO Take 1 tablet by mouth daily.    . Melatonin 5 MG CAPS Take by mouth.    . Na Sulfate-K Sulfate-Mg Sulf (SUPREP BOWEL PREP KIT) 17.5-3.13-1.6 GM/177ML SOLN Take 1 kit by mouth as directed. 1 Bottle 0  . polyethylene glycol-electrolytes (TRILYTE) 420 g solution Take 4,000 mLs by mouth as directed. 4000 mL 0   No current facility-administered medications for this visit.      Past Surgical History:  Procedure Laterality Date  .  rt toe debridement    . CATARACT EXTRACTION, BILATERAL     right eye-2007, left eye-2012  . EXAM UNDER ANESTHESIA WITH MANIPULATION OF KNEE Left 12/26/2013   Procedure: EXAM UNDER ANESTHESIA WITH MANIPULATION OF KNEE;  Surgeon: SCarole Civil MD;  [facility];  Service: Orthopedics;  Laterality: Left;  . KNEE ARTHROSCOPY Left 12/26/2013   Procedure: ARTHROSCOPY KNEE;  Surgeon: Carole Civil, MD;  Location: [facility];  Service: Orthopedics;  Laterality: Left;  . right toe     Great toe  . SYNOVECTOMY Left 12/26/2013   Procedure: EXTENSIVE SYNOVECTOMY LEFT KNEE;  Surgeon: Carole Civil, MD;  Location: [facility];  Service: Orthopedics;  Laterality: Left;  . WOUND DEBRIDEMENT Left 12/26/2013   Procedure: LEFT GREAT TOE CALLOUS DEBRIDEMENT;  Surgeon: Carole Civil, MD;  Location: [facility];  Service: Orthopedics;  Laterality: Left;     Allergies  Allergen Reactions  . Statins Other (See Comments)    Joint pain      Family History  Problem  Relation Age of Onset  . Diabetes Mother   . Stroke Father   . Heart disease Father   . Diabetes Sister   . Diabetes Brother   . Diabetes Sister      Social History Amanda Krueger reports that she has never smoked. She has never used smokeless tobacco. Amanda Krueger reports that she does not drink alcohol.   Review of Systems CONSTITUTIONAL: No weight loss, fever, chills, weakness or fatigue.  HEENT: Eyes: No visual loss, blurred vision, double vision or yellow sclerae.No hearing loss, sneezing, congestion, runny nose or sore throat.  SKIN: No rash or itching.  CARDIOVASCULAR: per hpi RESPIRATORY: No shortness of breath, cough or sputum.  GASTROINTESTINAL: No anorexia, nausea, vomiting or diarrhea. No abdominal pain or blood.  GENITOURINARY: No burning on urination, no polyuria NEUROLOGICAL: No headache, dizziness, syncope, paralysis, ataxia, numbness or tingling in the extremities. No change in bowel or bladder control.  MUSCULOSKELETAL: No muscle, back pain, joint pain or stiffness.  LYMPHATICS: No enlarged nodes. No history of splenectomy.  PSYCHIATRIC: No history of depression or anxiety.  ENDOCRINOLOGIC: No reports of sweating, cold or heat intolerance. No polyuria or polydipsia.  Marland Kitchen   Physical Examination Vitals:   10/25/18 1249  BP: 134/71  Pulse: 76   Vitals:   10/25/18 1249  Weight: 186 lb 9.6 oz (84.6 kg)  Height: '5\' 10"'  (1.778 m)    Gen: resting comfortably, no acute distress HEENT: no scleral icterus, pupils equal round and reactive, no palptable cervical adenopathy,  CV: RRR, no m/r/g, no jvd Resp: Clear to auscultation bilaterally GI: abdomen is soft, non-tender, non-distended, normal bowel sounds, no hepatosplenomegaly MSK: extremities are warm, no edema.  Skin: warm, no rash Neuro:  no focal deficits Psych: appropriate affect   Diagnostic Studies     Assessment and Plan  1. Hyperlipidemia - intolerant to statinsand zetia -she is not interested  in pcsk-9 inhibitor - continue dietary modification, if significant worsening of LDL readdress repatha.    2. HTN - medications limited due to poor renal functionand medication side effects - has been able to tolerate current regimen and is reasonably well controlled, no changes.     F/u as needed.       Arnoldo Lenis, M.D

## 2018-10-26 ENCOUNTER — Other Ambulatory Visit: Payer: Self-pay | Admitting: Family Medicine

## 2018-11-01 ENCOUNTER — Other Ambulatory Visit: Payer: Self-pay

## 2018-11-01 ENCOUNTER — Ambulatory Visit (HOSPITAL_COMMUNITY)
Admission: RE | Admit: 2018-11-01 | Discharge: 2018-11-01 | Disposition: A | Discharge status: Home / Self Care | Payer: Medicare PPO | Source: Ambulatory Visit | Attending: Gastroenterology | Admitting: Gastroenterology

## 2018-11-01 ENCOUNTER — Encounter (HOSPITAL_COMMUNITY): Payer: Self-pay | Admitting: *Deleted

## 2018-11-01 ENCOUNTER — Encounter (HOSPITAL_COMMUNITY)
Admission: RE | Disposition: A | Discharge status: Home / Self Care | Payer: Self-pay | Source: Ambulatory Visit | Attending: Gastroenterology

## 2018-11-01 DIAGNOSIS — Z8249 Family history of ischemic heart disease and other diseases of the circulatory system: Secondary | ICD-10-CM | POA: Diagnosis not present

## 2018-11-01 DIAGNOSIS — Z800 Family history of malignant neoplasm of digestive organs: Secondary | ICD-10-CM | POA: Insufficient documentation

## 2018-11-01 DIAGNOSIS — Z794 Long term (current) use of insulin: Secondary | ICD-10-CM | POA: Diagnosis not present

## 2018-11-01 DIAGNOSIS — Z7982 Long term (current) use of aspirin: Secondary | ICD-10-CM | POA: Insufficient documentation

## 2018-11-01 DIAGNOSIS — K644 Residual hemorrhoidal skin tags: Secondary | ICD-10-CM | POA: Diagnosis not present

## 2018-11-01 DIAGNOSIS — Z1211 Encounter for screening for malignant neoplasm of colon: Secondary | ICD-10-CM

## 2018-11-01 DIAGNOSIS — D125 Benign neoplasm of sigmoid colon: Secondary | ICD-10-CM | POA: Insufficient documentation

## 2018-11-01 DIAGNOSIS — E1122 Type 2 diabetes mellitus with diabetic chronic kidney disease: Secondary | ICD-10-CM | POA: Insufficient documentation

## 2018-11-01 DIAGNOSIS — E785 Hyperlipidemia, unspecified: Secondary | ICD-10-CM | POA: Diagnosis not present

## 2018-11-01 DIAGNOSIS — K648 Other hemorrhoids: Secondary | ICD-10-CM | POA: Diagnosis not present

## 2018-11-01 DIAGNOSIS — Z823 Family history of stroke: Secondary | ICD-10-CM | POA: Diagnosis not present

## 2018-11-01 DIAGNOSIS — I129 Hypertensive chronic kidney disease with stage 1 through stage 4 chronic kidney disease, or unspecified chronic kidney disease: Secondary | ICD-10-CM | POA: Diagnosis not present

## 2018-11-01 DIAGNOSIS — N183 Chronic kidney disease, stage 3 (moderate): Secondary | ICD-10-CM | POA: Insufficient documentation

## 2018-11-01 DIAGNOSIS — D123 Benign neoplasm of transverse colon: Secondary | ICD-10-CM | POA: Diagnosis not present

## 2018-11-01 DIAGNOSIS — D122 Benign neoplasm of ascending colon: Secondary | ICD-10-CM | POA: Diagnosis not present

## 2018-11-01 DIAGNOSIS — Z79899 Other long term (current) drug therapy: Secondary | ICD-10-CM | POA: Insufficient documentation

## 2018-11-01 DIAGNOSIS — Z888 Allergy status to other drugs, medicaments and biological substances status: Secondary | ICD-10-CM | POA: Insufficient documentation

## 2018-11-01 HISTORY — PX: POLYPECTOMY: SHX5525

## 2018-11-01 HISTORY — PX: COLONOSCOPY: SHX5424

## 2018-11-01 LAB — GLUCOSE, CAPILLARY: GLUCOSE-CAPILLARY: 130 mg/dL — AB (ref 70–99)

## 2018-11-01 SURGERY — COLONOSCOPY
Anesthesia: Moderate Sedation

## 2018-11-01 MED ORDER — MIDAZOLAM HCL 5 MG/5ML IJ SOLN
INTRAMUSCULAR | Status: AC
  Filled 2018-11-01: qty 5

## 2018-11-01 MED ORDER — SODIUM CHLORIDE 0.9 % IV SOLN
INTRAVENOUS | Status: DC
  Administered 2018-11-01: 12:00:00 via INTRAVENOUS

## 2018-11-01 MED ORDER — MEPERIDINE HCL 100 MG/ML IJ SOLN
INTRAMUSCULAR | Status: DC | PRN
  Administered 2018-11-01 (×2): 25 mg

## 2018-11-01 MED ORDER — MIDAZOLAM HCL 5 MG/5ML IJ SOLN
INTRAMUSCULAR | Status: DC | PRN
  Administered 2018-11-01: 2 mg via INTRAVENOUS
  Administered 2018-11-01: 1 mg via INTRAVENOUS
  Administered 2018-11-01: 2 mg via INTRAVENOUS

## 2018-11-01 MED ORDER — MEPERIDINE HCL 50 MG/ML IJ SOLN
INTRAMUSCULAR | Status: AC
  Filled 2018-11-01: qty 1

## 2018-11-01 NOTE — H&P (Addendum)
Primary Care Physician:  Alycia Rossetti, MD Primary Gastroenterologist:  Dr. Oneida Alar  Pre-Procedure History & Physical: HPI:  Amanda Krueger is a 72 y.o. female here for [place].  Past Medical History:  Diagnosis Date  . Diabetes mellitus   . Diabetes mellitus due to underlying condition with stage 3 chronic kidney disease, with long-term current use of insulin ([place]) 08/15/2016  . DJD (degenerative joint disease)   . Hyperlipidemia   . Hypertension     Past Surgical History:  Procedure Laterality Date  .  rt toe debridement    . CATARACT EXTRACTION, BILATERAL     right eye-2007, left eye-2012  . EXAM UNDER ANESTHESIA WITH MANIPULATION OF KNEE Left 12/26/2013   Procedure: EXAM UNDER ANESTHESIA WITH MANIPULATION OF KNEE;  Surgeon: Carole Civil, MD;  Location: [facility];  Service: Orthopedics;  Laterality: Left;  . KNEE ARTHROSCOPY Left 12/26/2013   Procedure: ARTHROSCOPY KNEE;  Surgeon: Carole Civil, MD;  Location: [facility];  Service: Orthopedics;  Laterality: Left;  . right toe     Great toe  . SYNOVECTOMY Left 12/26/2013   Procedure: EXTENSIVE SYNOVECTOMY LEFT KNEE;  Surgeon: Carole Civil, MD;  Location: [facility];  Service: Orthopedics;  Laterality: Left;  . WOUND DEBRIDEMENT Left 12/26/2013   Procedure: LEFT GREAT TOE CALLOUS DEBRIDEMENT;  Surgeon: Carole Civil, MD;  Location: [facility];  Service: Orthopedics;  Laterality: Left;    Prior to Admission medications   Medication Sig Start Date End Date Taking? Authorizing Provider  amLODipine (NORVASC) 10 MG tablet TAKE 1 TABLET EVERY DAY Patient taking differently: Take 10 mg by mouth daily.  10/01/18  Yes [place], Modena Nunnery, MD  aspirin 325 MG tablet Take 1 tablet (325 mg total) by mouth daily. 01/18/17  Yes Nita Sells, MD  calcitRIOL (ROCALTROL) 0.5 MCG capsule Take 0.5 mcg by mouth daily.   Yes [provider]  Cholecalciferol (VITAMIN D) 2000 units CAPS Take 2,000 Units by mouth daily.     Yes [provider]  cloNIDine (CATAPRES) 0.2 MG tablet Take 1 tablet (0.2 mg total) by mouth 2 (two) times daily. 03/15/18  Yes [place], Modena Nunnery, MD  fish oil-omega-3 fatty acids 1000 MG capsule Take 1 g by mouth 2 (two) times a week.    Yes [provider]  insulin glargine (LANTUS) 100 UNIT/ML injection Inject 0.2 mLs (20 Units total) into the skin every morning. INJECT 20 UNITS SUBCUTANEOUSLY EVERY MORNING 10/29/18  Yes [place], Modena Nunnery, MD  insulin regular (NOVOLIN R RELION) 100 units/mL injection INJECT(0.04-0.06 MLS) 4-6 UNITS SUBCUTANEOUSLY THREE TIMES DAILY BEFORE MEAL(S) 10/22/17  Yes [place], Modena Nunnery, MD  iron polysaccharides (NIFEREX) 150 MG capsule Take 150 mg by mouth every other day.    Yes [provider]  labetalol (NORMODYNE) 100 MG tablet Take 1 tablet twice daily may take additional 100 mg for SBP < 160 Patient taking differently: Take 100 mg by mouth 2 (two) times daily. Take 1 tablet twice daily may take additional 100 mg for SBP < 160 03/27/18  Yes [place], Alphonse Guild, MD  MAGNESIUM OXIDE PO Take 250 mg by mouth daily as needed (cramps).    Yes [provider]  Na Sulfate-K Sulfate-Mg Sulf (SUPREP BOWEL PREP KIT) 17.5-3.13-1.6 GM/177ML SOLN Take 1 kit by mouth as directed. 08/28/18  Yes Carlis Stable, NP  OVER THE COUNTER MEDICATION Apply 1 application topically daily as needed (pain). Hempvana Pain Cream   Yes [provider]  oxyCODONE-acetaminophen (PERCOCET/ROXICET) 5-325 MG tablet Take 1 tablet by mouth every 4 (four) hours as needed for severe pain.   Yes [provider]  tobramycin (TOBREX) 0.3 % ophthalmic solution Place 1 drop into both eyes 4 (four) times daily.   Yes [provider]  Blood Glucose Monitoring Suppl (ACCU-CHEK NANO SMARTVIEW) W/DEVICE KIT  12/05/13   [provider]  glucose blood (ACCU-CHEK AVIVA) test strip Use as instructed to monitor FSBS 3x daily. Dx: E11.65. 07/05/18   Alycia Rossetti, MD  polyethylene glycol-electrolytes (TRILYTE) 420 g solution Take 4,000 mLs by mouth as directed. 09/03/18   Carlis Stable, NP    Allergies as of 08/28/2018 - Review Complete 08/28/2018  Allergen Reaction Noted  . Statins Other (See Comments) 07/12/2015    Family History  Problem Relation Age of Onset  . Diabetes Mother   . Stroke Father   . Heart disease Father   . Diabetes Sister   . Diabetes Brother   . Diabetes Sister   . Colon cancer BROTHER AGE 63   . Colon polyps Neg Hx     Social History   Socioeconomic History  . Marital status: Divorced    Spouse name: Not on file  . Number of children: Not on file  . Years of education: Not on file  . Highest education level: Not on file  Occupational History  . Not on file  Social Needs  . Financial resource strain: Not on file  . Food insecurity:    Worry: Not on file    Inability: Not on file  . Transportation needs:    Medical: Not on file    Non-medical: Not on file  Tobacco Use  . Smoking status: Never Smoker  . Smokeless tobacco: Never Used  Substance and Sexual Activity  . Alcohol use: No  . Drug use: No  . Sexual activity: Not on file  Lifestyle  . Physical activity:    Days per week: Not on file    Minutes per session: Not on file  . Stress: Not on file  Relationships  . Social connections:    Talks on phone: Not on file    Gets together: Not on file    Attends religious service: Not on file    Active member of club or organization: Not on file    Attends meetings of clubs or organizations: Not on file    Relationship status: Not on file  . Intimate partner violence:    Fear of current or ex partner: Not on file    Emotionally abused: Not on file    Physically abused: Not on file    Forced sexual activity: Not on file  Other Topics Concern  . Not on file  Social History Narrative  . Not on file    Review of Systems: See HPI, otherwise negative ROS   Physical Exam: BP (!) 172/67    Pulse 85   Temp 98.4 F (36.9 C) (Oral)   Resp 15   SpO2 98%  General:   Alert,  pleasant and cooperative in NAD Head:  Normocephalic and atraumatic. Neck:  Supple; Lungs:  Clear throughout to auscultation.    Heart:  Regular rate and rhythm. Abdomen:  Soft, nontender and nondistended. Normal bowel sounds, without guarding, and without rebound.   Neurologic:  Alert and  oriented x4;  grossly normal neurologically.  Impression/Plan:    SCREENING  Plan:  1. TCS TODAY DISCUSSED PROCEDURE, BENEFITS, & RISKS: <  1% chance of medication reaction, bleeding, perforation, or rupture of spleen/liver.

## 2018-11-01 NOTE — Op Note (Signed)
[facility] Patient Name: Amanda Krueger Procedure Date: 11/01/2018 12:25 PM MRN: [id number] Date of Birth: [birth year] Attending MD: Barney Drain MD, MD CSN: [id number] Age: 72 Admit Type: Outpatient Procedure:                Colonoscopy WITH COLD SNARE POLYPECTOMY Indications:              Screening for colorectal malignant neoplasm Providers:                Barney Drain MD, MD, Janeece Riggers, RN, Randa Spike, Technician Referring MD:             Modena Nunnery. [place] Medicines:                Meperidine 50 mg IV, Midazolam 5 mg IV Complications:            No immediate complications. Estimated Blood Loss:     Estimated blood loss was minimal. Procedure:                Pre-Anesthesia Assessment:                           - Prior to the procedure, a History and Physical                            was performed, and patient medications and                            allergies were reviewed. The patient's tolerance of                            previous anesthesia was also reviewed. The risks                            and benefits of the procedure and the sedation                            options and risks were discussed with the patient.                            All questions were answered, and informed consent                            was obtained. Prior Anticoagulants: The patient has                            taken aspirin, last dose was day of procedure. ASA                            Grade Assessment: II - A patient with mild systemic                            disease. After reviewing the risks and benefits,  the patient was deemed in satisfactory condition to                            undergo the procedure. After obtaining informed                            consent, the colonoscope was passed under direct                            vision. Throughout the procedure, the patient's                            blood  pressure, pulse, and oxygen saturations were                            monitored continuously. The CF-[id number] ([id number])                            scope was introduced through the anus and advanced                            to the the cecum, identified by appendiceal orifice                            and ileocecal valve. The colonoscopy was somewhat                            difficult due to a tortuous colon. Successful                            completion of the procedure was aided by                            straightening and shortening the scope to obtain                            bowel loop reduction and COLOWRAP. The patient                            tolerated the procedure well. The quality of the                            bowel preparation was good. The ileocecal valve,                            appendiceal orifice, and rectum were photographed. Scope In: 1:21:36 PM Scope Out: 1:40:46 PM Scope Withdrawal Time: 0 hours 16 minutes 15 seconds  Total Procedure Duration: 0 hours 19 minutes 10 seconds  Findings:      Four sessile polyps were found in the sigmoid colon, transverse colon       and ascending colon. The polyps were 3 to 4 mm in size. These polyps       were removed with a cold snare. Polyp resection was incomplete, and the  resected tissue was partially retrieved.      The recto-sigmoid colon and sigmoid colon were mildly tortuous.      External and internal hemorrhoids were found. The hemorrhoids were       moderate. Impression:               - Four 3 to 4 mm polyps in the sigmoid colon, in                            the transverse colon(2) and in the ascending colon,                            removed with a cold snare. Polyp resection was                            incomplete, and the resected tissue was partially                            retrieved.                           - Tortuous colon.                           - External and internal  hemorrhoids. Moderate Sedation:      Moderate (conscious) sedation was administered by the endoscopy nurse       and supervised by the endoscopist. The following parameters were       monitored: oxygen saturation, heart rate, blood pressure, and response       to care. Total physician intraservice time was 32 minutes. Recommendation:           - Patient has a contact number available for                            emergencies. The signs and symptoms of potential                            delayed complications were discussed with the                            patient. Return to normal activities tomorrow.                            Written discharge instructions were provided to the                            patient.                           - High fiber diet.                           - Continue present medications.                           - Await pathology results.                           -  Repeat colonoscopy in 3 years for surveillance. Procedure Code(s):        --- Professional ---                           [phone], Colonoscopy, flexible; with removal of                            tumor(s), polyp(s), or other lesion(s) by snare                            technique                           99153, Moderate sedation; each additional 15                            minutes intraservice time                           G0500, Moderate sedation services provided by the                            same physician or other qualified health care                            professional performing a gastrointestinal                            endoscopic service that sedation supports,                            requiring the presence of an independent trained                            observer to assist in the monitoring of the                            patient's level of consciousness and physiological                            status; initial 15 minutes of intra-service time;                             patient age 45 years or older (additional time may                            be reported with [phone], as appropriate) Diagnosis Code(s):        --- Professional ---                           Z12.11, Encounter for screening for malignant                            neoplasm of colon  D12.5, Benign neoplasm of sigmoid colon                           D12.3, Benign neoplasm of transverse colon (hepatic                            flexure or splenic flexure)                           D12.2, Benign neoplasm of ascending colon                           K64.8, Other hemorrhoids                           Q43.8, Other specified congenital malformations of                            intestine CPT copyright 2018 [employer]. All rights reserved. The codes documented in this report are preliminary and upon coder review may  be revised to meet current compliance requirements. Barney Drain, MD Barney Drain MD, MD 11/01/2018 1:52:46 PM This report has been signed electronically. Number of Addenda: 0

## 2018-11-01 NOTE — Discharge Instructions (Signed)
You had 4 small polyp removed and three were RETRIEVED. You have  SMALL internal hemorrhoids.   DRINK WATER TO KEEP YOUR URINE LIGHT YELLOW.  FOLLOW A HIGH FIBER DIET. AVOID ITEMS THAT CAUSE BLOATING & GAS. SEE INFO BELOW.  YOUR BIOPSY RESULTS WILL BE BACK IN 5 BUSINESS DAYS.  Next colonoscopy in 3 years.    Colonoscopy Care After Read the instructions outlined below and refer to this sheet in the next week. These discharge instructions provide you with general information on caring for yourself after you leave the hospital. While your treatment has been planned according to the most current medical practices available, unavoidable complications occasionally occur. If you have any problems or questions after discharge, call DR. Daniel Ritthaler, [phone].  ACTIVITY  You may resume your regular activity, but move at a slower pace for the next 24 hours.   Take frequent rest periods for the next 24 hours.   Walking will help get rid of the air and reduce the bloated feeling in your belly (abdomen).   No driving for 24 hours (because of the medicine (anesthesia) used during the test).   You may shower.   Do not sign any important legal documents or operate any machinery for 24 hours (because of the anesthesia used during the test).    NUTRITION  Drink plenty of fluids.   You may resume your normal diet as instructed by your doctor.   Begin with a light meal and progress to your normal diet. Heavy or fried foods are harder to digest and may make you feel sick to your stomach (nauseated).   Avoid alcoholic beverages for 24 hours or as instructed.    MEDICATIONS  You may resume your normal medications.   WHAT YOU CAN EXPECT TODAY  Some feelings of bloating in the abdomen.   Passage of more gas than usual.   Spotting of blood in your stool or on the toilet paper  .  IF YOU HAD POLYPS REMOVED DURING THE COLONOSCOPY:  Eat a soft diet IF YOU HAVE NAUSEA, BLOATING, ABDOMINAL  PAIN, OR VOMITING.    FINDING OUT THE RESULTS OF YOUR TEST Not all test results are available during your visit. DR. Oneida Alar WILL CALL YOU WITHIN 14 DAYS OF YOUR PROCEDUE WITH YOUR RESULTS. Do not assume everything is normal if you have not heard from DR. Yared Barefoot, CALL HER OFFICE AT [phone].  SEEK IMMEDIATE MEDICAL ATTENTION AND CALL THE OFFICE: [phone] IF:  You have more than a spotting of blood in your stool.   Your belly is swollen (abdominal distention).   You are nauseated or vomiting.   You have a temperature over 101F.   You have abdominal pain or discomfort that is severe or gets worse throughout the day.   High-Fiber Diet A high-fiber diet changes your normal diet to include more whole grains, legumes, fruits, and vegetables. Changes in the diet involve replacing refined carbohydrates with unrefined foods. The calorie level of the diet is essentially unchanged. The Dietary Reference Intake (recommended amount) for adult males is 38 grams per day. For adult females, it is 25 grams per day. Pregnant and lactating women should consume 28 grams of fiber per day. Fiber is the intact part of a plant that is not broken down during digestion. Functional fiber is fiber that has been isolated from the plant to provide a beneficial effect in the body. PURPOSE  Increase stool bulk.   Ease and regulate bowel movements.   Lower cholesterol.  REDUCE RISK OF COLON CANCER  INDICATIONS THAT YOU NEED MORE FIBER  Constipation and hemorrhoids.   Uncomplicated diverticulosis (intestine condition) and irritable bowel syndrome.   Weight management.   As a protective measure against hardening of the arteries (atherosclerosis), diabetes, and cancer.   GUIDELINES FOR INCREASING FIBER IN THE DIET  Start adding fiber to the diet slowly. A gradual increase of about 5 more grams (2 slices of whole-wheat bread, 2 servings of most fruits or vegetables, or 1 bowl of high-fiber cereal) per  day is best. Too rapid an increase in fiber may result in constipation, flatulence, and bloating.   Drink enough water and fluids to keep your urine clear or pale yellow. Water, juice, or caffeine-free drinks are recommended. Not drinking enough fluid may cause constipation.   Eat a variety of high-fiber foods rather than one type of fiber.   Try to increase your intake of fiber through using high-fiber foods rather than fiber pills or supplements that contain small amounts of fiber.   The goal is to change the types of food eaten. Do not supplement your present diet with high-fiber foods, but replace foods in your present diet.   INCLUDE A VARIETY OF FIBER SOURCES  Replace refined and processed grains with whole grains, canned fruits with fresh fruits, and incorporate other fiber sources. White rice, white breads, and most bakery goods contain little or no fiber.   Brown whole-grain rice, buckwheat oats, and many fruits and vegetables are all good sources of fiber. These include: broccoli, Brussels sprouts, cabbage, cauliflower, beets, sweet potatoes, white potatoes (skin on), carrots, tomatoes, eggplant, squash, berries, fresh fruits, and dried fruits.   Cereals appear to be the richest source of fiber. Cereal fiber is found in whole grains and bran. Bran is the fiber-rich outer coat of cereal grain, which is largely removed in refining. In whole-grain cereals, the bran remains. In breakfast cereals, the largest amount of fiber is found in those with "bran" in their names. The fiber content is sometimes indicated on the label.   You may need to include additional fruits and vegetables each day.   In baking, for 1 cup white flour, you may use the following substitutions:   1 cup whole-wheat flour minus 2 tablespoons.   1/2 cup white flour plus 1/2 cup whole-wheat flour.   Polyps, Colon  A polyp is extra tissue that grows inside your body. Colon polyps grow in the large intestine. The  large intestine, also called the colon, is part of your digestive system. It is a long, hollow tube at the end of your digestive tract where your body makes and stores stool. Most polyps are not dangerous. They are benign. This means they are not cancerous. But over time, some types of polyps can turn into cancer. Polyps that are smaller than a pea are usually not harmful. But larger polyps could someday become or may already be cancerous. To be safe, doctors remove all polyps and test them.   WHO GETS POLYPS? Anyone can get polyps, but certain people are more likely than others. You may have a greater chance of getting polyps if:  You are over 50.   You have had polyps before.   Someone in your family has had polyps.   Someone in your family has had cancer of the large intestine.   Find out if someone in your family has had polyps. You may also be more likely to get polyps if you:   Eat a  lot of fatty foods   Smoke   Drink alcohol   Do not exercise  Eat too much   PREVENTION There is not one sure way to prevent polyps. You might be able to lower your risk of getting them if you:  Eat more fruits and vegetables and less fatty food.   Do not smoke.   Avoid alcohol.   Exercise every day.   Lose weight if you are overweight.   Eating more calcium and folate can also lower your risk of getting polyps. Some foods that are rich in calcium are milk, cheese, and broccoli. Some foods that are rich in folate are chickpeas, kidney beans, and spinach.

## 2018-11-07 ENCOUNTER — Encounter (HOSPITAL_COMMUNITY): Payer: Self-pay | Admitting: Gastroenterology

## 2018-11-07 ENCOUNTER — Telehealth: Payer: Self-pay | Admitting: Gastroenterology

## 2018-11-07 NOTE — Telephone Encounter (Signed)
Reminder in epic °

## 2018-11-07 NOTE — Telephone Encounter (Signed)
Called and could not leave message. Mailbox is full.

## 2018-11-07 NOTE — Telephone Encounter (Signed)
PLEASE CALL PT. THREE SIMPLE ADENOMAS REMOVED.   DRINK WATER TO KEEP YOUR URINE LIGHT YELLOW. FOLLOW A HIGH FIBER DIET. AVOID ITEMS THAT CAUSE BLOATING & GAS.  Next colonoscopy in 3 years.

## 2018-11-07 NOTE — Telephone Encounter (Signed)
PT is aware.

## 2018-12-27 DIAGNOSIS — H35371 Puckering of macula, right eye: Secondary | ICD-10-CM | POA: Diagnosis not present

## 2018-12-27 DIAGNOSIS — E113512 Type 2 diabetes mellitus with proliferative diabetic retinopathy with macular edema, left eye: Secondary | ICD-10-CM | POA: Diagnosis not present

## 2018-12-27 DIAGNOSIS — E113411 Type 2 diabetes mellitus with severe nonproliferative diabetic retinopathy with macular edema, right eye: Secondary | ICD-10-CM | POA: Diagnosis not present

## 2018-12-27 DIAGNOSIS — H4312 Vitreous hemorrhage, left eye: Secondary | ICD-10-CM | POA: Diagnosis not present

## 2019-01-02 DIAGNOSIS — E113512 Type 2 diabetes mellitus with proliferative diabetic retinopathy with macular edema, left eye: Secondary | ICD-10-CM | POA: Diagnosis not present

## 2019-01-02 DIAGNOSIS — H4312 Vitreous hemorrhage, left eye: Secondary | ICD-10-CM | POA: Diagnosis not present

## 2019-01-03 ENCOUNTER — Other Ambulatory Visit: Payer: Self-pay | Admitting: *Deleted

## 2019-01-03 DIAGNOSIS — H4312 Vitreous hemorrhage, left eye: Secondary | ICD-10-CM | POA: Diagnosis not present

## 2019-01-03 DIAGNOSIS — E113512 Type 2 diabetes mellitus with proliferative diabetic retinopathy with macular edema, left eye: Secondary | ICD-10-CM | POA: Diagnosis not present

## 2019-01-03 MED ORDER — OXYCODONE-ACETAMINOPHEN 5-325 MG PO TABS: 1.0000 | ORAL_TABLET | ORAL | 0 refills | Status: DC | PRN

## 2019-01-03 NOTE — Telephone Encounter (Signed)
Received call from patient.   Requested refill on Oxycodone/APAP.   Ok to refill??  Last office visit 08/08/2018.  Last refill 10/28/2018.

## 2019-01-10 DIAGNOSIS — E113512 Type 2 diabetes mellitus with proliferative diabetic retinopathy with macular edema, left eye: Secondary | ICD-10-CM | POA: Diagnosis not present

## 2019-01-10 DIAGNOSIS — E113411 Type 2 diabetes mellitus with severe nonproliferative diabetic retinopathy with macular edema, right eye: Secondary | ICD-10-CM | POA: Diagnosis not present

## 2019-02-07 DIAGNOSIS — H4311 Vitreous hemorrhage, right eye: Secondary | ICD-10-CM | POA: Diagnosis not present

## 2019-02-07 DIAGNOSIS — E113513 Type 2 diabetes mellitus with proliferative diabetic retinopathy with macular edema, bilateral: Secondary | ICD-10-CM | POA: Diagnosis not present

## 2019-02-12 ENCOUNTER — Ambulatory Visit: Payer: Medicare HMO | Admitting: Podiatrist

## 2019-02-12 ENCOUNTER — Encounter: Payer: Self-pay | Admitting: Podiatrist

## 2019-02-12 DIAGNOSIS — E0843 Diabetes mellitus due to underlying condition with diabetic autonomic (poly)neuropathy: Secondary | ICD-10-CM | POA: Diagnosis not present

## 2019-02-12 DIAGNOSIS — L84 Corns and callosities: Secondary | ICD-10-CM

## 2019-02-12 DIAGNOSIS — B351 Tinea unguium: Secondary | ICD-10-CM

## 2019-02-12 DIAGNOSIS — E1169 Type 2 diabetes mellitus with other specified complication: Secondary | ICD-10-CM | POA: Diagnosis not present

## 2019-02-12 DIAGNOSIS — E1142 Type 2 diabetes mellitus with diabetic polyneuropathy: Secondary | ICD-10-CM

## 2019-02-12 NOTE — Progress Notes (Signed)
   Chief Complaint  Patient presents with  . Callouses    Left plantar callous  . Nail Problem    Nail trim 1-5 bilat     HPI: Patient is 73 y.o. female who presents today for calluses and nail tril.  She denies any systemic signs of infection.  Relates some pain on the left foot at the area of the large callus.  Also states she has some diabetic shoes but she does not wear them due to their heavy weight.  She presents in sneakers today.    Allergies  Allergen Reactions  . Statins Other (See Comments)    Joint pain    Review of systems is reviewed and negative.   Physical Exam  Patient is awake, alert, and oriented x 3.  In no acute distress.    Vascular status is intact with palpable pedal pulses DP and PT bilateral and capillary refill time less than 3 seconds bilateral.  No edema or erythema noted.  Neurological exam reveals epicritic and protective sensation decreased bilateral.  Dermatological exam reveals skin is supple and dry to bilateral feet.  Plantar callus present submet 5 left with intact skin post debridement.  Right hallux also has a large callus overlying the distal tip with intact skin post debridement as well  Nails 1-5 left and 2-5 right are long, thick, discolored, friable, fragile and mycotic.  Musculoskeletal exam: hammertoe deformity present bilateral digits- shortened first digit present post surgery on the digit.  Prominent metatarsal head left 5th noted  Assessment: Diabetes with neuropathy Prominent plantarflexed metatarsal with callus left submet 5 Callus right hallux Symptomatic mycotic toenails  Plan: Debridement of calluses carried out today and no underlying ulcerations were encountered.  Nails also debrided. Recommended diabetic shoes and inserts as a preventative for future ulcerations.

## 2019-02-12 NOTE — Patient Instructions (Signed)
Diabetes Mellitus and Foot Care  Foot care is an important part of your health, especially when you have diabetes. Diabetes may cause you to have problems because of poor blood flow (circulation) to your feet and legs, which can cause your skin to:   Become thinner and drier.   Break more easily.   Heal more slowly.   Peel and crack.  You may also have nerve damage (neuropathy) in your legs and feet, causing decreased feeling in them. This means that you may not notice minor injuries to your feet that could lead to more serious problems. Noticing and addressing any potential problems early is the best way to prevent future foot problems.  How to care for your feet  Foot hygiene   Wash your feet daily with warm water and mild soap. Do not use hot water. Then, pat your feet and the areas between your toes until they are completely dry. Do not soak your feet as this can dry your skin.   Trim your toenails straight across. Do not dig under them or around the cuticle. File the edges of your nails with an emery board or nail file.   Apply a moisturizing lotion or petroleum jelly to the skin on your feet and to dry, brittle toenails. Use lotion that does not contain alcohol and is unscented. Do not apply lotion between your toes.  Shoes and socks   Wear clean socks or stockings every day. Make sure they are not too tight. Do not wear knee-high stockings since they may decrease blood flow to your legs.   Wear shoes that fit properly and have enough cushioning. Always look in your shoes before you put them on to be sure there are no objects inside.   To break in new shoes, wear them for just a few hours a day. This prevents injuries on your feet.  Wounds, scrapes, corns, and calluses   Check your feet daily for blisters, cuts, bruises, sores, and redness. If you cannot see the bottom of your feet, use a mirror or ask someone for help.   Do not cut corns or calluses or try to remove them with medicine.   If you  find a minor scrape, cut, or break in the skin on your feet, keep it and the skin around it clean and dry. You may clean these areas with mild soap and water. Do not clean the area with peroxide, alcohol, or iodine.   If you have a wound, scrape, corn, or callus on your foot, look at it several times a day to make sure it is healing and not infected. Check for:  ? Redness, swelling, or pain.  ? Fluid or blood.  ? Warmth.  ? Pus or a bad smell.  General instructions   Do not cross your legs. This may decrease blood flow to your feet.   Do not use heating pads or hot water bottles on your feet. They may burn your skin. If you have lost feeling in your feet or legs, you may not know this is happening until it is too late.   Protect your feet from hot and cold by wearing shoes, such as at the beach or on hot pavement.   Schedule a complete foot exam at least once a year (annually) or more often if you have foot problems. If you have foot problems, report any cuts, sores, or bruises to your health care provider immediately.  Contact a health care provider if:     You have a medical condition that increases your risk of infection and you have any cuts, sores, or bruises on your feet.   You have an injury that is not healing.   You have redness on your legs or feet.   You feel burning or tingling in your legs or feet.   You have pain or cramps in your legs and feet.   Your legs or feet are numb.   Your feet always feel cold.   You have pain around a toenail.  Get help right away if:   You have a wound, scrape, corn, or callus on your foot and:  ? You have pain, swelling, or redness that gets worse.  ? You have fluid or blood coming from the wound, scrape, corn, or callus.  ? Your wound, scrape, corn, or callus feels warm to the touch.  ? You have pus or a bad smell coming from the wound, scrape, corn, or callus.  ? You have a fever.  ? You have a red line going up your leg.  Summary   Check your feet every day  for cuts, sores, red spots, swelling, and blisters.   Moisturize feet and legs daily.   Wear shoes that fit properly and have enough cushioning.   If you have foot problems, report any cuts, sores, or bruises to your health care provider immediately.   Schedule a complete foot exam at least once a year (annually) or more often if you have foot problems.  This information is not intended to replace advice given to you by your health care provider. Make sure you discuss any questions you have with your health care provider.  Document Released: 12/01/2000 Document Revised: 01/16/2018 Document Reviewed: 01/05/2017  [employer]vier Interactive Patient Education  2019 [employer]

## 2019-02-15 DIAGNOSIS — I10 Essential (primary) hypertension: Secondary | ICD-10-CM | POA: Diagnosis not present

## 2019-02-15 DIAGNOSIS — Z79899 Other long term (current) drug therapy: Secondary | ICD-10-CM | POA: Diagnosis not present

## 2019-02-15 DIAGNOSIS — R809 Proteinuria, unspecified: Secondary | ICD-10-CM | POA: Diagnosis not present

## 2019-02-15 DIAGNOSIS — E559 Vitamin D deficiency, unspecified: Secondary | ICD-10-CM | POA: Diagnosis not present

## 2019-02-15 DIAGNOSIS — Z1159 Encounter for screening for other viral diseases: Secondary | ICD-10-CM | POA: Diagnosis not present

## 2019-02-15 DIAGNOSIS — D509 Iron deficiency anemia, unspecified: Secondary | ICD-10-CM | POA: Diagnosis not present

## 2019-02-15 DIAGNOSIS — N183 Chronic kidney disease, stage 3 (moderate): Secondary | ICD-10-CM | POA: Diagnosis not present

## 2019-02-18 ENCOUNTER — Other Ambulatory Visit: Payer: Self-pay | Admitting: Family Medicine

## 2019-02-18 DIAGNOSIS — R809 Proteinuria, unspecified: Secondary | ICD-10-CM | POA: Diagnosis not present

## 2019-02-18 DIAGNOSIS — N184 Chronic kidney disease, stage 4 (severe): Secondary | ICD-10-CM | POA: Diagnosis not present

## 2019-02-18 DIAGNOSIS — I10 Essential (primary) hypertension: Secondary | ICD-10-CM | POA: Diagnosis not present

## 2019-02-18 DIAGNOSIS — E8889 Other specified metabolic disorders: Secondary | ICD-10-CM | POA: Diagnosis not present

## 2019-02-20 ENCOUNTER — Other Ambulatory Visit: Payer: Self-pay | Admitting: Cardiology

## 2019-02-21 ENCOUNTER — Other Ambulatory Visit: Payer: Self-pay | Admitting: Internal Medicine

## 2019-02-24 DIAGNOSIS — E113511 Type 2 diabetes mellitus with proliferative diabetic retinopathy with macular edema, right eye: Secondary | ICD-10-CM | POA: Diagnosis not present

## 2019-02-24 DIAGNOSIS — H4311 Vitreous hemorrhage, right eye: Secondary | ICD-10-CM | POA: Diagnosis not present

## 2019-02-24 DIAGNOSIS — H33321 Round hole, right eye: Secondary | ICD-10-CM | POA: Diagnosis not present

## 2019-02-24 DIAGNOSIS — H26491 Other secondary cataract, right eye: Secondary | ICD-10-CM | POA: Diagnosis not present

## 2019-02-24 DIAGNOSIS — H35371 Puckering of macula, right eye: Secondary | ICD-10-CM | POA: Diagnosis not present

## 2019-02-25 DIAGNOSIS — H4311 Vitreous hemorrhage, right eye: Secondary | ICD-10-CM | POA: Diagnosis not present

## 2019-02-25 DIAGNOSIS — E113411 Type 2 diabetes mellitus with severe nonproliferative diabetic retinopathy with macular edema, right eye: Secondary | ICD-10-CM | POA: Diagnosis not present

## 2019-03-20 ENCOUNTER — Other Ambulatory Visit: Payer: Self-pay | Admitting: Family Medicine

## 2019-05-06 DIAGNOSIS — E113513 Type 2 diabetes mellitus with proliferative diabetic retinopathy with macular edema, bilateral: Secondary | ICD-10-CM | POA: Diagnosis not present

## 2019-05-14 ENCOUNTER — Ambulatory Visit: Payer: Medicare HMO | Admitting: Podiatrist

## 2019-05-16 ENCOUNTER — Ambulatory Visit: Payer: Medicare HMO | Admitting: Podiatry

## 2019-05-16 ENCOUNTER — Encounter: Payer: Self-pay | Admitting: Podiatry

## 2019-05-16 ENCOUNTER — Other Ambulatory Visit: Payer: Self-pay

## 2019-05-16 VITALS — Temp 98.2°F

## 2019-05-16 DIAGNOSIS — B351 Tinea unguium: Secondary | ICD-10-CM

## 2019-05-16 DIAGNOSIS — L97519 Non-pressure chronic ulcer of other part of right foot with unspecified severity: Secondary | ICD-10-CM | POA: Diagnosis not present

## 2019-05-16 DIAGNOSIS — M79676 Pain in unspecified toe(s): Secondary | ICD-10-CM | POA: Diagnosis not present

## 2019-05-16 DIAGNOSIS — L02611 Cutaneous abscess of right foot: Secondary | ICD-10-CM

## 2019-05-16 DIAGNOSIS — E1149 Type 2 diabetes mellitus with other diabetic neurological complication: Secondary | ICD-10-CM

## 2019-05-16 DIAGNOSIS — Q828 Other specified congenital malformations of skin: Secondary | ICD-10-CM

## 2019-05-16 MED ORDER — MUPIROCIN 2 % EX OINT: 1.0000 "application " | TOPICAL_OINTMENT | Freq: Two times a day (BID) | CUTANEOUS | 2 refills | Status: DC

## 2019-05-16 MED ORDER — DOXYCYCLINE HYCLATE 100 MG PO TABS: 100.0000 mg | ORAL_TABLET | Freq: Two times a day (BID) | ORAL | 0 refills | Status: DC

## 2019-05-18 NOTE — Progress Notes (Signed)
Subjective: 73 y.o. returns the office today for painful, elongated, thickened toenails which she cannot trim herself. Denies any redness or drainage around the nails.  She also reports very thick calluses to both of her big toes and she has noticed a blister formed on the right big toe.  She denies any drainage or pus or any increase in swelling or redness.  Denies any acute changes since last appointment and no new complaints today. Denies any systemic complaints such as fevers, chills, nausea, vomiting.   PCP: Alycia Rossetti, MD   Objective: AAO 3, NAD DP/PT pulses palpable, CRT less than 3 seconds Protective sensation decreased with Simms Weinstein monofilament Nails hypertrophic, dystrophic, elongated, brittle, discolored 10. There is tenderness overlying the nails 1-5 bilaterally. There is no surrounding erythema or drainage along the nail sites. Thick hyperkeratotic lesions present bilateral hallux.  The left side no underlying ulceration drainage or signs of infection however on the right side there was an adjacent blister around the callus.  Upon debridement there is a superficial granular wound present a small amount of purulence was identified that was cultured.  Mild edema.  There is no erythema or ascending cellulitis. No open lesions or pre-ulcerative lesions are identified. No other areas of tenderness bilateral lower extremities. No overlying edema, erythema, increased warmth. No pain with calf compression, swelling, warmth, erythema.  Assessment: Patient presents with symptomatic onychomycosis, left hallux hyperkeratotic lesion, right hallux wound, abscess  Plan: -Treatment options including alternatives, risks, complications were discussed -Nails sharply debrided x 10 without complication/bleeding. -Discussed daily foot inspection. If there are any changes, to call the office immediately.  -Debrided the hyperkeratotic lesion left foot without any complications or  bleeding -Debrided the callus to reveal underlying wound on the right foot as well as the blister.  Unable to drain this and there was purulence identified and this was cultured.  I debrided without healthy, granular tissue utilizing #312 with scalpel without any complications.  Recommend antibiotic ointment dressing changes daily by prescribing parasite.  Prescribed doxycycline as well.  She has a surgical shoe at home that I want her to wear. -Monitor for any clinical signs or symptoms of infection and directed to call the office immediately should any occur or go to the ER.  Return in about 1 week (around 05/23/2019).  Celesta Gentile, DPM

## 2019-05-19 LAB — WOUND CULTURE
MICRO NUMBER:: 518985
RESULT:: NO GROWTH
SPECIMEN QUALITY:: ADEQUATE

## 2019-05-23 ENCOUNTER — Ambulatory Visit: Payer: Medicare HMO | Admitting: Podiatry

## 2019-05-23 ENCOUNTER — Other Ambulatory Visit: Payer: Self-pay

## 2019-05-23 ENCOUNTER — Encounter: Payer: Self-pay | Admitting: Podiatry

## 2019-05-23 VITALS — Temp 98.1°F

## 2019-05-23 DIAGNOSIS — L02611 Cutaneous abscess of right foot: Secondary | ICD-10-CM | POA: Diagnosis not present

## 2019-05-23 DIAGNOSIS — L97519 Non-pressure chronic ulcer of other part of right foot with unspecified severity: Secondary | ICD-10-CM | POA: Diagnosis not present

## 2019-05-23 NOTE — Progress Notes (Signed)
Subjective: 72 year old female presents the office today for follow-up evaluation of pain to her right big toe.  She says the wound is doing much better.  She is soaking the antibiotic ointment on twice a day.  She is on antibiotics.  She says it stopped draining.  She is not seeing any pus or any increase in swelling or redness or any red streaks. Denies any systemic complaints such as fevers, chills, nausea, vomiting. No acute changes since last appointment, and no other complaints at this time.   Objective: AAO x3, NAD DP/PT pulses palpable bilaterally, CRT less than 3 seconds Ulceration continues to the distal aspect of the right hallux with a granular wound base.  Segment measures 1 x 0.8 cm.  There is no probing to bone, amount or tunneling.  Hyperkeratotic periwound. There is mild hyper granulation tissue present along the wound.  The area of the previous blister has dried.  No ascending cellulitis.  No other open lesions identified at this time.  Hallux malleus is present. No open lesions or pre-ulcerative lesions.  No pain with calf compression, swelling, warmth, erythema  Assessment: Right hallux ulceration   Plan: -All treatment options discussed with the patient including all alternatives, risks, complications.  -Reviewed the wound culture with her.  Finish course of antibiotics.  Continue antibiotic ointment dressing changes daily.  Continue offloading at all times.  I did apply some silver nitrate to the wound today after debrided with the last #3 complete scalpel down to healthy, bleeding, granular tissue due to the mild hyper granulation tissue. -Monitor for any clinical signs or symptoms of infection and directed to call the office immediately should any occur or go to the ER. -Patient encouraged to call the office with any questions, concerns, change in symptoms.   Return in about 2 weeks (around 06/06/2019).  Trula Slade DPM

## 2019-06-03 DIAGNOSIS — I10 Essential (primary) hypertension: Secondary | ICD-10-CM | POA: Diagnosis not present

## 2019-06-03 DIAGNOSIS — E119 Type 2 diabetes mellitus without complications: Secondary | ICD-10-CM | POA: Diagnosis not present

## 2019-06-03 DIAGNOSIS — Z794 Long term (current) use of insulin: Secondary | ICD-10-CM | POA: Diagnosis not present

## 2019-06-03 DIAGNOSIS — E785 Hyperlipidemia, unspecified: Secondary | ICD-10-CM | POA: Diagnosis not present

## 2019-06-06 ENCOUNTER — Ambulatory Visit: Payer: Medicare HMO | Admitting: Podiatry

## 2019-06-09 ENCOUNTER — Other Ambulatory Visit: Payer: Self-pay

## 2019-06-09 ENCOUNTER — Ambulatory Visit: Payer: Medicare HMO | Admitting: Podiatry

## 2019-06-09 VITALS — Temp 97.6°F

## 2019-06-09 DIAGNOSIS — E1149 Type 2 diabetes mellitus with other diabetic neurological complication: Secondary | ICD-10-CM

## 2019-06-09 DIAGNOSIS — L97519 Non-pressure chronic ulcer of other part of right foot with unspecified severity: Secondary | ICD-10-CM | POA: Diagnosis not present

## 2019-06-11 NOTE — Progress Notes (Signed)
Subjective: 73 year old female presents the office today for follow-up evaluation of pain to her right big toe.  She states that the toe is been doing well and she is actually not kept a bandage on the toe.  She states that her sister located yesterday was pressing on her native bleed some but other than that she said no drainage pus coming from the area no swelling. Denies any systemic complaints such as fevers, chills, nausea, vomiting. No acute changes since last appointment, and no other complaints at this time.   Objective: AAO x3, NAD DP/PT pulses palpable bilaterally, CRT less than 3 seconds Ulceration continues to the distal aspect of the right hallux with a granular wound base.  Segment measures 0.6 x 0.6 cm cm.  There is no probing to bone, amount or tunneling.  Hyperkeratotic periwound. There is mild hyper granulation tissue present along the wound.  The area of the previous blister has dried.  I was able to debride this today there is new, healthy skin underneath the area.  No ascending cellulitis.  No other open lesions identified at this time.  Hallux malleus is present.  Hallux malleus is present. No open lesions or pre-ulcerative lesions.  No pain with calf compression, swelling, warmth, erythema  Assessment: Right hallux ulceration with improvement  Plan: -All treatment options discussed with the patient including all alternatives, risks, complications.  -The wound was sharply debridedto healthy, granular procedure of all nonviable tissue was #312 with scalpel no any complications. -I do very daily dressing changes antibiotic ointment.  I do not want her to keep the area open. -Monitor for any clinical signs or symptoms of infection and directed to call the office immediately should any occur or go to the ER. -Patient encouraged to call the office with any questions, concerns, change in symptoms.   Return in about 10 days (around 06/19/2019).  Trula Slade DPM

## 2019-06-19 ENCOUNTER — Ambulatory Visit: Payer: Medicare HMO | Admitting: Podiatry

## 2019-07-19 ENCOUNTER — Other Ambulatory Visit: Payer: Self-pay | Admitting: Family Medicine

## 2019-07-24 ENCOUNTER — Other Ambulatory Visit: Payer: Self-pay

## 2019-07-24 ENCOUNTER — Ambulatory Visit: Payer: Medicare HMO | Admitting: Podiatry

## 2019-07-24 DIAGNOSIS — E1149 Type 2 diabetes mellitus with other diabetic neurological complication: Secondary | ICD-10-CM

## 2019-07-24 DIAGNOSIS — L97519 Non-pressure chronic ulcer of other part of right foot with unspecified severity: Secondary | ICD-10-CM | POA: Diagnosis not present

## 2019-07-24 DIAGNOSIS — L02611 Cutaneous abscess of right foot: Secondary | ICD-10-CM | POA: Diagnosis not present

## 2019-07-24 MED ORDER — DOXYCYCLINE HYCLATE 100 MG PO TABS: 100.0000 mg | ORAL_TABLET | Freq: Two times a day (BID) | ORAL | 0 refills | Status: DC

## 2019-08-04 NOTE — Progress Notes (Signed)
Subjective: 73 year old female presents the office today for follow-up evaluation and ulcer to her right big toe.  She feels that she is got some moisture on the toe that may been draining.  She has been soaking her feet.  She said that she wore some compression socks but I have started the moisture.  Says she feels nauseous as she just recently took pain medicine so typically occur.  She has no other symptoms today including fevers, chills or active vomiting.  Objective: AAO x3, NAD DP/PT pulses palpable bilaterally, CRT less than 3 seconds Ulceration continues at the distal aspect of the right hallux.  There is macerated tissue with some hyperkeratotic tissue on the area.  Is able to debride this today.  There is no probing to bone, undermining or tunneling.  The wound does appear to be smaller than last appointment however does continue.  There is no fluctuation or crepitation. Slight malodor.       Assessment: Right hallux ulceration   Plan: -All treatment options discussed with the patient including all alternatives, risks, complications.  -The wound was sharply debrided down to healthy, granular procedure of all nonviable tissue was #312 with scalpel  -Recommend repair silver dressing changes daily.  Prescribed doxycycline.  Offloading at all times.  Would hold off on soaking the foot. -Monitor for any clinical signs or symptoms of infection and directed to call the office immediately should any occur or go to the ER. -Patient encouraged to call the office with any questions, concerns, change in symptoms.   Return in about 2 weeks (around 08/07/2019).  Trula Slade DPM

## 2019-08-08 ENCOUNTER — Ambulatory Visit: Payer: Medicare HMO | Admitting: Podiatry

## 2019-08-18 ENCOUNTER — Other Ambulatory Visit: Payer: Self-pay

## 2019-08-18 ENCOUNTER — Ambulatory Visit: Payer: Medicare HMO | Admitting: Podiatry

## 2019-08-18 VITALS — Temp 97.9°F

## 2019-08-18 DIAGNOSIS — L97519 Non-pressure chronic ulcer of other part of right foot with unspecified severity: Secondary | ICD-10-CM

## 2019-08-18 DIAGNOSIS — E1149 Type 2 diabetes mellitus with other diabetic neurological complication: Secondary | ICD-10-CM

## 2019-08-18 NOTE — Progress Notes (Signed)
Subjective: 73 year old female presents the office today for follow-up evaluation and ulcer to her right big toe.  Overall the toe is doing better but still present.  She has been keeping Silvadene on the toe daily.  Denies any increase in swelling or any redness or drainage.  She has no other concerns today.   Objective: AAO x3, NAD DP/PT pulses palpable bilaterally, CRT less than 3 seconds Ulceration continues at the distal aspect of the right hallux.  Upon debridement of the hyperkeratotic tissue and debridement of the wound after debridement measured 0.7 x 0.3 x 0.2 cm.  Prior to debridement 0.5 x 0.2 cm.  Granular wound base.  No probing to bone, undermining or tunneling.  No fluctuation crepitation.  No significant erythema, ascending cellulitis no increase in warmth.  Hallux malleus is present.   Assessment: Right hallux ulceration   Plan: -All treatment options discussed with the patient including all alternatives, risks, complications.  -The wound was sharply debrided down to healthy, granular procedure of all nonviable tissue was #312 with scalpel. Will switch to medihoney dressing changes daily and this was dispensed today.  Offloading at all times.  Continue with surgical shoe and offloading. -Monitor for any clinical signs or symptoms of infection and directed to call the office immediately should any occur or go to the ER.  Return in about 2 weeks (around 09/01/2019).  Trula Slade DPM

## 2019-09-01 ENCOUNTER — Ambulatory Visit: Payer: Medicare HMO | Admitting: Podiatry

## 2019-09-09 ENCOUNTER — Telehealth: Payer: Self-pay

## 2019-09-10 NOTE — Telephone Encounter (Signed)
error 

## 2019-10-10 ENCOUNTER — Telehealth: Payer: Self-pay | Admitting: *Deleted

## 2019-10-10 ENCOUNTER — Encounter: Payer: Self-pay | Admitting: Podiatry

## 2019-10-10 ENCOUNTER — Ambulatory Visit: Payer: Medicare HMO | Admitting: Podiatry

## 2019-10-10 ENCOUNTER — Other Ambulatory Visit: Payer: Self-pay

## 2019-10-10 ENCOUNTER — Ambulatory Visit (INDEPENDENT_AMBULATORY_CARE_PROVIDER_SITE_OTHER): Payer: Medicare HMO

## 2019-10-10 DIAGNOSIS — L97519 Non-pressure chronic ulcer of other part of right foot with unspecified severity: Secondary | ICD-10-CM

## 2019-10-10 DIAGNOSIS — I739 Peripheral vascular disease, unspecified: Secondary | ICD-10-CM | POA: Diagnosis not present

## 2019-10-10 DIAGNOSIS — M869 Osteomyelitis, unspecified: Secondary | ICD-10-CM

## 2019-10-10 MED ORDER — DOXYCYCLINE HYCLATE 100 MG PO TABS: 100.0000 mg | ORAL_TABLET | Freq: Two times a day (BID) | ORAL | 0 refills | Status: DC

## 2019-10-10 MED ORDER — AMOXICILLIN-POT CLAVULANATE 875-125 MG PO TABS: 1.0000 | ORAL_TABLET | Freq: Two times a day (BID) | ORAL | 0 refills | Status: DC

## 2019-10-10 NOTE — Telephone Encounter (Signed)
DOS 10/15/2019 AMPUTATATION TOE INTERPHALANGEAL HALLUX RT - [id number]  [facility] MEDICARE: Eligibility Date - Dec 18, 2009 - Dec 17, 9998  Surgical - 2  Co-Insurance - Surgical  In Network Individual  Insurance Type Health Maintenance Organization (HMO) - [place] SPECIALIST 0 % Calendar Year  Out of Pocket (Stop Loss) - Health Benefit Plan Coverage  In Network Individual  $[phone]  - $954.20  Year to Date  $[phone] Remaining   Authorization Response  Certification Number [id number] Status CERTIFIED IN TOTAL Message Authorization is based on information provided; it is not a guarantee of payment. Billed services are subject to medical necessity, appropriate setting, billing/coding, plan limits, eligibility at time of service. Verify benefits online or call Customer Service.

## 2019-10-10 NOTE — Progress Notes (Signed)
Subjective: 73 year old female presents the office today for concerns of a recurrent wound of her right big toe and she says been draining for the last 3 days patient had no pain.  After I last saw her she says the wound completely healed however it opened back up recently.  She denies any pus or any swelling or redness to her foot.Denies any systemic complaints such as fevers, chills, nausea, vomiting. No acute changes since last appointment, and no other complaints at this time.   Objective: AAO x3, NAD DP/PT pulses palpable bilaterally, CRT less than 3 seconds Ulceration with hyperkeratotic tissue at the distal aspect of the right hallux.  Picture below supportive treatment.  However after debridement the wound measures about 1.5 x 1 cm and it does probe to bone.  There is edema to the toe.  No significant erythema or warmth of the foot.  No fluctuation crepitation.  No malodor.  In general the hallux is long and there is hallux malleus present. No pain with calf compression, swelling, warmth, erythema     Assessment: Osteomyelitis with recurrent ulceration right hallux  Plan: -All treatment options discussed with the patient including all alternatives, risks, complications.  -X-rays obtained reviewed.  Cortical changes the distal phalanx consistent with osteomyelitis -ABI was performed in the office.  Left foot 1.03 was 1.04 -Given x-ray findings of both the wound probes to bone I recommended partial amputation of the toe.  She is upset by this news later we have long discussion regarding treatment options and due to this is the best option for her. -Debrided the wound on the right hallux of any complications of the healthy tissue but unfortunately wound did probe to bone.  Wound culture obtained today. -Wound previously has been superficial but this is the first time it is this deep and is in a different location. -Will plan for partial toe amputation next Wednesday. -The incision  placement as well as the postoperative course was discussed with the patient. I discussed risks of the surgery which include, but not limited to, infection, bleeding, pain, swelling, need for further surgery, delayed or nonhealing, painful or ugly scar, numbness or sensation changes, over/under correction, recurrence, transfer lesions, further deformity, hardware failure, DVT/PE, loss of toe/foot. Patient understands these risks and wishes to proceed with surgery. The surgical consent was reviewed with the patient all 3 pages were signed. No promises or guarantees were given to the outcome of the procedure. All questions were answered to the best of my ability. Before the surgery the patient was encouraged to call the office if there is any further questions. The surgery will be performed at the [facility] on an outpatient basis -Continue Betadine dressing changes for now. -Patient encouraged to call the office with any questions, concerns, change in symptoms.   Trula Slade DPM

## 2019-10-10 NOTE — Patient Instructions (Signed)
Pre-Operative Instructions  Congratulations, you have decided to take an important step towards improving your quality of life.  You can be assured that the doctors and staff at [facility] will be with you every step of the way.  Here are some important things you should know:  1. Plan to be at the surgery center/hospital at least 1 (one) hour prior to your scheduled time, unless otherwise directed by the surgical center/hospital staff.  You must have a responsible adult accompany you, remain during the surgery and drive you home.  Make sure you have directions to the surgical center/hospital to ensure you arrive on time. 2. If you are having surgery at [facility], you will need a copy of your medical history and physical form from your family physician within one month prior to the date of surgery. We will give you a form for your primary physician to complete.  3. We make every effort to accommodate the date you request for surgery.  However, there are times where surgery dates or times have to be moved.  We will contact you as soon as possible if a change in schedule is required.   4. No aspirin/ibuprofen for one week before surgery.  If you are on aspirin, any non-steroidal anti-inflammatory medications (Mobic, Aleve, Ibuprofen) should not be taken seven (7) days prior to your surgery.  You make take Tylenol for pain prior to surgery.  5. Medications - If you are taking daily heart and blood pressure medications, seizure, reflux, allergy, asthma, anxiety, pain or diabetes medications, make sure you notify the surgery center/hospital before the day of surgery so they can tell you which medications you should take or avoid the day of surgery. 6. No food or drink after midnight the night before surgery unless directed otherwise by surgical center/hospital staff. 7. No alcoholic beverages 24-hours prior to surgery.  No smoking 24-hours prior or 24-hours after  surgery. 8. Wear loose pants or shorts. They should be loose enough to fit over bandages, boots, and casts. 9. Don't wear slip-on shoes. Sneakers are preferred. 10. Bring your boot with you to the surgery center/hospital.  Also bring crutches or a walker if your physician has prescribed it for you.  If you do not have this equipment, it will be provided for you after surgery. 11. If you have not been contacted by the surgery center/hospital by the day before your surgery, call to confirm the date and time of your surgery. 12. Leave-time from work may vary depending on the type of surgery you have.  Appropriate arrangements should be made prior to surgery with your employer. 13. Prescriptions will be provided immediately following surgery by your doctor.  Fill these as soon as possible after surgery and take the medication as directed. Pain medications will not be refilled on weekends and must be approved by the doctor. 14. Remove nail polish on the operative foot and avoid getting pedicures prior to surgery. 15. Wash the night before surgery.  The night before surgery wash the foot and leg well with water and the antibacterial soap provided. Be sure to pay special attention to beneath the toenails and in between the toes.  Wash for at least three (3) minutes. Rinse thoroughly with water and dry well with a towel.  Perform this wash unless told not to do so by your physician.  Enclosed: 1 Ice pack (please put in freezer the night before surgery)   1 Hibiclens skin cleaner     Pre-op instructions  If you have any questions regarding the instructions, please do not hesitate to call our office.  [place]: [address], [place], [place] [address] -- [phone]  [place]: [address] [place], [place] [address] -- [phone]  [place]: [address]  [place], [place] [address] -- [phone]   Website: [email or web address] 

## 2019-10-13 ENCOUNTER — Telehealth: Payer: Self-pay | Admitting: *Deleted

## 2019-10-13 ENCOUNTER — Other Ambulatory Visit: Payer: Self-pay | Admitting: Family Medicine

## 2019-10-13 LAB — WOUND CULTURE
MICRO NUMBER:: 1023986
SPECIMEN QUALITY:: ADEQUATE

## 2019-10-13 NOTE — Telephone Encounter (Signed)
"  Can someone call Ms. Loftus.  I called to give her the arrival time, which will be 10:15 am.  She said she does not believe it.  She said Dr. Jacqualyn Posey told her he would give her a call today and let her know her lab results and let her know if the surgery would be done here or at the hospital."  I will let Dr. Jacqualyn Posey know.  I think she's okay to have it done there. "I told her she was probably okay to have it done here since you had not called to cancel her surgery but she said she wanted to hear it from Dr. Jacqualyn Posey.  So, can you call her?"  I'll let Dr. Jacqualyn Posey know.

## 2019-10-14 ENCOUNTER — Other Ambulatory Visit: Payer: Self-pay | Admitting: Podiatry

## 2019-10-14 ENCOUNTER — Telehealth: Payer: Self-pay | Admitting: *Deleted

## 2019-10-14 NOTE — Telephone Encounter (Signed)
I am calling to reiterate per Dr. Jacqualyn Posey that you are okay to have the surgery at [facility] on tomorrow.  He said to continue taking the antibiotic.  He said he may switch you to something else on tomorrow.  "Okay, thank you so much."

## 2019-10-14 NOTE — Progress Notes (Signed)
error 

## 2019-10-14 NOTE — Telephone Encounter (Signed)
"  Dr. Jacqualyn Posey said he was going to call me yesterday to go over my lab results.  He said he was going to let me know where my surgery was going to be done at.    I haven't heard from him.  Also, I take a 325 mg Aspirin every day.  The surgical center said I needed to ask Dr. Jacqualyn Posey if I needed to stop taking it."  Your surgery will be done at [facility].  Dr. Jacqualyn Posey said he reviewed your results last night.  He said you should be fine having it done at [facility].  He asked me to send your results to the nurse at the surgical center to make sure it's okay to have the surgery there.  He also stated that at this point, it's too late to stop taking the aspirin.  He said usually it's held for five days.  So, you do not need to stop taking it.  "Okay, thank you so much."  I sent a copy of the wound culture results to Caren Griffins at [facility].

## 2019-10-15 ENCOUNTER — Encounter: Payer: Self-pay | Admitting: Podiatry

## 2019-10-15 ENCOUNTER — Telehealth: Payer: Self-pay | Admitting: Family Medicine

## 2019-10-15 DIAGNOSIS — M86171 Other acute osteomyelitis, right ankle and foot: Secondary | ICD-10-CM | POA: Diagnosis not present

## 2019-10-15 DIAGNOSIS — M86671 Other chronic osteomyelitis, right ankle and foot: Secondary | ICD-10-CM | POA: Diagnosis not present

## 2019-10-15 DIAGNOSIS — I10 Essential (primary) hypertension: Secondary | ICD-10-CM | POA: Diagnosis not present

## 2019-10-15 DIAGNOSIS — M19071 Primary osteoarthritis, right ankle and foot: Secondary | ICD-10-CM | POA: Diagnosis not present

## 2019-10-15 NOTE — Telephone Encounter (Signed)
Awaiting form

## 2019-10-15 NOTE — Telephone Encounter (Signed)
Received handicap placard to be filled out by dr Buelah Manis  Put with intake form on 10/15/19 Will send to dr Buelah Manis

## 2019-10-16 NOTE — Telephone Encounter (Signed)
Received form and routed to provider for signature.

## 2019-10-17 ENCOUNTER — Telehealth: Payer: Self-pay | Admitting: *Deleted

## 2019-10-17 NOTE — Telephone Encounter (Signed)
Called patient and the patient stated that she was doing fine from the surgery patient had on Wednesday with Dr Jacqualyn Posey and that she could not feel her foot but was checking all the toes for the color and the toes looked normal and I stated to call the on call doctor if any changes and there is not any fever or chills and not any nausea and patient also stated that she has had no pain at all and I stated to call the [place] office if any concerns or questions at [phone] and also stated that we would be open October 31st, 2020 till 12 pm. Lattie Haw

## 2019-10-17 NOTE — Telephone Encounter (Signed)
noted 

## 2019-10-17 NOTE — Telephone Encounter (Signed)
Call placed to patient and patient made aware.   Appointment scheduled for 10/27/2019. Patient states that she will not be able to come in sooner as she just had a few toes removed.

## 2019-10-17 NOTE — Telephone Encounter (Signed)
She needs in office visit, has not been seen > 1 year  She is over due for labs as well She can pick up form at her OV

## 2019-10-21 ENCOUNTER — Encounter: Payer: Self-pay | Admitting: Podiatry

## 2019-10-21 ENCOUNTER — Ambulatory Visit (INDEPENDENT_AMBULATORY_CARE_PROVIDER_SITE_OTHER): Payer: Self-pay | Admitting: Podiatry

## 2019-10-21 ENCOUNTER — Other Ambulatory Visit: Payer: Self-pay

## 2019-10-21 ENCOUNTER — Ambulatory Visit (INDEPENDENT_AMBULATORY_CARE_PROVIDER_SITE_OTHER): Payer: Medicare HMO

## 2019-10-21 DIAGNOSIS — S98111A Complete traumatic amputation of right great toe, initial encounter: Secondary | ICD-10-CM

## 2019-10-21 DIAGNOSIS — L97519 Non-pressure chronic ulcer of other part of right foot with unspecified severity: Secondary | ICD-10-CM

## 2019-10-21 DIAGNOSIS — I739 Peripheral vascular disease, unspecified: Secondary | ICD-10-CM

## 2019-10-27 ENCOUNTER — Other Ambulatory Visit: Payer: Self-pay

## 2019-10-27 ENCOUNTER — Encounter: Payer: Self-pay | Admitting: Family Medicine

## 2019-10-27 ENCOUNTER — Ambulatory Visit (INDEPENDENT_AMBULATORY_CARE_PROVIDER_SITE_OTHER): Payer: Medicare HMO | Admitting: Family Medicine

## 2019-10-27 VITALS — BP 128/74 | HR 88 | Temp 98.5°F | Resp 15 | Ht 70.0 in | Wt 174.2 lb

## 2019-10-27 DIAGNOSIS — E1322 Other specified diabetes mellitus with diabetic chronic kidney disease: Secondary | ICD-10-CM

## 2019-10-27 DIAGNOSIS — Z8673 Personal history of transient ischemic attack (TIA), and cerebral infarction without residual deficits: Secondary | ICD-10-CM

## 2019-10-27 DIAGNOSIS — IMO0002 Reserved for concepts with insufficient information to code with codable children: Secondary | ICD-10-CM

## 2019-10-27 DIAGNOSIS — I10 Essential (primary) hypertension: Secondary | ICD-10-CM | POA: Diagnosis not present

## 2019-10-27 DIAGNOSIS — Z1159 Encounter for screening for other viral diseases: Secondary | ICD-10-CM | POA: Diagnosis not present

## 2019-10-27 DIAGNOSIS — E1365 Other specified diabetes mellitus with hyperglycemia: Secondary | ICD-10-CM | POA: Diagnosis not present

## 2019-10-27 DIAGNOSIS — Z89429 Acquired absence of other toe(s), unspecified side: Secondary | ICD-10-CM | POA: Diagnosis not present

## 2019-10-27 DIAGNOSIS — E785 Hyperlipidemia, unspecified: Secondary | ICD-10-CM

## 2019-10-27 DIAGNOSIS — N184 Chronic kidney disease, stage 4 (severe): Secondary | ICD-10-CM

## 2019-10-27 NOTE — Progress Notes (Signed)
Subjective:    Patient ID: Amanda Krueger, female    DOB: 08-07-1946, 73 y.o.   MRN: [id number]  Patient presents for Diabetes    Pt here to f/u chronic medical probems   She has been following with podiatry she is s/p right toe amputation    March had appt with Nephrology Dr. Hinda Lenis , has Stage 4 kidney disease, she does not want dialysis but this has been suggested to her recently    DM- has not been checked since August 2019 at that time was  9.7%   States she will have low blood sugars 70-80's she will feel sweaty,when she eats more healthy such as salads   CBG this AM was  121 , states she ate more last - grilled chicken sandwhich , few fries   Lantus- 18 units at bedtime  Novolin- 4-6 units before meals, if her sugar is low < 100 she will not take Novolin, by lunch it is around 180 she will take Novolin, she does not follow any sliding scale she just takes her from what she feels like she needs at that time??   CKD- on calcitriol every other day and vitamin D / Iron def anemia -    HTN- taking clonidine, labetalol and norvasc, she has not seen cardiology> 1 year    Hyperlipidemia- did not tolerate pravastatin or zetia , takes fish oil randomly   Per cardiology not interested Repatha    Dr. Baird Cancer- did Cataract surgery on both eyes    History of CVA on full dose ASA     Review Of Systems:  GEN- denies fatigue, fever, weight loss,weakness, recent illness HEENT- denies eye drainage, change in vision, nasal discharge, CVS- denies chest pain, palpitations RESP- denies SOB, cough, wheeze ABD- denies N/V, change in stools, abd pain GU- denies dysuria, hematuria, dribbling, incontinence MSK- denies joint pain, muscle aches, injury Neuro- denies headache, dizziness, syncope, seizure activity       Objective:    BP 128/74   Pulse 88   Temp 98.5 F (36.9 C) (Oral)   Resp 15   Ht 5\' 10"  (1.778 m)   Wt 174 lb 4 oz (79 kg)   SpO2 97%   BMI 25.00 kg/m  GEN- NAD,  alert and oriented x3,walks with assistance  HEENT- PERRL, EOMI, non injected sclera, pink conjunctiva, MMM, oropharynx clear Neck- Supple, no thyromegaly CVS- RRR, no murmur RESP-CTAB ABD-NABS,soft,NT,ND EXT- No edema, right foot status post toe amputation incision intact sutures still in place mild swelling of the foot. Pulses- Radial palpated, DP-decreased bilaterally +        Assessment & Plan:  Discussed compliance and following up with routine visits.  She was also given a handicap placard.   Problem List Items Addressed This Visit      Unprioritized   Chronic kidney disease (CKD), stage IV (severe) ([place])    She is still following with nephrology.  She declines hemodialysis at this time.  She is on iron replacement as well as calcitriol.      History of stroke    She is on full dose aspirin.  Unfortunately her main risk factor diabetes mellitus is still uncontrolled      HTN (hypertension)    Blood pressure is controlled.  No change in medication.  She has been advised if her blood pressure goes up she can take an additional metoprolol 100 mg which was noted in the last cardiology visit.  Hyperlipidemia    Recheck her lipid panel.  Her next that would be one of the injectable cholesterol medications.  She states that she is open to this      Relevant Orders   Lipid Panel   Uncontrolled secondary diabetes mellitus with stage 4 CKD (GFR 15-29) (HCC) - Primary    Uncontrolled diabetes mellitus.  She is already had an amputation she needs tight glycemic control less than 7%.  Very unclear what she is taking at home during her mealtimes.  But I think she is getting significant hypoglycemic episodes in the morning we will decrease her Lantus to 16 units.  Expect that her renal function has worsened significantly which is why her blood sugars are fluctuating.  Pending her A1c we will then see if we can get her consistent with a mealtime coverage      Relevant Orders    Comprehensive metabolic panel   CBC with Differential   Hemoglobin A1c   HM Diabetes Foot Exam (Completed)    Other Visit Diagnoses    Need for hepatitis C screening test       Relevant Orders   Hepatitis C Antibody      Note: This dictation was prepared with Dragon dictation along with smaller phrase technology. Any transcriptional errors that result from this process are unintentional.

## 2019-10-27 NOTE — Assessment & Plan Note (Signed)
She is still following with nephrology.  She declines hemodialysis at this time.  She is on iron replacement as well as calcitriol.

## 2019-10-27 NOTE — Assessment & Plan Note (Signed)
She is on full dose aspirin.  Unfortunately her main risk factor diabetes mellitus is still uncontrolled

## 2019-10-27 NOTE — Assessment & Plan Note (Signed)
Uncontrolled diabetes mellitus.  She is already had an amputation she needs tight glycemic control less than 7%.  Very unclear what she is taking at home during her mealtimes.  But I think she is getting significant hypoglycemic episodes in the morning we will decrease her Lantus to 16 units.  Expect that her renal function has worsened significantly which is why her blood sugars are fluctuating.  Pending her A1c we will then see if we can get her consistent with a mealtime coverage

## 2019-10-27 NOTE — Assessment & Plan Note (Signed)
Blood pressure is controlled.  No change in medication.  She has been advised if her blood pressure goes up she can take an additional metoprolol 100 mg which was noted in the last cardiology visit.

## 2019-10-27 NOTE — Assessment & Plan Note (Signed)
Recheck her lipid panel.  Her next that would be one of the injectable cholesterol medications.  She states that she is open to this

## 2019-10-27 NOTE — Progress Notes (Signed)
Subjective: Amanda Krueger is a 73 y.o. is seen today in office s/p right hallux partial toe amputaiton preformed on 11/15/2019.  She states that she is feeling well she has not been having any pain.  She can wear the surgical shoe she is try to stay off her foot as much as possible.  Denies any systemic complaints such as fevers, chills, nausea, vomiting. No calf pain, chest pain, shortness of breath.   Objective: General: No acute distress, AAOx3  DP/PT pulses palpable 2/4, CRT < 3 sec to all digits.  Protective sensation intact. Motor function intact.  RIGHT foot: Incision is well coapted without any evidence of dehiscence and sutures are intact. There is no surrounding erythema, ascending cellulitis, fluctuance, crepitus, malodor, drainage/purulence. There is mild edema around the surgical site. There is no pain along the surgical site.  No other areas of tenderness to bilateral lower extremities.  No other open lesions or pre-ulcerative lesions.  No pain with calf compression, swelling, warmth, erythema.   Assessment and Plan:  Status post right partial hallux amputation, doing well with no complications   -Treatment options discussed including all alternatives, risks, and complications -X-rays obtained and reviewed.  No evidence of acute fracture identified.  Status post partial toe irritation of the big toe. -Incision appears to be healing well.  Antibiotic ointment and dressing applied.  Keep the dressing clean, dry, intact -Remain in surgical shoe and limit weightbearing.  She can be weightbearing as tolerated. -Ice/elevation -Pain medication as needed. -Monitor for any clinical signs or symptoms of infection and DVT/PE and directed to call the office immediately should any occur or go to the ER. -Follow-up as scheduled for possible suture removal or sooner if any problems arise. In the meantime, encouraged to call the office with any questions, concerns, change in symptoms.    Celesta Gentile, DPM

## 2019-10-27 NOTE — Patient Instructions (Addendum)
F/U 4 months  Decrease lantus to 16 units

## 2019-10-28 ENCOUNTER — Encounter: Payer: Self-pay | Admitting: Podiatry

## 2019-10-28 ENCOUNTER — Ambulatory Visit (INDEPENDENT_AMBULATORY_CARE_PROVIDER_SITE_OTHER): Payer: Medicare HMO | Admitting: Podiatry

## 2019-10-28 DIAGNOSIS — M79676 Pain in unspecified toe(s): Secondary | ICD-10-CM

## 2019-10-28 DIAGNOSIS — Z89411 Acquired absence of right great toe: Secondary | ICD-10-CM | POA: Diagnosis not present

## 2019-10-28 DIAGNOSIS — Z89429 Acquired absence of other toe(s), unspecified side: Secondary | ICD-10-CM

## 2019-10-28 DIAGNOSIS — B351 Tinea unguium: Secondary | ICD-10-CM

## 2019-10-28 DIAGNOSIS — E1149 Type 2 diabetes mellitus with other diabetic neurological complication: Secondary | ICD-10-CM | POA: Diagnosis not present

## 2019-10-28 DIAGNOSIS — S98111A Complete traumatic amputation of right great toe, initial encounter: Secondary | ICD-10-CM

## 2019-10-28 LAB — LIPID PANEL
Cholesterol: 221 mg/dL — ABNORMAL HIGH (ref <200)
HDL: 73 mg/dL (ref >=50)
LDL Cholesterol (Calc): 127 mg/dL (calc) — ABNORMAL HIGH
Non-HDL Cholesterol (Calc): 148 mg/dL (calc) — ABNORMAL HIGH (ref <130)
Total CHOL/HDL Ratio: 3 (calc) (ref <5.0)
Triglycerides: 104 mg/dL (ref <150)

## 2019-10-28 LAB — COMPREHENSIVE METABOLIC PANEL
AG Ratio: 1.1 (calc) (ref 1.0–2.5)
ALT: 9 U/L (ref 6–29)
AST: 15 U/L (ref 10–35)
Albumin: 4.1 g/dL (ref 3.6–5.1)
Alkaline phosphatase (APISO): 81 U/L (ref 37–153)
BUN/Creatinine Ratio: 14 (calc) (ref 6–22)
BUN: 51 mg/dL — ABNORMAL HIGH (ref 7–25)
CO2: 26 mmol/L (ref 20–32)
Calcium: 10.2 mg/dL (ref 8.6–10.4)
Chloride: 101 mmol/L (ref 98–110)
Creat: 3.61 mg/dL — ABNORMAL HIGH (ref 0.60–0.93)
Globulin: 3.8 g/dL (calc) — ABNORMAL HIGH (ref 1.9–3.7)
Glucose, Bld: 105 mg/dL — ABNORMAL HIGH (ref 65–99)
Potassium: 5.3 mmol/L (ref 3.5–5.3)
Sodium: 139 mmol/L (ref 135–146)
Total Bilirubin: 0.5 mg/dL (ref 0.2–1.2)
Total Protein: 7.9 g/dL (ref 6.1–8.1)

## 2019-10-28 LAB — CBC WITH DIFFERENTIAL/PLATELET
Absolute Monocytes: 377 cells/uL (ref 200–950)
Basophils Absolute: 41 cells/uL (ref 0–200)
Basophils Relative: 0.7 %
Eosinophils Absolute: 197 cells/uL (ref 15–500)
Eosinophils Relative: 3.4 %
HCT: 35 % (ref 35.0–45.0)
Hemoglobin: 11.4 g/dL — ABNORMAL LOW (ref 11.7–15.5)
Lymphs Abs: 2088 cells/uL (ref 850–3900)
MCH: 28.3 pg (ref 27.0–33.0)
MCHC: 32.6 g/dL (ref 32.0–36.0)
MCV: 86.8 fL (ref 80.0–100.0)
MPV: 11.4 fL (ref 7.5–12.5)
Monocytes Relative: 6.5 %
Neutro Abs: 3097 cells/uL (ref 1500–7800)
Neutrophils Relative %: 53.4 %
Platelets: 191 10*3/uL (ref 140–400)
RBC: 4.03 10*6/uL (ref 3.80–5.10)
RDW: 14.3 % (ref 11.0–15.0)
Total Lymphocyte: 36 %
WBC: 5.8 10*3/uL (ref 3.8–10.8)

## 2019-10-28 LAB — HEMOGLOBIN A1C
Hgb A1c MFr Bld: 8.1 % of total Hgb — ABNORMAL HIGH (ref <5.7)
Mean Plasma Glucose: 186 (calc)
eAG (mmol/L): 10.3 (calc)

## 2019-10-28 LAB — HEPATITIS C ANTIBODY
Hepatitis C Ab: NONREACTIVE
SIGNAL TO CUT-OFF: 0.04 (ref <1.00)

## 2019-10-28 NOTE — Progress Notes (Signed)
Subjective: Amanda Krueger is a 73 y.o. is seen today in office s/p right hallux partial toe amputaiton preformed on 11/15/2019.  She states that overall she is doing well and still not having any significant pain and not taking any pain medication.  She has been off antibiotics for some time and overall feels well and denies any fevers, chills, nausea, vomiting.  Denies any calf pain, chest pain, shortness of breath.  Objective: General: No acute distress, AAOx3  DP/PT pulses palpable 2/4, CRT < 3 sec to all remaining digits.  Protective sensation intact. Motor function intact.  RIGHT foot: Incision is well coapted without any evidence of dehiscence and sutures are intact.  There is minimal swelling but there is no erythema or warmth.  There is no fluctuation or crepitation.  There is still some motion across the incision. The nails appear to be hypertrophic, dystrophic with yellow-brown discoloration in the nails are having some dried blood along them from irritation inside shoes.  Upon debridement there is no ulceration identified or any active bleeding. No pain with calf compression, swelling, warmth, erythema.   Assessment and Plan:  Status post right partial hallux amputation, doing well with no complications ; symptomatic onychomycosis  -Treatment options discussed including all alternatives, risks, and complications -Incision appears to be healing well with any signs of infection.  Antibiotic ointment and a dressing was applied.  Keep clean, dry, intact.  Remain in surgical shoe and limit weightbearing.  She states that she has been on her feet more than normal and she is going to try to do better about staying off of her feet.  No active signs of infection so we will hold off any further antibiotics. -Debrided nails x9 without any complications or bleeding  Trula Slade DPM

## 2019-10-31 DIAGNOSIS — D509 Iron deficiency anemia, unspecified: Secondary | ICD-10-CM | POA: Insufficient documentation

## 2019-10-31 DIAGNOSIS — R809 Proteinuria, unspecified: Secondary | ICD-10-CM | POA: Insufficient documentation

## 2019-11-06 ENCOUNTER — Other Ambulatory Visit: Payer: Self-pay | Admitting: Nephrology

## 2019-11-06 ENCOUNTER — Other Ambulatory Visit (HOSPITAL_COMMUNITY): Payer: Self-pay | Admitting: Nephrology

## 2019-11-06 DIAGNOSIS — N170 Acute kidney failure with tubular necrosis: Secondary | ICD-10-CM

## 2019-11-06 DIAGNOSIS — E875 Hyperkalemia: Secondary | ICD-10-CM | POA: Diagnosis not present

## 2019-11-06 DIAGNOSIS — N184 Chronic kidney disease, stage 4 (severe): Secondary | ICD-10-CM | POA: Diagnosis not present

## 2019-11-06 DIAGNOSIS — E559 Vitamin D deficiency, unspecified: Secondary | ICD-10-CM | POA: Diagnosis not present

## 2019-11-06 DIAGNOSIS — R809 Proteinuria, unspecified: Secondary | ICD-10-CM | POA: Diagnosis not present

## 2019-11-06 DIAGNOSIS — E211 Secondary hyperparathyroidism, not elsewhere classified: Secondary | ICD-10-CM | POA: Diagnosis not present

## 2019-11-06 DIAGNOSIS — E1129 Type 2 diabetes mellitus with other diabetic kidney complication: Secondary | ICD-10-CM | POA: Diagnosis not present

## 2019-11-07 ENCOUNTER — Other Ambulatory Visit: Payer: Self-pay | Admitting: Cardiology

## 2019-11-07 DIAGNOSIS — E113513 Type 2 diabetes mellitus with proliferative diabetic retinopathy with macular edema, bilateral: Secondary | ICD-10-CM | POA: Diagnosis not present

## 2019-11-11 ENCOUNTER — Other Ambulatory Visit: Payer: Self-pay

## 2019-11-11 ENCOUNTER — Ambulatory Visit (INDEPENDENT_AMBULATORY_CARE_PROVIDER_SITE_OTHER): Payer: Medicare HMO | Admitting: Podiatry

## 2019-11-11 DIAGNOSIS — S98111A Complete traumatic amputation of right great toe, initial encounter: Secondary | ICD-10-CM

## 2019-11-12 ENCOUNTER — Ambulatory Visit (HOSPITAL_COMMUNITY)
Admission: RE | Admit: 2019-11-12 | Discharge: 2019-11-12 | Disposition: A | Discharge status: Home / Self Care | Payer: Medicare HMO | Source: Ambulatory Visit | Attending: Nephrology | Admitting: Nephrology

## 2019-11-12 DIAGNOSIS — N170 Acute kidney failure with tubular necrosis: Secondary | ICD-10-CM | POA: Diagnosis not present

## 2019-11-12 DIAGNOSIS — N179 Acute kidney failure, unspecified: Secondary | ICD-10-CM | POA: Diagnosis not present

## 2019-11-18 DIAGNOSIS — S98111A Complete traumatic amputation of right great toe, initial encounter: Secondary | ICD-10-CM | POA: Insufficient documentation

## 2019-11-18 NOTE — Progress Notes (Signed)
Subjective: Amanda Krueger is a 73 y.o. is seen today in office s/p right hallux partial toe amputaiton preformed on 11/15/2019.  She presented for suture removal.  She states that she is doing well and she denies any pain. Surgical shoe and keeping her foot elevated. Denies any fevers, chills, nausea, vomiting.  Denies any calf pain, chest pain, shortness of breath.  Objective: General: No acute distress, AAOx3  DP/PT pulses palpable 2/4, CRT < 3 sec to all remaining digits.  Protective sensation intact. Motor function intact.  RIGHT foot: Incision is well coapted without any evidence of dehiscence and sutures are intact.  There is minimal and improved swelling to the surgical site.  No surrounding erythema, ascending cellulitis there is no fluctuation crepitation.  There is no malodor.  No pain with calf compression, swelling, warmth, erythema.   Assessment and Plan:  Status post right partial hallux amputation, doing well with no complications   -Treatment options discussed including all alternatives, risks, and complications -Incision appears to be healing well with any signs of infection.  Sutures removed today.  Steri-Strips were applied for reinforcement.  Antibiotic ointment and a dressing was applied.  Keep clean, dry, intact.  Remain in surgical shoe and limit weightbearing.   Trula Slade DPM

## 2019-11-21 DIAGNOSIS — E113513 Type 2 diabetes mellitus with proliferative diabetic retinopathy with macular edema, bilateral: Secondary | ICD-10-CM | POA: Diagnosis not present

## 2019-11-24 ENCOUNTER — Ambulatory Visit (INDEPENDENT_AMBULATORY_CARE_PROVIDER_SITE_OTHER): Payer: Self-pay | Admitting: Podiatry

## 2019-11-24 ENCOUNTER — Other Ambulatory Visit: Payer: Self-pay

## 2019-11-24 DIAGNOSIS — Z89429 Acquired absence of other toe(s), unspecified side: Secondary | ICD-10-CM

## 2019-11-24 DIAGNOSIS — S98111A Complete traumatic amputation of right great toe, initial encounter: Secondary | ICD-10-CM

## 2019-11-30 ENCOUNTER — Other Ambulatory Visit: Payer: Self-pay | Admitting: Family Medicine

## 2019-11-30 NOTE — Progress Notes (Signed)
Subjective: Amanda Krueger is a 73 y.o. is seen today in office s/p right hallux partial toe amputaiton preformed on 11/15/2019.  She states that she is doing well she has no concerns.  She is still in a surgical shoe.  She is having no pain.  Denies any swelling or redness. Denies any fevers, chills, nausea, vomiting.  Denies any calf pain, chest pain, shortness of breath.  Objective: General: No acute distress, AAOx3  DP/PT pulses palpable 2/4, CRT < 3 sec to all remaining digits.  Protective sensation intact. Motor function intact.  RIGHT foot: Incision is well coapted without any evidence of dehiscence and scar is well formed.  There is minimal swelling to the surgical site.  No surrounding erythema, ascending cellulitis there is no fluctuation crepitation.  There is no malodor. No pain  No pain with calf compression, swelling, warmth, erythema.   Assessment and Plan:  Status post right partial hallux amputation, doing well with no complications   -Treatment options discussed including all alternatives, risks, and complications -Incision is well-healed at this time.  Any pain in the surgical shoe for now but she can slowly start to transition back into wearing a regular shoe.  Discussed the importance of daily foot inspection.  Return in about 6 weeks (around 01/05/2020).  Trula Slade DPM

## 2019-12-01 DIAGNOSIS — N184 Chronic kidney disease, stage 4 (severe): Secondary | ICD-10-CM | POA: Diagnosis not present

## 2019-12-01 DIAGNOSIS — E1129 Type 2 diabetes mellitus with other diabetic kidney complication: Secondary | ICD-10-CM | POA: Diagnosis not present

## 2019-12-01 DIAGNOSIS — R809 Proteinuria, unspecified: Secondary | ICD-10-CM | POA: Diagnosis not present

## 2019-12-01 DIAGNOSIS — E875 Hyperkalemia: Secondary | ICD-10-CM | POA: Diagnosis not present

## 2019-12-01 DIAGNOSIS — N170 Acute kidney failure with tubular necrosis: Secondary | ICD-10-CM | POA: Diagnosis not present

## 2019-12-01 DIAGNOSIS — E559 Vitamin D deficiency, unspecified: Secondary | ICD-10-CM | POA: Diagnosis not present

## 2019-12-01 DIAGNOSIS — E211 Secondary hyperparathyroidism, not elsewhere classified: Secondary | ICD-10-CM | POA: Diagnosis not present

## 2019-12-04 DIAGNOSIS — N170 Acute kidney failure with tubular necrosis: Secondary | ICD-10-CM | POA: Diagnosis not present

## 2019-12-04 DIAGNOSIS — R809 Proteinuria, unspecified: Secondary | ICD-10-CM | POA: Diagnosis not present

## 2019-12-04 DIAGNOSIS — E211 Secondary hyperparathyroidism, not elsewhere classified: Secondary | ICD-10-CM | POA: Diagnosis not present

## 2019-12-04 DIAGNOSIS — E1129 Type 2 diabetes mellitus with other diabetic kidney complication: Secondary | ICD-10-CM | POA: Diagnosis not present

## 2019-12-04 DIAGNOSIS — E559 Vitamin D deficiency, unspecified: Secondary | ICD-10-CM | POA: Diagnosis not present

## 2019-12-04 DIAGNOSIS — N184 Chronic kidney disease, stage 4 (severe): Secondary | ICD-10-CM | POA: Diagnosis not present

## 2019-12-04 DIAGNOSIS — E875 Hyperkalemia: Secondary | ICD-10-CM | POA: Diagnosis not present

## 2020-01-01 ENCOUNTER — Other Ambulatory Visit: Payer: Self-pay | Admitting: Family Medicine

## 2020-01-24 ENCOUNTER — Other Ambulatory Visit: Payer: Self-pay | Admitting: Family Medicine

## 2020-01-28 ENCOUNTER — Other Ambulatory Visit: Payer: Self-pay | Admitting: *Deleted

## 2020-01-28 NOTE — Telephone Encounter (Signed)
Received fax requesting refill on Calcitrol.   Medication listed as historical and has not been prescribed by [facility].   Ok to refill?

## 2020-02-11 DIAGNOSIS — Z794 Long term (current) use of insulin: Secondary | ICD-10-CM | POA: Diagnosis not present

## 2020-02-11 DIAGNOSIS — G8929 Other chronic pain: Secondary | ICD-10-CM | POA: Diagnosis not present

## 2020-02-11 DIAGNOSIS — M25562 Pain in left knee: Secondary | ICD-10-CM | POA: Diagnosis not present

## 2020-02-11 DIAGNOSIS — Z89421 Acquired absence of other right toe(s): Secondary | ICD-10-CM | POA: Diagnosis not present

## 2020-02-11 DIAGNOSIS — I10 Essential (primary) hypertension: Secondary | ICD-10-CM | POA: Diagnosis not present

## 2020-02-11 DIAGNOSIS — M545 Low back pain: Secondary | ICD-10-CM | POA: Diagnosis not present

## 2020-02-11 DIAGNOSIS — E785 Hyperlipidemia, unspecified: Secondary | ICD-10-CM | POA: Diagnosis not present

## 2020-02-11 DIAGNOSIS — E1151 Type 2 diabetes mellitus with diabetic peripheral angiopathy without gangrene: Secondary | ICD-10-CM | POA: Diagnosis not present

## 2020-02-11 DIAGNOSIS — Z6825 Body mass index (BMI) 25.0-25.9, adult: Secondary | ICD-10-CM | POA: Diagnosis not present

## 2020-02-16 DIAGNOSIS — D631 Anemia in chronic kidney disease: Secondary | ICD-10-CM | POA: Diagnosis not present

## 2020-02-16 DIAGNOSIS — Z79899 Other long term (current) drug therapy: Secondary | ICD-10-CM | POA: Diagnosis not present

## 2020-02-16 DIAGNOSIS — N184 Chronic kidney disease, stage 4 (severe): Secondary | ICD-10-CM | POA: Diagnosis not present

## 2020-02-16 DIAGNOSIS — R809 Proteinuria, unspecified: Secondary | ICD-10-CM | POA: Diagnosis not present

## 2020-02-16 DIAGNOSIS — E559 Vitamin D deficiency, unspecified: Secondary | ICD-10-CM | POA: Diagnosis not present

## 2020-02-16 DIAGNOSIS — E875 Hyperkalemia: Secondary | ICD-10-CM | POA: Diagnosis not present

## 2020-02-17 ENCOUNTER — Other Ambulatory Visit: Payer: Self-pay | Admitting: Family Medicine

## 2020-02-20 DIAGNOSIS — N184 Chronic kidney disease, stage 4 (severe): Secondary | ICD-10-CM | POA: Diagnosis not present

## 2020-02-20 DIAGNOSIS — E559 Vitamin D deficiency, unspecified: Secondary | ICD-10-CM | POA: Diagnosis not present

## 2020-02-20 DIAGNOSIS — R809 Proteinuria, unspecified: Secondary | ICD-10-CM | POA: Diagnosis not present

## 2020-02-20 DIAGNOSIS — E875 Hyperkalemia: Secondary | ICD-10-CM | POA: Diagnosis not present

## 2020-02-20 DIAGNOSIS — E1129 Type 2 diabetes mellitus with other diabetic kidney complication: Secondary | ICD-10-CM | POA: Diagnosis not present

## 2020-02-20 DIAGNOSIS — Z79899 Other long term (current) drug therapy: Secondary | ICD-10-CM | POA: Diagnosis not present

## 2020-02-20 DIAGNOSIS — R600 Localized edema: Secondary | ICD-10-CM | POA: Diagnosis not present

## 2020-02-20 DIAGNOSIS — E211 Secondary hyperparathyroidism, not elsewhere classified: Secondary | ICD-10-CM | POA: Diagnosis not present

## 2020-02-25 ENCOUNTER — Encounter: Payer: Self-pay | Admitting: Family Medicine

## 2020-02-25 ENCOUNTER — Ambulatory Visit (INDEPENDENT_AMBULATORY_CARE_PROVIDER_SITE_OTHER): Payer: Medicare HMO | Admitting: Family Medicine

## 2020-02-25 ENCOUNTER — Other Ambulatory Visit: Payer: Self-pay

## 2020-02-25 VITALS — BP 130/64 | HR 82 | Temp 98.1°F | Resp 16 | Ht 70.0 in | Wt 180.0 lb

## 2020-02-25 DIAGNOSIS — I10 Essential (primary) hypertension: Secondary | ICD-10-CM

## 2020-02-25 DIAGNOSIS — E785 Hyperlipidemia, unspecified: Secondary | ICD-10-CM

## 2020-02-25 DIAGNOSIS — IMO0002 Reserved for concepts with insufficient information to code with codable children: Secondary | ICD-10-CM

## 2020-02-25 DIAGNOSIS — Z8673 Personal history of transient ischemic attack (TIA), and cerebral infarction without residual deficits: Secondary | ICD-10-CM | POA: Diagnosis not present

## 2020-02-25 DIAGNOSIS — S98111A Complete traumatic amputation of right great toe, initial encounter: Secondary | ICD-10-CM | POA: Diagnosis not present

## 2020-02-25 DIAGNOSIS — E1365 Other specified diabetes mellitus with hyperglycemia: Secondary | ICD-10-CM

## 2020-02-25 DIAGNOSIS — M5136 Other intervertebral disc degeneration, lumbar region: Secondary | ICD-10-CM | POA: Diagnosis not present

## 2020-02-25 DIAGNOSIS — E1322 Other specified diabetes mellitus with diabetic chronic kidney disease: Secondary | ICD-10-CM

## 2020-02-25 DIAGNOSIS — N184 Chronic kidney disease, stage 4 (severe): Secondary | ICD-10-CM

## 2020-02-25 DIAGNOSIS — Z89411 Acquired absence of right great toe: Secondary | ICD-10-CM | POA: Diagnosis not present

## 2020-02-25 LAB — HEMOGLOBIN A1C, FINGERSTICK: Hgb A1C (fingerstick): 9 % OF TOTAL HGB — ABNORMAL HIGH (ref <6.0)

## 2020-02-25 MED ORDER — CALCITRIOL 0.5 MCG PO CAPS: 0.5000 ug | ORAL_CAPSULE | ORAL | 0 refills | Status: DC

## 2020-02-25 MED ORDER — OXYCODONE-ACETAMINOPHEN 5-325 MG PO TABS: 1.0000 | ORAL_TABLET | ORAL | 0 refills | Status: DC | PRN

## 2020-02-25 NOTE — Assessment & Plan Note (Signed)
A1c in the office today at 9% which is increased from previous however she has been having hypoglycemia episodes with the higher doses of insulin.  Due to her renal function would not add any other oral medications.  At this time we will keep her at 16 units of the Lantus and NovoLog 5 units with meals.  She admits that she has been over indulging in the sweets and carbs and has actually gained about 6 pounds in the past couple months.  She is going to decrease these things.  If we can keep her A1c about 8% in the setting of her severe renal disease I think this will be beneficial without causing hypoglycemia at her age.

## 2020-02-25 NOTE — Assessment & Plan Note (Signed)
Blood pressure control no change in medication. 

## 2020-02-25 NOTE — Progress Notes (Signed)
Subjective:    Patient ID: Amanda Krueger, female    DOB: [birth year], 74 y.o.   MRN: [id number]  Patient presents for Follow-up (is not fasting)   She has been following with podiatry she is s/p right toe amputation    March had appt with Nephrology Dr. Hinda Lenis , has Stage 4 kidney disease, she does not want dialysis but this has been suggested . Her Last Cr 3's   Started on lasix  BID  for swelling, but she has not started yet    DM- Last A1C in Nov 2020 8.1%, Lantus was decreased to  16 units due to hypoglycemia symptoms   CBG last week was  120's , Novolog 5 units twice  He is only had 1 hypoglycemic episode which he believes is she forgot to eat dinner.      CKD- on calcitriol every other day and vitamin D / Iron def anemia -    HTN- taking clonidine, labetalol and norvasc, she has not seen cardiology> 1 year    Hyperlipidemia- did not tolerate pravastatin or zetia , takes fish oil randomly   Per cardiology not interested Repatha   Constipation- using miralax which helped    History of CVA on full dose ASA    Review Of Systems:  GEN- denies fatigue, fever, weight loss,weakness, recent illness HEENT- denies eye drainage, change in vision, nasal discharge, CVS- denies chest pain, palpitations RESP- denies SOB, cough, wheeze ABD- denies N/V, change in stools, abd pain GU- denies dysuria, hematuria, dribbling, incontinence MSK- denies joint pain, muscle aches, injury Neuro- denies headache, dizziness, syncope, seizure activity       Objective:    BP 130/64   Pulse 82   Temp 98.1 F (36.7 C) (Temporal)   Resp 16   Ht 5\' 10"  (1.778 m)   Wt 180 lb (81.6 kg)   SpO2 98%   BMI 25.83 kg/m  GEN- NAD, alert and oriented x3,walks with assistance  HEENT- PERRL, EOMI, non injected sclera, pink conjunctiva,  Neck- Supple, no thyromegaly CVS- RRR, no murmur RESP-CTAB ABD-NABS,soft,NT,ND EXT-1 + pedal foot edema  Pulses- Radial palpated, DP-decreased bilaterally  +        Assessment & Plan:      Problem List Items Addressed This Visit      Unprioritized   Amputation of right great toe (HCC)   Chronic kidney disease (CKD), stage IV (severe) ([place])    Management per nephrology.  She has been started on Lasix to help with her foot edema.      DDD (degenerative disc disease), lumbar    Oxycodone acetaminophen refilled.  She last received this prescription a few months ago.  She uses sparingly.  She has chronic pain from this problem as well as her joints as well as her foot      Relevant Medications   oxyCODONE-acetaminophen (PERCOCET/ROXICET) 5-325 MG tablet   History of stroke   HTN (hypertension) - Primary    Blood pressure control no change in medication.      Relevant Medications   furosemide (LASIX) 40 MG tablet   Hyperlipidemia    Does not tolerate statin drugs.      Relevant Medications   furosemide (LASIX) 40 MG tablet   Uncontrolled secondary diabetes mellitus with stage 4 CKD (GFR 15-29) (HCC)    A1c in the office today at 9% which is increased from previous however she has been having hypoglycemia episodes with the higher doses of insulin.  Due  to her renal function would not add any other oral medications.  At this time we will keep her at 16 units of the Lantus and NovoLog 5 units with meals.  She admits that she has been over indulging in the sweets and carbs and has actually gained about 6 pounds in the past couple months.  She is going to decrease these things.  If we can keep her A1c about 8% in the setting of her severe renal disease I think this will be beneficial without causing hypoglycemia at her age.      Relevant Orders   Hemoglobin A1C, fingerstick (Completed)      Note: This dictation was prepared with Dragon dictation along with smaller phrase technology. Any transcriptional errors that result from this process are unintentional.

## 2020-02-25 NOTE — Assessment & Plan Note (Signed)
Management per nephrology.  She has been started on Lasix to help with her foot edema.

## 2020-02-25 NOTE — Assessment & Plan Note (Signed)
Oxycodone acetaminophen refilled.  She last received this prescription a few months ago.  She uses sparingly.  She has chronic pain from this problem as well as her joints as well as her foot

## 2020-02-25 NOTE — Patient Instructions (Signed)
F/U 4 months  We will call with lab results  

## 2020-02-25 NOTE — Assessment & Plan Note (Signed)
Does not tolerate statin drugs.

## 2020-02-27 ENCOUNTER — Other Ambulatory Visit: Payer: Self-pay | Admitting: *Deleted

## 2020-02-27 MED ORDER — CALCITRIOL 0.25 MCG PO CAPS: 0.2500 ug | ORAL_CAPSULE | ORAL | 0 refills | Status: AC

## 2020-03-12 ENCOUNTER — Encounter: Payer: Self-pay | Admitting: Family Medicine

## 2020-03-12 DIAGNOSIS — E113513 Type 2 diabetes mellitus with proliferative diabetic retinopathy with macular edema, bilateral: Secondary | ICD-10-CM | POA: Diagnosis not present

## 2020-04-05 ENCOUNTER — Ambulatory Visit: Payer: Medicare HMO | Admitting: Podiatry

## 2020-04-05 ENCOUNTER — Encounter: Payer: Self-pay | Admitting: Podiatry

## 2020-04-05 ENCOUNTER — Other Ambulatory Visit: Payer: Self-pay | Admitting: *Deleted

## 2020-04-05 ENCOUNTER — Other Ambulatory Visit: Payer: Self-pay

## 2020-04-05 ENCOUNTER — Telehealth: Payer: Self-pay | Admitting: *Deleted

## 2020-04-05 VITALS — Temp 97.8°F

## 2020-04-05 DIAGNOSIS — M79676 Pain in unspecified toe(s): Secondary | ICD-10-CM

## 2020-04-05 DIAGNOSIS — E1149 Type 2 diabetes mellitus with other diabetic neurological complication: Secondary | ICD-10-CM

## 2020-04-05 DIAGNOSIS — Q828 Other specified congenital malformations of skin: Secondary | ICD-10-CM

## 2020-04-05 DIAGNOSIS — B351 Tinea unguium: Secondary | ICD-10-CM | POA: Diagnosis not present

## 2020-04-05 DIAGNOSIS — L97521 Non-pressure chronic ulcer of other part of left foot limited to breakdown of skin: Secondary | ICD-10-CM | POA: Diagnosis not present

## 2020-04-05 DIAGNOSIS — I739 Peripheral vascular disease, unspecified: Secondary | ICD-10-CM | POA: Diagnosis not present

## 2020-04-05 MED ORDER — DOXYCYCLINE HYCLATE 100 MG PO TABS: 100.0000 mg | ORAL_TABLET | Freq: Two times a day (BID) | ORAL | 0 refills | Status: DC

## 2020-04-05 MED ORDER — LABETALOL HCL 100 MG PO TABS: ORAL_TABLET | ORAL | 1 refills | Status: AC

## 2020-04-05 NOTE — Telephone Encounter (Signed)
-----   Message from Trula Slade, DPM sent at 04/05/2020 10:45 AM EDT ----- Can you please order arterial studies? Has a wound on the left toe. ABI was done in the office today which was read as "PAD"

## 2020-04-05 NOTE — Telephone Encounter (Signed)
Faxed orders to [facility]. 

## 2020-04-06 ENCOUNTER — Other Ambulatory Visit: Payer: Self-pay | Admitting: Podiatry

## 2020-04-06 DIAGNOSIS — I739 Peripheral vascular disease, unspecified: Secondary | ICD-10-CM

## 2020-04-06 NOTE — Progress Notes (Signed)
Subjective: 74 year old female presents the office today for a wound to the left big toe.  She said that she had a callus that she tried to get off herself but it was stuck so she used scissors in order to cut it resulting in the wound.  This occurred over the weekend.  She currently denies any increase in swelling or redness.  Denies any drainage or pus. Denies any systemic complaints such as fevers, chills, nausea, vomiting. No acute changes since last appointment, and no other complaints at this time.   Objective: AAO x3, NAD DP/PT pulses 1/4 bilaterally, CRT less than 3 seconds Ulceration present plantar aspect left hallux which is superficial with a granular wound base measuring 0.5 x 0.4 cm.  This is where she trimmed the callus off.  There is no surrounding erythema, drainage or pus.   The nails are hypertrophic, dystrophic and discolored with yellow-brown discoloration x9.  There is no redness or drainage or signs of infection in the nail sites.  Also hyperkeratotic lesion submetatarsal left foot away from the area of ulceration.  No ongoing ulceration identified.  Previous partial amputation of the right hallux well-healed. No open lesions or pre-ulcerative lesions.  No pain with calf compression, swelling, warmth, erythema  Assessment: 74 year old female with ulceration left hallux, concern for PAD; symptomatic onychomycosis, hyperkeratotic lesions  Plan: -All treatment options discussed with the patient including all alternatives, risks, complications.  -An ABI was done in the office which was read as "PAD".  Given the new ulceration as well as this finding will order arterial studies.  Continue mupirocin ointment dressing changes to the wound daily.  Prescribe doxycycline.  Offloading. -Debrided nails x9 without any complications or bleeding.  Debrided the hyperkeratotic lesion x2 on the left foot any complications or bleeding.  Discussed daily foot inspection and recommended and  stressed the importance of not trying trim the calluses herself. -Patient encouraged to call the office with any questions, concerns, change in symptoms.   Trula Slade DPM

## 2020-04-13 ENCOUNTER — Other Ambulatory Visit: Payer: Self-pay

## 2020-04-13 ENCOUNTER — Ambulatory Visit (HOSPITAL_COMMUNITY)
Admission: RE | Admit: 2020-04-13 | Discharge: 2020-04-13 | Disposition: A | Discharge status: Home / Self Care | Payer: Medicare HMO | Source: Ambulatory Visit | Attending: Cardiovascular Disease | Admitting: Cardiovascular Disease

## 2020-04-13 DIAGNOSIS — I739 Peripheral vascular disease, unspecified: Secondary | ICD-10-CM | POA: Diagnosis not present

## 2020-04-15 ENCOUNTER — Telehealth: Payer: Self-pay | Admitting: *Deleted

## 2020-04-15 NOTE — Telephone Encounter (Signed)
I informed pt of Dr. Leigh Aurora review or results and orders to follow up with Dr. Nathaniel Man 04/27/2020 at 9:00am.

## 2020-04-15 NOTE — Telephone Encounter (Signed)
-----   Message from Trula Slade, DPM sent at 04/14/2020  5:48 PM EDT ----- Val- please let her know that the circulation test was abnormal. She has a follow up with Dr. Fletcher Anon. Please let her know and make sure she is aware of this. Thanks.

## 2020-04-16 ENCOUNTER — Other Ambulatory Visit: Payer: Self-pay | Admitting: Family Medicine

## 2020-04-20 ENCOUNTER — Ambulatory Visit: Payer: Medicare HMO | Admitting: Podiatry

## 2020-04-20 ENCOUNTER — Other Ambulatory Visit: Payer: Self-pay

## 2020-04-20 ENCOUNTER — Other Ambulatory Visit: Payer: Self-pay | Admitting: Family Medicine

## 2020-04-20 ENCOUNTER — Encounter: Payer: Self-pay | Admitting: Podiatry

## 2020-04-20 ENCOUNTER — Telehealth: Payer: Self-pay | Admitting: Podiatry

## 2020-04-20 DIAGNOSIS — E1149 Type 2 diabetes mellitus with other diabetic neurological complication: Secondary | ICD-10-CM

## 2020-04-20 DIAGNOSIS — I739 Peripheral vascular disease, unspecified: Secondary | ICD-10-CM | POA: Diagnosis not present

## 2020-04-20 DIAGNOSIS — L97521 Non-pressure chronic ulcer of other part of left foot limited to breakdown of skin: Secondary | ICD-10-CM

## 2020-04-20 NOTE — Telephone Encounter (Signed)
Patient family member would like to have intermittent FMLA paperwork updated, Please advise?

## 2020-04-21 NOTE — Telephone Encounter (Signed)
That would be OK but her wounds have healed. I am seeing her back in 2 months. I think they need it just to bring her to the appointments.

## 2020-04-26 NOTE — Progress Notes (Signed)
Subjective: 74 year old female presents the office today for a wound to the left big toe. She states that she is doing much better. She thinks the wound is healed and she denies any increase in swelling or redness or drainage or pus. She is on doxycycline. She has a callus on the ball of her foot that she would have trimmed today as well. Denies any drainage or pus. Denies any systemic complaints such as fevers, chills, nausea, vomiting. No acute changes since last appointment, and no other complaints at this time.   Objective: AAO x3, NAD DP/PT pulses 1/4 bilaterally, CRT less than 3 seconds Hyperkeratotic tissue present the plantar aspect the left hallux as well submetatarsal one. Upon debridement of both of it. There is no ongoing ulceration is no drainage or pus. Appears the wound is healed. No open lesions or pre-ulcerative lesions.  No pain with calf compression, swelling, warmth, erythema  Assessment: 74 year old female with healed ulceration, PAD  Plan: -All treatment options discussed with the patient including all alternatives, risks, complications.  -Debrided hyperkeratotic tissue without any complications or bleeding. The wound is healed continue offloading and moisturizer daily. Monitor for any reoccurrence. She still has a follow-up in regards her circulation she has an appointment already scheduled.  Return in about 2 weeks (around 05/04/2020) for diabetic foot check.  Trula Slade DPM

## 2020-04-27 ENCOUNTER — Other Ambulatory Visit: Payer: Self-pay

## 2020-04-27 ENCOUNTER — Encounter: Payer: Self-pay | Admitting: Cardiovascular Disease

## 2020-04-27 ENCOUNTER — Ambulatory Visit: Payer: Medicare HMO | Admitting: Cardiovascular Disease

## 2020-04-27 VITALS — BP 180/78 | HR 82 | Ht 70.0 in | Wt 181.0 lb

## 2020-04-27 DIAGNOSIS — I10 Essential (primary) hypertension: Secondary | ICD-10-CM

## 2020-04-27 DIAGNOSIS — E782 Mixed hyperlipidemia: Secondary | ICD-10-CM

## 2020-04-27 DIAGNOSIS — I739 Peripheral vascular disease, unspecified: Secondary | ICD-10-CM

## 2020-04-27 MED ORDER — EZETIMIBE 10 MG PO TABS
10.0000 mg | ORAL_TABLET | Freq: Every day | ORAL | 3 refills | Status: DC
Start: 2020-04-27 — End: 2020-10-26

## 2020-04-27 NOTE — Patient Instructions (Signed)
Medication Instructions:  START Zetia 10 mg once daily  *If you need a refill on your cardiac medications before your next appointment, please call your pharmacy*   Lab Work: Your provider would like for you to return in 2 months to have the following labs drawn: fasting lipid and liver. You do not need an appointment for the lab. Once in our office lobby there is a podium where you can sign in and ring the doorbell to alert [place] that you are here. The lab is open from 8:00 am to 4:30 pm; closed for lunch from 12:45pm-1:45pm.  If you have labs (blood work) drawn today and your tests are completely normal, you will receive your results only by: Marland Kitchen MyChart Message (if you have MyChart) OR . A paper copy in the mail If you have any lab test that is abnormal or we need to change your treatment, we will call you to review the results.   Testing/Procedures: None ordered   Follow-Up: At [facility], you and your health needs are our priority.  As part of our continuing mission to provide you with exceptional heart care, we have created designated Provider Care Teams.  These Care Teams include your primary Cardiologist (physician) and Advanced Practice Providers (APPs -  Physician Assistants and Nurse Practitioners) who all work together to provide you with the care you need, when you need it.  We recommend signing up for the patient portal called "MyChart".  Sign up information is provided on this After Visit Summary.  MyChart is used to connect with patients for Virtual Visits (Telemedicine).  Patients are able to view lab/test results, encounter notes, upcoming appointments, etc.  Non-urgent messages can be sent to your provider as well.   To learn more about what you can do with MyChart, go to [email or web address].    Your next appointment:   6 month(s)  The format for your next appointment:   In Person  Provider:   Kathlyn Sacramento, MD

## 2020-04-27 NOTE — Progress Notes (Signed)
Cardiology Office Note   Date:  04/27/2020   ID:  Amanda Krueger, DOB 06-May-1946, MRN [id number]  PCP:  Alycia Rossetti, MD  Cardiologist:   Kathlyn Sacramento, MD   No chief complaint on file.     History of Present Illness: Amanda Krueger is a 74 y.o. female who was referred by Dr. Jacqualyn Posey for evaluation and management of peripheral arterial disease with lower extremity ulceration. She has known history of essential hypertension, hyperlipidemia, type 2 diabetes and chronic kidney disease.  She had previous CVA.  She has history of intolerance to statins.  She had diabetes for more than 25 years.  The patient had previous amputation of the right big toe. She developed a wound on the bottom of the left big toe at the site of a callus.  This has gradually improved and it is almost completely healed now.  She underwent vascular studies in April which showed noncompressible vessels bilaterally with abnormal toe pressure.  Duplex showed borderline stenosis in the right external iliac artery with velocities in the 200 range.  There was mild common femoral and SFA disease.  On the left side, there was borderline stenosis in the external iliac artery with velocities in the 200 range.  There was also mild to moderate common femoral artery disease with significant stenosis in the anterior tibial artery followed by short segment occlusion and suspecting the occlusion of the distal posterior tibial artery. Most recent creatinine was 3.61. She reports chronic bilateral leg cramps that improved with magnesium.  Her mobility is limited by severe back pain and she walks slowly with a cane.  She denies chest pain or shortness of breath.  Her blood pressure is elevated today but she reports that she has whitecoat syndrome and also has not taken her antihypertensive medications yet.  Past Medical History:  Diagnosis Date  . Diabetes mellitus   . Diabetes mellitus due to underlying condition with  stage 3 chronic kidney disease, with long-term current use of insulin ([place]) 08/15/2016  . DJD (degenerative joint disease)   . Hyperlipidemia   . Hypertension     Past Surgical History:  Procedure Laterality Date  .  rt toe debridement    . CATARACT EXTRACTION, BILATERAL     right eye-2007, left eye-2012  . COLONOSCOPY N/A 11/01/2018   Procedure: COLONOSCOPY;  Surgeon: Danie Binder, MD;  Location: [facility];  Service: Endoscopy;  Laterality: N/A;  1:00  . EXAM UNDER ANESTHESIA WITH MANIPULATION OF KNEE Left 12/26/2013   Procedure: EXAM UNDER ANESTHESIA WITH MANIPULATION OF KNEE;  Surgeon: Carole Civil, MD;  Location: [facility];  Service: Orthopedics;  Laterality: Left;  . KNEE ARTHROSCOPY Left 12/26/2013   Procedure: ARTHROSCOPY KNEE;  Surgeon: Carole Civil, MD;  Location: [facility];  Service: Orthopedics;  Laterality: Left;  . POLYPECTOMY  11/01/2018   Procedure: POLYPECTOMY;  Surgeon: Danie Binder, MD;  Location: [facility];  Service: Endoscopy;;  . right toe     Great toe  . SYNOVECTOMY Left 12/26/2013   Procedure: EXTENSIVE SYNOVECTOMY LEFT KNEE;  Surgeon: Carole Civil, MD;  Location: [facility];  Service: Orthopedics;  Laterality: Left;  . WOUND DEBRIDEMENT Left 12/26/2013   Procedure: LEFT GREAT TOE CALLOUS DEBRIDEMENT;  Surgeon: Carole Civil, MD;  Location: [facility];  Service: Orthopedics;  Laterality: Left;     Current Outpatient Medications  Medication Sig Dispense Refill  . amLODipine (NORVASC) 10 MG tablet TAKE 1 TABLET  EVERY DAY (NEED MD APPOINTMENT) 90 tablet 0  . aspirin 325 MG tablet Take 1 tablet (325 mg total) by mouth daily. 30 tablet 12  . Blood Glucose Monitoring Suppl (ACCU-CHEK NANO SMARTVIEW) W/DEVICE KIT     . calcitRIOL (ROCALTROL) 0.25 MCG capsule Take 1 capsule (0.25 mcg total) by mouth every other day. 45 capsule 0  . Cholecalciferol (VITAMIN D) 2000 units CAPS Take 2,000 Units by mouth daily.     . cloNIDine (CATAPRES) 0.2 MG tablet TAKE  1 TABLET TWICE DAILY 180 tablet 3  . doxycycline (VIBRA-TABS) 100 MG tablet Take 1 tablet (100 mg total) by mouth 2 (two) times daily. 20 tablet 0  . fish oil-omega-3 fatty acids 1000 MG capsule Take 1 g by mouth 2 (two) times a week.     . furosemide (LASIX) 40 MG tablet Take by mouth.    Marland Kitchen glucose blood test strip USE 1 STRIP TO CHECK GLUCOSE THREE TIMES DAILY AS  INSTRUCTED 100 each 2  . insulin regular (NOVOLIN R RELION) 100 units/mL injection INJECT(0.04-0.06 MLS) 4-6 UNITS SUBCUTANEOUSLY THREE TIMES DAILY BEFORE MEAL(S) 10 mL 3  . iron polysaccharides (NIFEREX) 150 MG capsule Take 150 mg by mouth every other day.     . labetalol (NORMODYNE) 100 MG tablet TAKE 1 TABLET TWICE DAILY.  MAY TAKE ADDITIONAL 100 MG AS NEEDED 270 tablet 1  . LANTUS 100 UNIT/ML injection INJECT 20 UNITS SUBCUTANEOUSLY IN THE MORNING 10 mL 0  . MAGNESIUM OXIDE PO Take 250 mg by mouth daily as needed (cramps).     Marland Kitchen OVER THE COUNTER MEDICATION Apply 1 application topically daily as needed (pain). Hempvana Pain Cream    . oxyCODONE-acetaminophen (PERCOCET/ROXICET) 5-325 MG tablet Take 1 tablet by mouth every 4 (four) hours as needed for severe pain. 30 tablet 0   No current facility-administered medications for this visit.    Allergies:   Statins    Social History:  The patient  reports that she has never smoked. She has never used smokeless tobacco. She reports that she does not drink alcohol or use drugs.   Family History:  The patient's family history includes Colon cancer (age of onset: 86) in her brother; Diabetes in her brother, mother, sister, and sister; Heart disease in her father; Stroke in her father.    ROS:  Please see the history of present illness.   Otherwise, review of systems are positive for none.   All other systems are reviewed and negative.    PHYSICAL EXAM: VS:  BP (!) 180/78   Pulse 82   Ht '5\' 10"'  (1.778 m)   Wt 181 lb (82.1 kg)   SpO2 99%   BMI 25.97 kg/m  , BMI Body mass index is  25.97 kg/m. GEN: Well nourished, well developed, in no acute distress  HEENT: normal  Neck: no JVD, carotid bruits, or masses Cardiac: RRR; no  rubs, or gallops, mild edema .  1 out of 6 systolic murmur at the aortic area Respiratory:  clear to auscultation bilaterally, normal work of breathing GI: soft, nontender, nondistended, + BS MS: no deformity or atrophy  Skin: warm and dry, no rash Neuro:  Strength and sensation are intact Psych: euthymic mood, full affect Vascular: Radial pulses normal bilaterally.  Femoral pulses +2 bilaterally.  Distal pulses are not palpable    EKG:  EKG is ordered today. The ekg ordered today demonstrates normal sinus rhythm with poor R wave progression in the anterior leads and inferior T  wave changes suggestive of ischemia.   Recent Labs: 10/27/2019: ALT 9; BUN 51; Creat 3.61; Hemoglobin 11.4; Platelets 191; Potassium 5.3; Sodium 139    Lipid Panel    Component Value Date/Time   CHOL 221 (H) 10/27/2019 1216   TRIG 104 10/27/2019 1216   HDL 73 10/27/2019 1216   CHOLHDL 3.0 10/27/2019 1216   VLDL 19 01/16/2017 0605   LDLCALC 127 (H) 10/27/2019 1216      Wt Readings from Last 3 Encounters:  04/27/20 181 lb (82.1 kg)  02/25/20 180 lb (81.6 kg)  10/27/19 174 lb 4 oz (79 kg)       PAD Screen 03/19/2017  Previous PAD dx? Yes  Previous surgical procedure? No  Pain with walking? No  Feet/toe relief with dangling? No  Painful, non-healing ulcers? No  Extremities discolored? No      ASSESSMENT AND PLAN:  1.  Peripheral arterial disease: The patient has evidence mostly of below the knee small vessel disease.  Fortunately, the ulceration on the left big toe has healed.  We should reserve angiography for evidence of nonhealing ulceration or rest pain given advanced chronic kidney disease.  Any use of contrast will likely pressure towards the need for dialysis.  Recommend continuing medical therapy for now with close observation.  2.   Hyperlipidemia: Most recent LDL was 127.  Given that he has diabetes and peripheral arterial disease, we have to achieve an LDL below 70.  She has history of intolerance to statins due to myalgia.  I elected to start her on Zetia 10 mg daily.  Repeat lipid and liver profile in 2 months.  If LDL remains above 70, recommend treatment with a PCSK9 inhibitor.  3.  Essential hypertension: Blood pressure is elevated but she reports whitecoat syndrome and also has not taken antihypertensive medications yet.    Disposition:   FU with me in 6 months  Signed,  Kathlyn Sacramento, MD  04/27/2020 9:23 AM    [place]

## 2020-04-30 DIAGNOSIS — N184 Chronic kidney disease, stage 4 (severe): Secondary | ICD-10-CM | POA: Diagnosis not present

## 2020-04-30 DIAGNOSIS — Z79899 Other long term (current) drug therapy: Secondary | ICD-10-CM | POA: Diagnosis not present

## 2020-04-30 DIAGNOSIS — E1129 Type 2 diabetes mellitus with other diabetic kidney complication: Secondary | ICD-10-CM | POA: Diagnosis not present

## 2020-04-30 DIAGNOSIS — E559 Vitamin D deficiency, unspecified: Secondary | ICD-10-CM | POA: Diagnosis not present

## 2020-04-30 DIAGNOSIS — R809 Proteinuria, unspecified: Secondary | ICD-10-CM | POA: Diagnosis not present

## 2020-04-30 DIAGNOSIS — E211 Secondary hyperparathyroidism, not elsewhere classified: Secondary | ICD-10-CM | POA: Diagnosis not present

## 2020-04-30 DIAGNOSIS — E875 Hyperkalemia: Secondary | ICD-10-CM | POA: Diagnosis not present

## 2020-05-03 DIAGNOSIS — E1122 Type 2 diabetes mellitus with diabetic chronic kidney disease: Secondary | ICD-10-CM | POA: Diagnosis not present

## 2020-05-03 DIAGNOSIS — R600 Localized edema: Secondary | ICD-10-CM | POA: Diagnosis not present

## 2020-05-03 DIAGNOSIS — R809 Proteinuria, unspecified: Secondary | ICD-10-CM | POA: Diagnosis not present

## 2020-05-03 DIAGNOSIS — N185 Chronic kidney disease, stage 5: Secondary | ICD-10-CM | POA: Diagnosis not present

## 2020-05-03 DIAGNOSIS — E1129 Type 2 diabetes mellitus with other diabetic kidney complication: Secondary | ICD-10-CM | POA: Diagnosis not present

## 2020-05-03 DIAGNOSIS — E211 Secondary hyperparathyroidism, not elsewhere classified: Secondary | ICD-10-CM | POA: Diagnosis not present

## 2020-05-03 DIAGNOSIS — E875 Hyperkalemia: Secondary | ICD-10-CM | POA: Diagnosis not present

## 2020-06-04 IMAGING — MG DIGITAL SCREENING BILATERAL MAMMOGRAM WITH TOMO AND CAD
6 of 10 series · 6 of 30 positions shown · non-contrast
Comparison: Previous exam(s).

CLINICAL DATA: Screening.

EXAM:
DIGITAL SCREENING BILATERAL MAMMOGRAM WITH TOMO AND CAD

[L MLO synth-2D (1 of 2)]
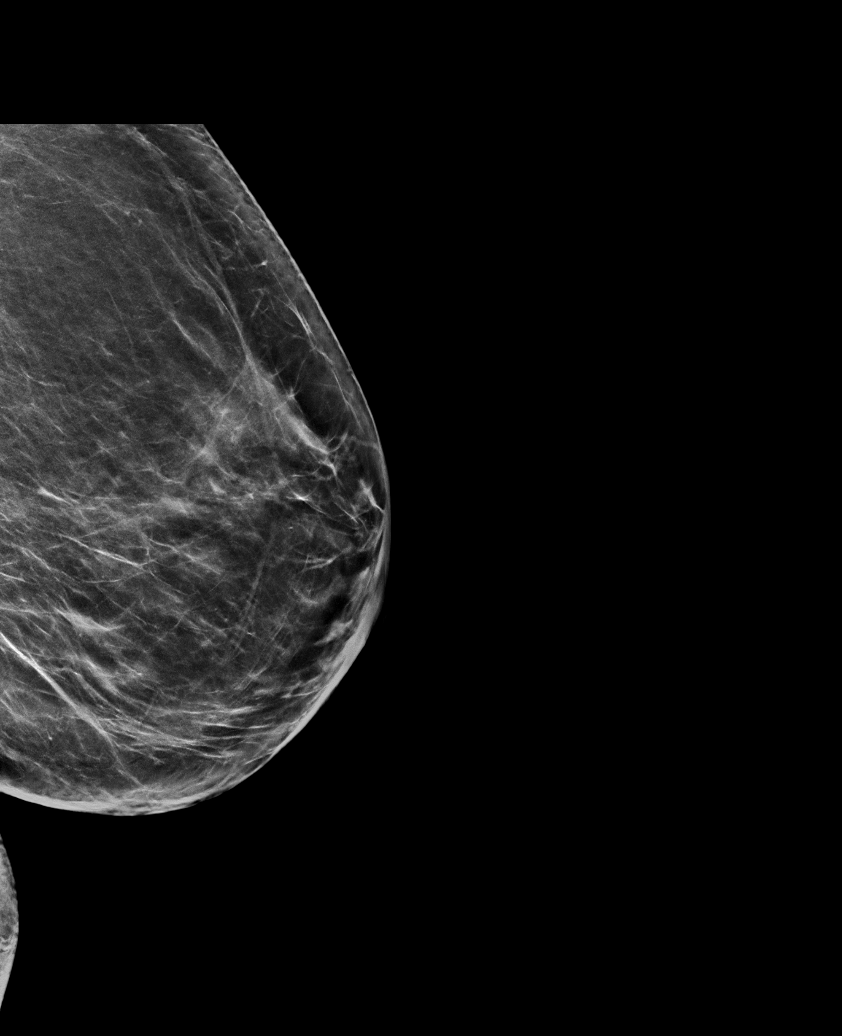

[R MLO synth-2D]
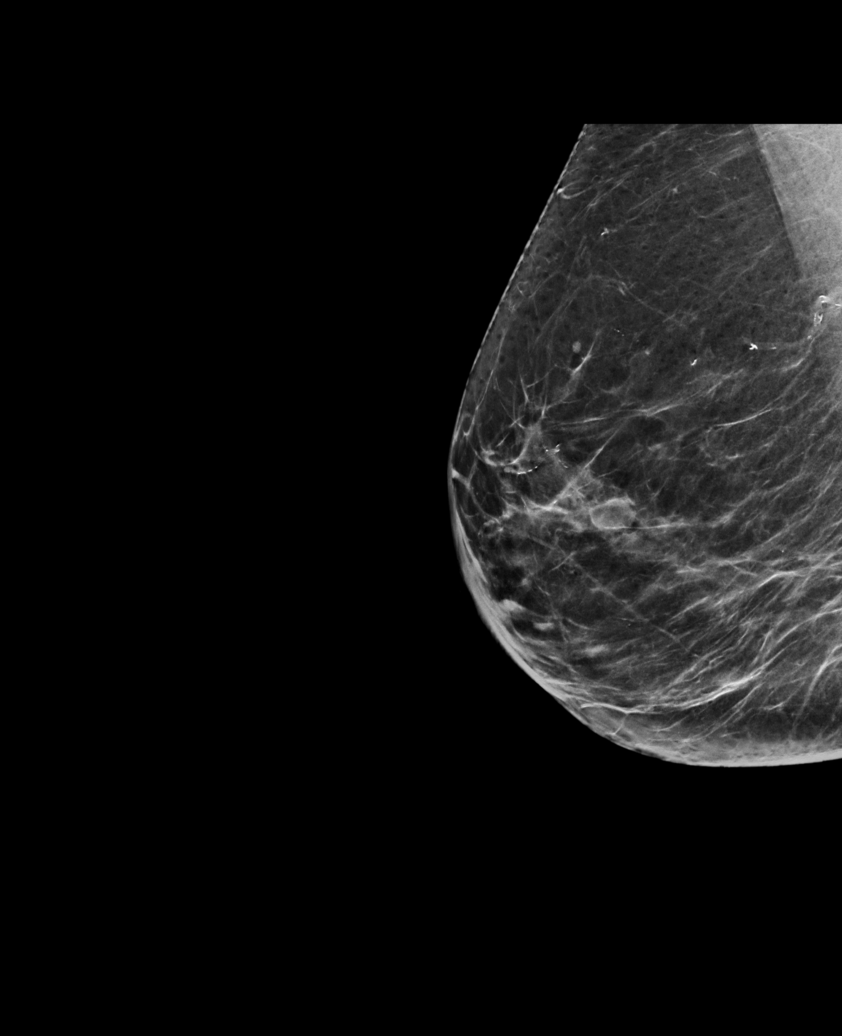

[L CC synth-2D]
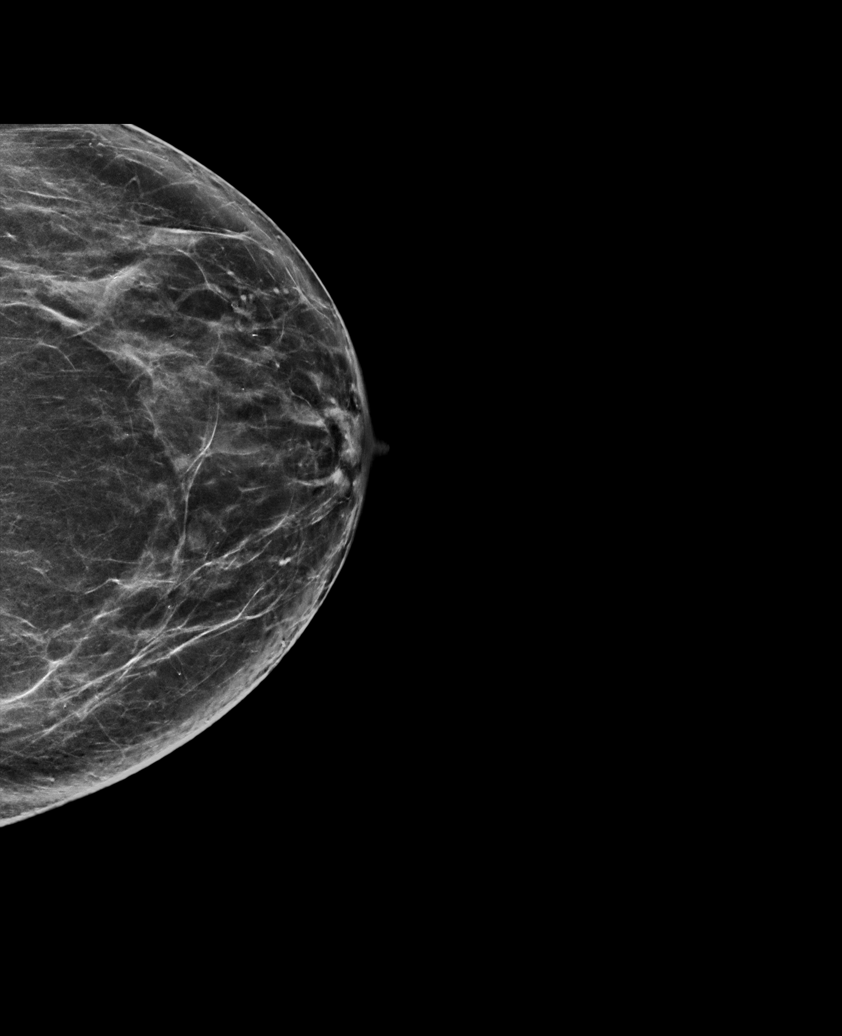

[L MLO synth-2D (2 of 2)]
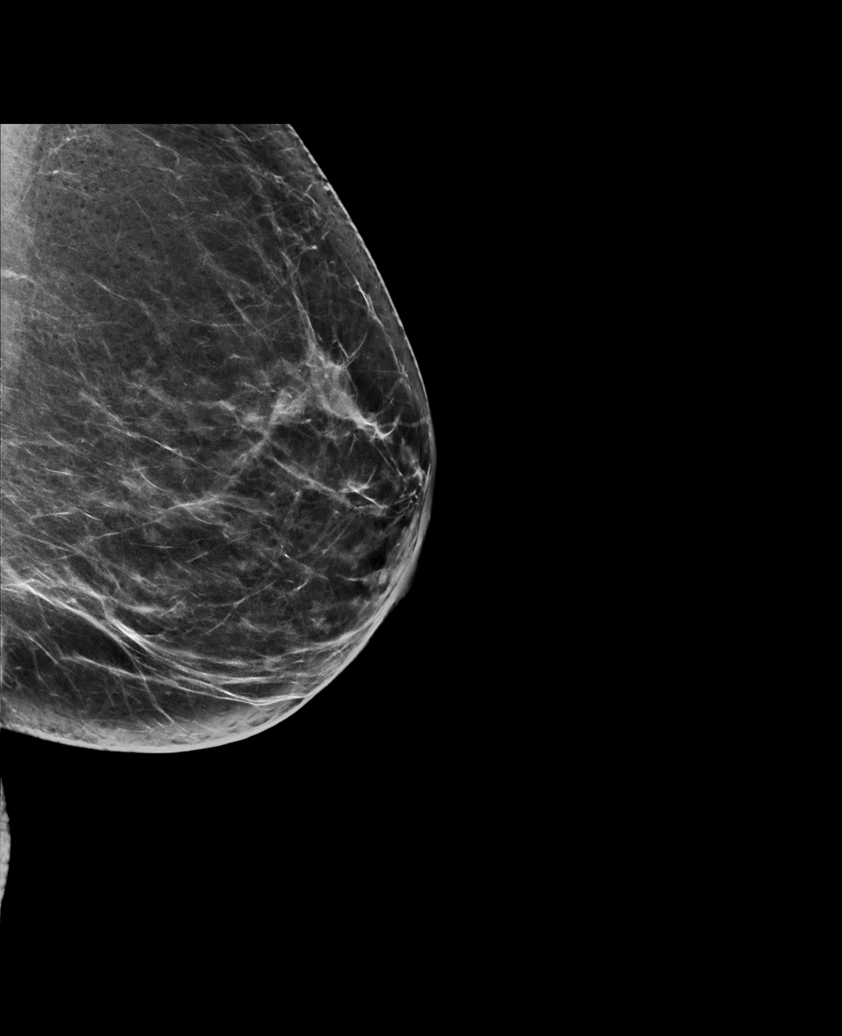

[R CC synth-2D]
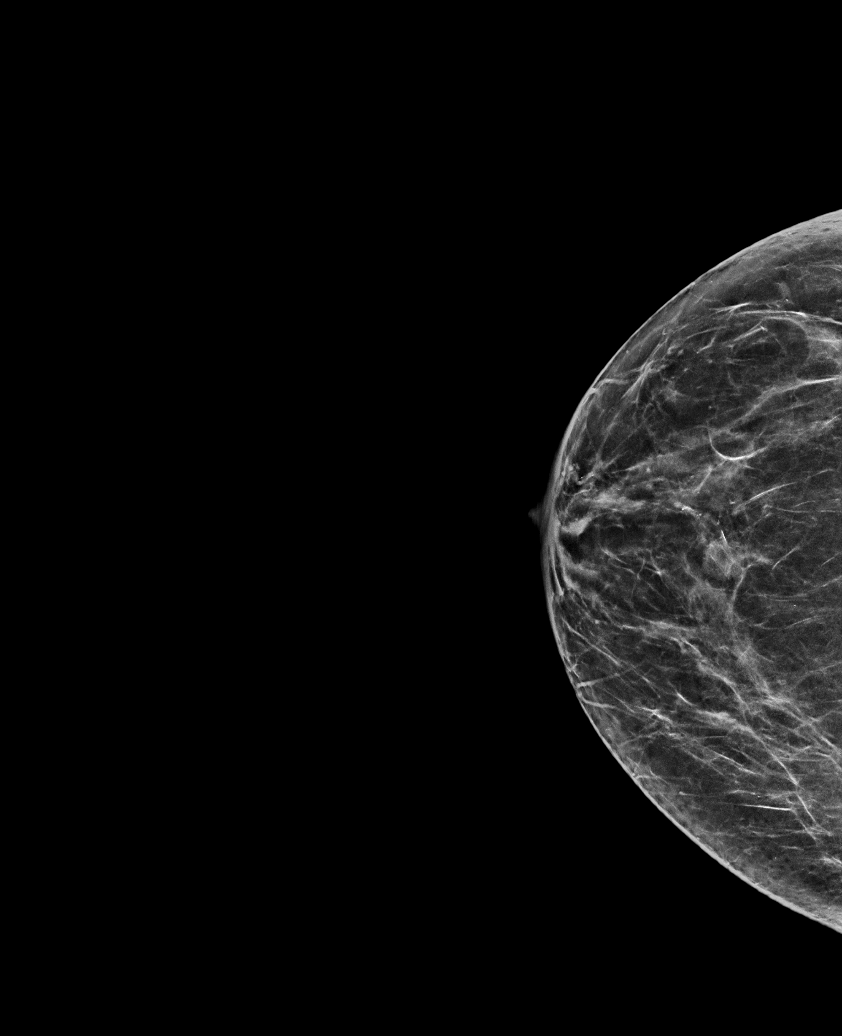

[L CC tomo · tomo slice 37/73.0]
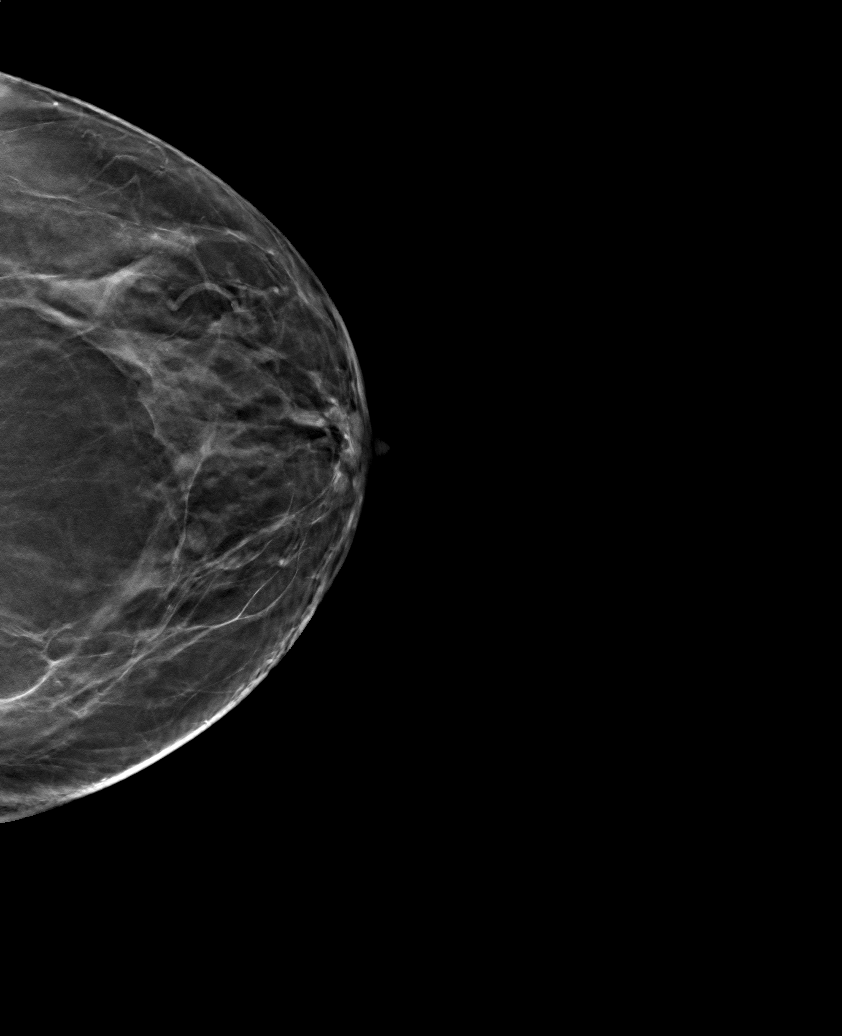

[6 of 30 positions shown; findings below may reference images not displayed]

ACR Breast Density Category b: There are scattered areas of
fibroglandular density.
FINDINGS: There are no findings suspicious for malignancy. Images were
processed with CAD.
IMPRESSION: No mammographic evidence of malignancy. A result letter of this
screening mammogram will be mailed directly to the patient.

RECOMMENDATION:
Screening mammogram in one year. (Code:[id number])

BI-RADS CATEGORY  1: Negative.

## 2020-06-11 ENCOUNTER — Other Ambulatory Visit: Payer: Self-pay | Admitting: Family Medicine

## 2020-06-14 ENCOUNTER — Other Ambulatory Visit: Payer: Self-pay

## 2020-06-14 MED ORDER — AMLODIPINE BESYLATE 10 MG PO TABS: ORAL_TABLET | ORAL | 2 refills | Status: DC

## 2020-06-18 ENCOUNTER — Other Ambulatory Visit: Payer: Self-pay | Admitting: Family Medicine

## 2020-06-28 ENCOUNTER — Ambulatory Visit (INDEPENDENT_AMBULATORY_CARE_PROVIDER_SITE_OTHER): Payer: Medicare HMO | Admitting: Family Medicine

## 2020-06-28 ENCOUNTER — Telehealth: Payer: Self-pay | Admitting: Family Medicine

## 2020-06-28 ENCOUNTER — Encounter: Payer: Self-pay | Admitting: Family Medicine

## 2020-06-28 ENCOUNTER — Other Ambulatory Visit: Payer: Self-pay

## 2020-06-28 VITALS — BP 132/68 | HR 88 | Temp 98.1°F | Resp 14 | Ht 70.0 in | Wt 176.0 lb

## 2020-06-28 DIAGNOSIS — M5136 Other intervertebral disc degeneration, lumbar region: Secondary | ICD-10-CM | POA: Diagnosis not present

## 2020-06-28 DIAGNOSIS — Z8673 Personal history of transient ischemic attack (TIA), and cerebral infarction without residual deficits: Secondary | ICD-10-CM

## 2020-06-28 DIAGNOSIS — N185 Chronic kidney disease, stage 5: Secondary | ICD-10-CM

## 2020-06-28 DIAGNOSIS — I10 Essential (primary) hypertension: Secondary | ICD-10-CM

## 2020-06-28 DIAGNOSIS — E1365 Other specified diabetes mellitus with hyperglycemia: Secondary | ICD-10-CM | POA: Diagnosis not present

## 2020-06-28 DIAGNOSIS — IMO0002 Reserved for concepts with insufficient information to code with codable children: Secondary | ICD-10-CM

## 2020-06-28 DIAGNOSIS — E1322 Other specified diabetes mellitus with diabetic chronic kidney disease: Secondary | ICD-10-CM | POA: Diagnosis not present

## 2020-06-28 DIAGNOSIS — E785 Hyperlipidemia, unspecified: Secondary | ICD-10-CM | POA: Diagnosis not present

## 2020-06-28 NOTE — Assessment & Plan Note (Signed)
Recheck A1C goal < 8% consistently No change to lantus/novolog Titrate based on labs in setting of CKD

## 2020-06-28 NOTE — Assessment & Plan Note (Signed)
bp controlled in office, no changes

## 2020-06-28 NOTE — Progress Notes (Signed)
   Subjective:    Patient ID: Amanda Krueger, female    DOB: 06-16-1946, 74 y.o.   MRN: [id number]  Patient presents for Follow-up (is not fasting)  Patient here to follow-up chronic medical problems.  Medications reviewed.  She Is followed by multiple specialists.  Diabetes mellitus last A1c 9% currently on Lantus16 and NovoLog 5 units twice a day.    States CBG have been high 150-160 fasting , did not bring meter    CKD stage 5 she is has been followed by nephrology.  She also has secondary hyperparathyroidism is on calcitriol vitamin D Anemia with iron deficiency and anemia of renal disease  she declines HD   HTN- she states  BP has been high  she is taking clonidine labetalol and Norvasc, lasix 40mg  BID   Hyperlipidemia does not tolerate any statin.  She takes fish oil. Zetia 10mg  one daily which was given to her by cardiology , Feels like her arthritis has been flaring  In her back too as she has been sleeping beneath fan but she thought it was due to the zetia   Her stroke she is on full dose aspirin   Podiatry Dr. Clayborne Artist    Review Of Systems:  GEN- denies fatigue, fever, weight loss,weakness, recent illness HEENT- denies eye drainage, change in vision, nasal discharge, CVS- denies chest pain, palpitations RESP- denies SOB, cough, wheeze ABD- denies N/V, change in stools, abd pain GU- denies dysuria, hematuria, dribbling, incontinence MSK- + joint pain, muscle aches, injury Neuro- denies headache, dizziness, syncope, seizure activity       Objective:    BP 132/68   Pulse 88   Temp 98.1 F (36.7 C) (Temporal)   Resp 14   Ht 5\' 10"  (1.778 m)   Wt 176 lb (79.8 kg)   SpO2 98%   BMI 25.25 kg/m  GEN- NAD, alert and oriented x3,walks with assistance  HEENT- PERRL, EOMI, non injected sclera, pink conjunctiva,  Neck- Supple, no thyromegaly CVS- RRR, no murmur RESP-CTAB ABD-NABS,soft,NT,ND, no CVA tenderness  MKS- fair ROM spine, Lumbar spine NT  EXT-trace  ankle edema bilat  Pulses- Radial palpated, DP-decreased bilaterally        Assessment & Plan:      Problem List Items Addressed This Visit      Unprioritized   CKD (chronic kidney disease) stage 5, GFR less than 15 ml/min (HCC)   DDD (degenerative disc disease), lumbar    Prn tylenol, take 1/2 tab of the oxycodone as this causes GI upset if she takes full tablet      History of stroke   HTN (hypertension) - Primary    bp controlled in office, no changes       Relevant Orders   CBC with Differential/Platelet   Comprehensive metabolic panel   Hyperlipidemia    Recommend she keep taking zetia, until reviewed by cardiology      Uncontrolled secondary diabetes mellitus with stage 5 CKD (GFR<15) ([place])    Recheck A1C goal < 8% consistently No change to lantus/novolog Titrate based on labs in setting of CKD      Relevant Orders   Hemoglobin A1c      Note: This dictation was prepared with Dragon dictation along with smaller phrase technology. Any transcriptional errors that result from this process are unintentional.

## 2020-06-28 NOTE — Assessment & Plan Note (Signed)
Prn tylenol, take 1/2 tab of the oxycodone as this causes GI upset if she takes full tablet

## 2020-06-28 NOTE — Assessment & Plan Note (Signed)
Recommend she keep taking zetia, until reviewed by cardiology

## 2020-06-28 NOTE — Patient Instructions (Addendum)
F/u 4 MONTHS FOR PHYSICAL

## 2020-06-28 NOTE — Progress Notes (Signed)
  Chronic Care Management   Note  06/28/2020 Name: Amanda Krueger MRN: [id number] DOB: 07-Feb-1946  Amanda Krueger is a 74 y.o. year old female who is a primary care patient of [facility], Modena Nunnery, MD. I reached out to Amanda Krueger by phone today in response to a referral sent by Amanda Krueger's PCP, Buelah Manis, Modena Nunnery, MD.   Amanda Krueger was given information about Chronic Care Management services today including:  1. CCM service includes personalized support from designated clinical staff supervised by her physician, including individualized plan of care and coordination with other care providers 2. 24/7 contact phone numbers for assistance for urgent and routine care needs. 3. Service will only be billed when office clinical staff spend 20 minutes or more in a month to coordinate care. 4. Only one practitioner may furnish and bill the service in a calendar month. 5. The patient may stop CCM services at any time (effective at the end of the month) by phone call to the office staff.   Patient agreed to services and verbal consent obtained.   Follow up plan:  [place]

## 2020-06-29 ENCOUNTER — Encounter: Payer: Self-pay | Admitting: Podiatry

## 2020-06-29 ENCOUNTER — Ambulatory Visit: Payer: Medicare HMO | Admitting: Podiatry

## 2020-06-29 ENCOUNTER — Other Ambulatory Visit: Payer: Self-pay

## 2020-06-29 DIAGNOSIS — Q828 Other specified congenital malformations of skin: Secondary | ICD-10-CM | POA: Diagnosis not present

## 2020-06-29 DIAGNOSIS — M79676 Pain in unspecified toe(s): Secondary | ICD-10-CM

## 2020-06-29 DIAGNOSIS — E1149 Type 2 diabetes mellitus with other diabetic neurological complication: Secondary | ICD-10-CM | POA: Diagnosis not present

## 2020-06-29 DIAGNOSIS — L84 Corns and callosities: Secondary | ICD-10-CM

## 2020-06-29 DIAGNOSIS — B351 Tinea unguium: Secondary | ICD-10-CM | POA: Diagnosis not present

## 2020-06-29 LAB — COMPREHENSIVE METABOLIC PANEL
AG Ratio: 1.1 (calc) (ref 1.0–2.5)
ALT: 15 U/L (ref 6–29)
AST: 16 U/L (ref 10–35)
Albumin: 3.9 g/dL (ref 3.6–5.1)
Alkaline phosphatase (APISO): 85 U/L (ref 37–153)
BUN/Creatinine Ratio: 18 (calc) (ref 6–22)
BUN: 69 mg/dL — ABNORMAL HIGH (ref 7–25)
CO2: 28 mmol/L (ref 20–32)
Calcium: 9.2 mg/dL (ref 8.6–10.4)
Chloride: 100 mmol/L (ref 98–110)
Creat: 3.73 mg/dL — ABNORMAL HIGH (ref 0.60–0.93)
Globulin: 3.5 g/dL (calc) (ref 1.9–3.7)
Glucose, Bld: 149 mg/dL — ABNORMAL HIGH (ref 65–99)
Potassium: 5.1 mmol/L (ref 3.5–5.3)
Sodium: 138 mmol/L (ref 135–146)
Total Bilirubin: 0.4 mg/dL (ref 0.2–1.2)
Total Protein: 7.4 g/dL (ref 6.1–8.1)

## 2020-06-29 LAB — CBC WITH DIFFERENTIAL/PLATELET
Absolute Monocytes: 328 cells/uL (ref 200–950)
Basophils Absolute: 50 cells/uL (ref 0–200)
Basophils Relative: 0.8 %
Eosinophils Absolute: 183 cells/uL (ref 15–500)
Eosinophils Relative: 2.9 %
HCT: 35.1 % (ref 35.0–45.0)
Hemoglobin: 11.5 g/dL — ABNORMAL LOW (ref 11.7–15.5)
Lymphs Abs: 1670 cells/uL (ref 850–3900)
MCH: 29 pg (ref 27.0–33.0)
MCHC: 32.8 g/dL (ref 32.0–36.0)
MCV: 88.4 fL (ref 80.0–100.0)
MPV: 11.8 fL (ref 7.5–12.5)
Monocytes Relative: 5.2 %
Neutro Abs: 4070 cells/uL (ref 1500–7800)
Neutrophils Relative %: 64.6 %
Platelets: 208 10*3/uL (ref 140–400)
RBC: 3.97 10*6/uL (ref 3.80–5.10)
RDW: 13.9 % (ref 11.0–15.0)
Total Lymphocyte: 26.5 %
WBC: 6.3 10*3/uL (ref 3.8–10.8)

## 2020-06-29 LAB — HEMOGLOBIN A1C
Hgb A1c MFr Bld: 9.7 % of total Hgb — ABNORMAL HIGH (ref <5.7)
Mean Plasma Glucose: 232 (calc)
eAG (mmol/L): 12.8 (calc)

## 2020-06-30 NOTE — Progress Notes (Signed)
Subjective: 74 y.o. returns the office today for painful, elongated, thickened toenails which she cannot trim herself as well as for callus in the left big toe.  She denies any open sores or any other concerns today. Denies any redness or drainage around the nails. Denies any acute changes since last appointment and no new complaints today. Denies any systemic complaints such as fevers, chills, nausea, vomiting.   PCP: Alycia Rossetti, MD   Objective: AAO 3, NAD DP/PT pulses palpable, CRT less than 3 seconds Protective sensation decreased with Simms Weinstein monofilament, Achilles tendon reflex intact.  Nails hypertrophic, dystrophic, elongated, brittle, discolored 9. There is tenderness overlying the nails 1-5 on the left and 2 through 5 on the right. There is no surrounding erythema or drainage along the nail sites. Hyperkeratotic lesion left plantar hallux without underlying ulceration drainage or signs of infection. No open lesions or pre-ulcerative lesions are identified. No other areas of tenderness bilateral lower extremities. No overlying edema, erythema, increased warmth. No pain with calf compression, swelling, warmth, erythema.  Assessment: Patient presents with symptomatic onychomycosis; preulcerative callus  Plan: -Treatment options including alternatives, risks, complications were discussed -Nails sharply debrided 9 without complication/bleeding. -Hyperkeratotic lesion sharp debrided x1 without any complications or bleeding -Discussed daily foot inspection. If there are any changes, to call the office immediately.  -Follow-up in 3 months or sooner if any problems are to arise. In the meantime, encouraged to call the office with any questions, concerns, changes symptoms.  Celesta Gentile, DPM

## 2020-07-01 ENCOUNTER — Other Ambulatory Visit: Payer: Self-pay | Admitting: *Deleted

## 2020-07-01 MED ORDER — INSULIN GLARGINE 100 UNIT/ML ~~LOC~~ SOLN: 18.0000 [IU] | Freq: Every day | SUBCUTANEOUS | 0 refills | Status: DC

## 2020-07-28 DIAGNOSIS — E875 Hyperkalemia: Secondary | ICD-10-CM | POA: Diagnosis not present

## 2020-07-28 DIAGNOSIS — N184 Chronic kidney disease, stage 4 (severe): Secondary | ICD-10-CM | POA: Diagnosis not present

## 2020-07-28 DIAGNOSIS — E1129 Type 2 diabetes mellitus with other diabetic kidney complication: Secondary | ICD-10-CM | POA: Diagnosis not present

## 2020-07-28 DIAGNOSIS — E211 Secondary hyperparathyroidism, not elsewhere classified: Secondary | ICD-10-CM | POA: Diagnosis not present

## 2020-07-28 DIAGNOSIS — E782 Mixed hyperlipidemia: Secondary | ICD-10-CM | POA: Diagnosis not present

## 2020-07-28 DIAGNOSIS — R809 Proteinuria, unspecified: Secondary | ICD-10-CM | POA: Diagnosis not present

## 2020-07-28 DIAGNOSIS — I10 Essential (primary) hypertension: Secondary | ICD-10-CM | POA: Diagnosis not present

## 2020-07-28 DIAGNOSIS — R600 Localized edema: Secondary | ICD-10-CM | POA: Diagnosis not present

## 2020-07-28 NOTE — Chronic Care Management (AMB) (Signed)
Chronic Care Management Pharmacy  Name: Amanda Krueger  MRN: [id number] DOB: 1946-03-18  Chief Complaint/ HPI  Amanda Krueger,  74 y.o. , female presents for their Initial CCM visit with the clinical pharmacist In office.  PCP : Alycia Rossetti, MD  Their chronic conditions include: HTN, diabetes, stage 5 CKD, hyperlipidemia, history of stroke.   Financial Resource Strain: Low Risk   . Difficulty of Paying Living Expenses: Not very hard    Medications: Outpatient Encounter Medications as of 07/29/2020  Medication Sig  . amLODipine (NORVASC) 10 MG tablet TAKE 1 TABLET EVERY DAY (NEED MD APPOINTMENT)  . aspirin 325 MG tablet Take 1 tablet (325 mg total) by mouth daily.  . Blood Glucose Monitoring Suppl (ACCU-CHEK NANO SMARTVIEW) W/DEVICE KIT   . calcitRIOL (ROCALTROL) 0.25 MCG capsule Take 1 capsule (0.25 mcg total) by mouth every other day.  . Cholecalciferol (VITAMIN D) 2000 units CAPS Take 2,000 Units by mouth daily.   . cloNIDine (CATAPRES) 0.2 MG tablet TAKE 1 TABLET TWICE DAILY  . fish oil-omega-3 fatty acids 1000 MG capsule Take 1 g by mouth 2 (two) times a week.   . furosemide (LASIX) 40 MG tablet Take by mouth daily.   Marland Kitchen glucose blood test strip USE 1 STRIP TO CHECK GLUCOSE THREE TIMES DAILY AS  INSTRUCTED  . insulin glargine (LANTUS) 100 UNIT/ML injection Inject 0.18 mLs (18 Units total) into the skin daily.  . insulin regular (NOVOLIN R RELION) 100 units/mL injection INJECT(0.04-0.06 MLS) 4-6 UNITS SUBCUTANEOUSLY THREE TIMES DAILY BEFORE MEAL(S)  . iron polysaccharides (NIFEREX) 150 MG capsule Take 150 mg by mouth every other day.   . labetalol (NORMODYNE) 100 MG tablet TAKE 1 TABLET TWICE DAILY.  MAY TAKE ADDITIONAL 100 MG AS NEEDED  . MAGNESIUM OXIDE PO Take 250 mg by mouth daily as needed (cramps).   Marland Kitchen OVER THE COUNTER MEDICATION Apply 1 application topically daily as needed (pain). Hempvana Pain Cream  . oxyCODONE-acetaminophen (PERCOCET/ROXICET) 5-325 MG tablet  Take 1 tablet by mouth every 4 (four) hours as needed for severe pain.  Marland Kitchen ezetimibe (ZETIA) 10 MG tablet Take 1 tablet (10 mg total) by mouth daily.   No facility-administered encounter medications on file as of 07/29/2020.     Current Diagnosis/Assessment:  Goals Addressed            This Visit's Progress   . Pharmacy Care Plan:       CARE PLAN ENTRY (see longitudinal plan of care for additional care plan information)  Current Barriers:  . Chronic Disease Management support, education, and care coordination needs related to Hypertension, Hyperlipidemia, and Diabetes   Hypertension BP Readings from Last 3 Encounters:  06/28/20 132/68  04/27/20 (!) 180/78  02/25/20 130/64   . Pharmacist Clinical Goal(s): o Over the next 90 days, patient will work with PharmD and providers to maintain BP goal <130/80 . Current regimen:  o Amlodipine 17m o Clonidine 0.259mo Labetolol 10028mid . Interventions: o Reviewed home blood pressure monitoring o Discussed importance of medication adherence . Patient self care activities - Over the next 90 days, patient will: o Check BP daily, document, and provide at future appointments o Ensure daily salt intake < 2300 mg/day  Hyperlipidemia Lab Results  Component Value Date/Time   LDLCALC 127 (H) 10/27/2019 12:16 PM   . Pharmacist Clinical Goal(s): o Over the next 90 days, patient will work with PharmD and providers to achieve LDL goal < 70 . Current regimen:  o Ezetimibe 15m daily . Interventions: o Discussed importance of medication adherence o Counseled on diet to bring LDL towards goal . Patient self care activities - Over the next 90 days, patient will: o Take medication as directed o Work on diet low in saturated fats to bring LDL towards goal.  Diabetes Lab Results  Component Value Date/Time   HGBA1C 9.7 (H) 06/28/2020 11:28 AM   HGBA1C 9.0 (H) 02/25/2020 12:28 PM   HGBA1C 8.1 (H) 10/27/2019 12:16 PM   HGBA1C 7.7 (H)  10/22/2017 03:34 PM   . Pharmacist Clinical Goal(s): o Over the next 90 days, patient will work with PharmD and providers to achieve A1c goal <7% . Current regimen:  o Lantus 18units daily o Novolin R 5 units bid . Interventions: o Reviewed home blood sugar monitoring o Counseled on medication adherence . Patient self care activities - Over the next 90 days, patient will: o Check blood sugar once daily, document, and provide at future appointments o Contact provider with any episodes of hypoglycemia o Work on taking doses of meal time insulin whether at home or on the go.   Initial goal documentation       Hypertension   BP goal is:  < 130/80  Office blood pressures are  BP Readings from Last 3 Encounters:  06/28/20 132/68  04/27/20 (!) 180/78  02/25/20 130/64   Patient checks BP at home daily Patient home BP readings are ranging: 130/60-70  Patient has failed these meds in the past: none noted Patient is currently controlled on the following medications:  . Amlodipine 144m. Clonidine 0.70m470m. Labetolol 100m26md  Reports adherence with these three medications.  Denies swelling.  Office BP and home BP readings controlled.  No adverse effects.  Plan  Continue current medications      Diabetes   Recent Relevant Labs: Lab Results  Component Value Date/Time   HGBA1C 9.7 (H) 06/28/2020 11:28 AM   HGBA1C 9.0 (H) 02/25/2020 12:28 PM   HGBA1C 8.1 (H) 10/27/2019 12:16 PM   HGBA1C 7.7 (H) 10/22/2017 03:34 PM   MICROALBUR 84.1 06/16/2016 03:55 PM   MICROALBUR 106.5 (H) 07/12/2015 03:18 PM     Checking BG: Daily  Recent FBG Readings: 111-150  Patient has failed these meds in past: none noted Patient is currently uncontrolled on the following medications:   Lantus 18 units daily  Novolin R 5 units bid  Last diabetic Foot exam:  Lab Results  Component Value Date/Time   HMDIABEYEEXA Retinopathy (A) 09/11/2018 12:00 AM   HMDIABEYEEXA Retinopathy (A)  09/11/2018 12:00 AM    Last diabetic Eye exam: No results found for: HMDIABFOOTEX   Patient admits to not taking her meal time insulin around the time of her last visit in July when A1c was 9.7.  Counseled on importance of taking medications as directed.  Counseled on diet low in carbohydrates and sugars.  Suspect non adherence combined with diet have led to increase in her sugars.  Has follow up with PCP next week and will bring in meter for accurate reporting.    Plan  Continue current medications, adjust insulin dose based on A1c and readings from meter at next weeks visit.   CKD   Kidney Function Lab Results  Component Value Date/Time   CREATININE 3.73 (H) 06/28/2020 11:28 AM   CREATININE 3.61 (H) 10/27/2019 12:16 PM   GFRNONAA 29 (L) 01/18/2017 05:31 AM   GFRNONAA 29 (L) 10/20/2016 02:29 PM   GFRAA 33 (L)  01/18/2017 05:31 AM   GFRAA 33 (L) 10/20/2016 02:29 PM   K 5.1 06/28/2020 11:28 AM   K 5.3 10/27/2019 12:16 PM   Evaluated medication profile for safety based on current renal function.  Plan  Continue current medications  Hyperlipidemia   LDL goal < 70  Lipid Panel     Component Value Date/Time   CHOL 189 07/28/2020 1104   TRIG 84 07/28/2020 1104   HDL 60 07/28/2020 1104   LDLCALC 114 (H) 07/28/2020 1104   LDLCALC 127 (H) 10/27/2019 1216    Hepatic Function Latest Ref Rng & Units 07/28/2020 06/28/2020 10/27/2019  Total Protein 6.0 - 8.5 g/dL 7.0 7.4 7.9  Albumin 3.7 - 4.7 g/dL 4.0 - -  AST 0 - 40 IU/L _0 ALT 0 - 32 IU/L _1 Alk Phosphatase 48 - 121 IU/L 93 - -  Total Bilirubin 0.0 - 1.2 mg/dL 0.2 0.4 0.5  Bilirubin, Direct 0.00 - 0.40 mg/dL 0.08 - -     The ASCVD Risk score (Strafford., et al., 2013) failed to calculate for the following reasons:   The patient has a prior MI or stroke diagnosis   Patient has failed these meds in past: statins (muscle pain) Patient is currently uncontrolled on the following medications:  . Ezetimibe  4m  Patient admits to breaking tablet in half because she thinks it may be causing muscle pains.  Counseled on importance of controlling cholesterol especially with her stroke history.  Counseled on diet modifications she can make to bring these down. Plan  Continue current medications, recheck lipids. History of Stroke   Patient has failed these meds in past: none noted Patient is currently controlled on the following medications:  . ASA 3268mdaily  No abnormal bruising or bleeding.  Working to control other risk factors.  LDL not at goal.  Plan  Continue current medications  Vaccines   Reviewed and discussed patient's vaccination history.    Immunization History  Administered Date(s) Administered  . Pneumococcal Conjugate-13 11/07/2013  . Pneumococcal Polysaccharide-23 02/19/2015    Plan  Recommended patient receive Shingles vaccine in office.  Medication Management   . Miscellaneous medications: Furosemide 4072mOxycodone/APAP 5-325m48mOTC's: ASA 325mg86mtamin D 2000u . Patient currently uses [facility] #  [phone] reports using vials method to organize medications and promote adherence. . Patient denies missed doses of medication.   ChrisBeverly MilchrmD Clinical Pharmacist [place])[phone]

## 2020-07-29 ENCOUNTER — Ambulatory Visit: Payer: Medicare HMO | Admitting: Pharmacist

## 2020-07-29 ENCOUNTER — Other Ambulatory Visit: Payer: Self-pay

## 2020-07-29 DIAGNOSIS — I10 Essential (primary) hypertension: Secondary | ICD-10-CM

## 2020-07-29 DIAGNOSIS — E1322 Other specified diabetes mellitus with diabetic chronic kidney disease: Secondary | ICD-10-CM

## 2020-07-29 DIAGNOSIS — IMO0002 Reserved for concepts with insufficient information to code with codable children: Secondary | ICD-10-CM

## 2020-07-29 DIAGNOSIS — E785 Hyperlipidemia, unspecified: Secondary | ICD-10-CM

## 2020-07-29 LAB — HEPATIC FUNCTION PANEL
ALT: 14 IU/L (ref 0–32)
AST: 15 IU/L (ref 0–40)
Albumin: 4 g/dL (ref 3.7–4.7)
Alkaline Phosphatase: 93 IU/L (ref 48–121)
Bilirubin Total: 0.2 mg/dL (ref 0.0–1.2)
Bilirubin, Direct: 0.08 mg/dL (ref 0.00–0.40)
Total Protein: 7 g/dL (ref 6.0–8.5)

## 2020-07-29 LAB — LIPID PANEL
Chol/HDL Ratio: 3.2 ratio (ref 0.0–4.4)
Cholesterol, Total: 189 mg/dL (ref 100–199)
HDL: 60 mg/dL (ref >39)
LDL Chol Calc (NIH): 114 mg/dL — ABNORMAL HIGH (ref 0–99)
Triglycerides: 84 mg/dL (ref 0–149)
VLDL Cholesterol Cal: 15 mg/dL (ref 5–40)

## 2020-07-29 NOTE — Patient Instructions (Addendum)
Visit Information Thank you for meeting with me today!  I look forward to working with you to help you meet all of your healthcare goals and answer any questions you may have.  Feel free to contact me anytime!  Goals Addressed            This Visit's Progress   . Pharmacy Care Plan:       CARE PLAN ENTRY (see longitudinal plan of care for additional care plan information)  Current Barriers:  . Chronic Disease Management support, education, and care coordination needs related to Hypertension, Hyperlipidemia, and Diabetes   Hypertension BP Readings from Last 3 Encounters:  06/28/20 132/68  04/27/20 (!) 180/78  02/25/20 130/64   . Pharmacist Clinical Goal(s): o Over the next 90 days, patient will work with PharmD and providers to maintain BP goal <130/80 . Current regimen:  o Amlodipine 10mg  o Clonidine 0.2mg  o Labetolol 100mg  bid . Interventions: o Reviewed home blood pressure monitoring o Discussed importance of medication adherence . Patient self care activities - Over the next 90 days, patient will: o Check BP daily, document, and provide at future appointments o Ensure daily salt intake < 2300 mg/day  Hyperlipidemia Lab Results  Component Value Date/Time   LDLCALC 127 (H) 10/27/2019 12:16 PM   . Pharmacist Clinical Goal(s): o Over the next 90 days, patient will work with PharmD and providers to achieve LDL goal < 70 . Current regimen:  o Ezetimibe 10mg  daily . Interventions: o Discussed importance of medication adherence o Counseled on diet to bring LDL towards goal . Patient self care activities - Over the next 90 days, patient will: o Take medication as directed o Work on diet low in saturated fats to bring LDL towards goal.  Diabetes Lab Results  Component Value Date/Time   HGBA1C 9.7 (H) 06/28/2020 11:28 AM   HGBA1C 9.0 (H) 02/25/2020 12:28 PM   HGBA1C 8.1 (H) 10/27/2019 12:16 PM   HGBA1C 7.7 (H) 10/22/2017 03:34 PM   . Pharmacist Clinical  Goal(s): o Over the next 90 days, patient will work with PharmD and providers to achieve A1c goal <7% . Current regimen:  o Lantus 18units daily o Novolin R 5 units bid . Interventions: o Reviewed home blood sugar monitoring o Counseled on medication adherence . Patient self care activities - Over the next 90 days, patient will: o Check blood sugar once daily, document, and provide at future appointments o Contact provider with any episodes of hypoglycemia o Work on taking doses of meal time insulin whether at home or on the go.   Initial goal documentation        Ms. Nethery was given information about Chronic Care Management services today including:  1. CCM service includes personalized support from designated clinical staff supervised by her physician, including individualized plan of care and coordination with other care providers 2. 24/7 contact phone numbers for assistance for urgent and routine care needs. 3. Standard insurance, coinsurance, copays and deductibles apply for chronic care management only during months in which we provide at least 20 minutes of these services. Most insurances cover these services at 100%, however patients may be responsible for any copay, coinsurance and/or deductible if applicable. This service may help you avoid the need for more expensive face-to-face services. 4. Only one practitioner may furnish and bill the service in a calendar month. 5. The patient may stop CCM services at any time (effective at the end of the month) by phone call to the  office staff.  Patient agreed to services and verbal consent obtained.   The patient verbalized understanding of instructions provided today and agreed to receive a mailed copy of patient instruction and/or educational materials. Telephone follow up appointment with pharmacy team member scheduled for: 6 months  Beverly Milch, PharmD Clinical Pharmacist [place] [phone]   Carbohydrate Counting for Diabetes Mellitus, Adult  Carbohydrate counting is a method of keeping track of how many carbohydrates you eat. Eating carbohydrates naturally increases the amount of sugar (glucose) in the blood. Counting how many carbohydrates you eat helps keep your blood glucose within normal limits, which helps you manage your diabetes (diabetes mellitus). It is important to know how many carbohydrates you can safely have in each meal. This is different for every person. A diet and nutrition specialist (registered dietitian) can help you make a meal plan and calculate how many carbohydrates you should have at each meal and snack. Carbohydrates are found in the following foods:  Grains, such as breads and cereals.  Dried beans and soy products.  Starchy vegetables, such as potatoes, peas, and corn.  Fruit and fruit juices.  Milk and yogurt.  Sweets and snack foods, such as cake, cookies, candy, chips, and soft drinks. How do I count carbohydrates? There are two ways to count carbohydrates in food. You can use either of the methods or a combination of both. Reading "Nutrition Facts" on packaged food The "Nutrition Facts" list is included on the labels of almost all packaged foods and beverages in the [place] It includes:  The serving size.  Information about nutrients in each serving, including the grams (g) of carbohydrate per serving. To use the "Nutrition Facts":  Decide how many servings you will have.  Multiply the number of servings by the number of carbohydrates per serving.  The resulting number is the total amount of carbohydrates that you will be having. Learning standard serving sizes of other foods When you eat carbohydrate foods that are not packaged or do not include "Nutrition Facts" on the label, you need to measure the servings in order to count the amount of carbohydrates:  Measure the foods that you will eat with a food scale or measuring  cup, if needed.  Decide how many standard-size servings you will eat.  Multiply the number of servings by 15. Most carbohydrate-rich foods have about 15 g of carbohydrates per serving. ? For example, if you eat 8 oz (170 g) of strawberries, you will have eaten 2 servings and 30 g of carbohydrates (2 servings x 15 g = 30 g).  For foods that have more than one food mixed, such as soups and casseroles, you must count the carbohydrates in each food that is included. The following list contains standard serving sizes of common carbohydrate-rich foods. Each of these servings has about 15 g of carbohydrates:   hamburger bun or  English muffin.   oz (15 mL) syrup.   oz (14 g) jelly.  1 slice of bread.  1 six-inch tortilla.  3 oz (85 g) cooked rice or pasta.  4 oz (113 g) cooked dried beans.  4 oz (113 g) starchy vegetable, such as peas, corn, or potatoes.  4 oz (113 g) hot cereal.  4 oz (113 g) mashed potatoes or  of a large baked potato.  4 oz (113 g) canned or frozen fruit.  4 oz (120 mL) fruit juice.  4-6 crackers.  6 chicken nuggets.  6 oz (170 g)  unsweetened dry cereal.  6 oz (170 g) plain fat-free yogurt or yogurt sweetened with artificial sweeteners.  8 oz (240 mL) milk.  8 oz (170 g) fresh fruit or one small piece of fruit.  24 oz (680 g) popped popcorn. Example of carbohydrate counting Sample meal  3 oz (85 g) chicken breast.  6 oz (170 g) brown rice.  4 oz (113 g) corn.  8 oz (240 mL) milk.  8 oz (170 g) strawberries with sugar-free whipped topping. Carbohydrate calculation 1. Identify the foods that contain carbohydrates: ? Rice. ? Corn. ? Milk. ? Strawberries. 2. Calculate how many servings you have of each food: ? 2 servings rice. ? 1 serving corn. ? 1 serving milk. ? 1 serving strawberries. 3. Multiply each number of servings by 15 g: ? 2 servings rice x 15 g = 30 g. ? 1 serving corn x 15 g = 15 g. ? 1 serving milk x 15 g = 15  g. ? 1 serving strawberries x 15 g = 15 g. 4. Add together all of the amounts to find the total grams of carbohydrates eaten: ? 30 g + 15 g + 15 g + 15 g = 75 g of carbohydrates total. Summary  Carbohydrate counting is a method of keeping track of how many carbohydrates you eat.  Eating carbohydrates naturally increases the amount of sugar (glucose) in the blood.  Counting how many carbohydrates you eat helps keep your blood glucose within normal limits, which helps you manage your diabetes.  A diet and nutrition specialist (registered dietitian) can help you make a meal plan and calculate how many carbohydrates you should have at each meal and snack. This information is not intended to replace advice given to you by your health care provider. Make sure you discuss any questions you have with your health care provider. Document Revised: 06/28/2017 Document Reviewed: 05/17/2016 Elsevier Patient Education  [place].

## 2020-08-02 ENCOUNTER — Ambulatory Visit: Payer: Self-pay | Admitting: Family Medicine

## 2020-08-02 DIAGNOSIS — N189 Chronic kidney disease, unspecified: Secondary | ICD-10-CM | POA: Diagnosis not present

## 2020-08-02 DIAGNOSIS — D631 Anemia in chronic kidney disease: Secondary | ICD-10-CM | POA: Diagnosis not present

## 2020-08-02 DIAGNOSIS — N184 Chronic kidney disease, stage 4 (severe): Secondary | ICD-10-CM | POA: Diagnosis not present

## 2020-08-02 DIAGNOSIS — E211 Secondary hyperparathyroidism, not elsewhere classified: Secondary | ICD-10-CM | POA: Diagnosis not present

## 2020-08-02 DIAGNOSIS — E875 Hyperkalemia: Secondary | ICD-10-CM | POA: Diagnosis not present

## 2020-08-02 DIAGNOSIS — E559 Vitamin D deficiency, unspecified: Secondary | ICD-10-CM | POA: Diagnosis not present

## 2020-08-04 ENCOUNTER — Ambulatory Visit: Payer: Self-pay | Admitting: Family Medicine

## 2020-08-05 ENCOUNTER — Encounter: Payer: Self-pay | Admitting: *Deleted

## 2020-08-05 ENCOUNTER — Other Ambulatory Visit: Payer: Self-pay | Admitting: *Deleted

## 2020-08-05 DIAGNOSIS — N185 Chronic kidney disease, stage 5: Secondary | ICD-10-CM

## 2020-08-05 DIAGNOSIS — E1322 Other specified diabetes mellitus with diabetic chronic kidney disease: Secondary | ICD-10-CM

## 2020-08-05 DIAGNOSIS — E785 Hyperlipidemia, unspecified: Secondary | ICD-10-CM

## 2020-08-05 DIAGNOSIS — IMO0002 Reserved for concepts with insufficient information to code with codable children: Secondary | ICD-10-CM

## 2020-08-05 DIAGNOSIS — I10 Essential (primary) hypertension: Secondary | ICD-10-CM

## 2020-08-05 DIAGNOSIS — M5136 Other intervertebral disc degeneration, lumbar region: Secondary | ICD-10-CM

## 2020-08-05 DIAGNOSIS — Z9114 Patient's other noncompliance with medication regimen: Secondary | ICD-10-CM

## 2020-08-13 ENCOUNTER — Other Ambulatory Visit: Payer: Self-pay | Admitting: *Deleted

## 2020-08-13 MED ORDER — AMLODIPINE BESYLATE 10 MG PO TABS: ORAL_TABLET | ORAL | 2 refills | Status: DC

## 2020-08-25 ENCOUNTER — Telehealth: Payer: Self-pay | Admitting: Pharmacist

## 2020-08-25 NOTE — Progress Notes (Unsigned)
Chronic Care Management Pharmacy Assistant   Name: Amanda Krueger  MRN: [id number] DOB: 1946-08-31  Reason for Encounter: Disease State  Patient Questions: 1. Have you seen any other providers since your last visit? Yes, 08/02/20- Manpreet Bhutani (Nephrology) 2. Any changes in your medicines or health? Yes, Patient agreed to go for access placement for kidneys.  3. Does the patient have any questions or concerns for Leata Mouse, CPP?    PCP : Alycia Rossetti, MD  Allergies:   Allergies  Allergen Reactions  . Statins Other (See Comments)    Joint pain    Medications: Outpatient Encounter Medications as of 08/25/2020  Medication Sig  . amLODipine (NORVASC) 10 MG tablet TAKE 1 TABLET EVERY DAY (NEED MD APPOINTMENT)  . aspirin 325 MG tablet Take 1 tablet (325 mg total) by mouth daily.  . Blood Glucose Monitoring Suppl (ACCU-CHEK NANO SMARTVIEW) W/DEVICE KIT   . calcitRIOL (ROCALTROL) 0.25 MCG capsule Take 1 capsule (0.25 mcg total) by mouth every other day.  . Cholecalciferol (VITAMIN D) 2000 units CAPS Take 2,000 Units by mouth daily.   . cloNIDine (CATAPRES) 0.2 MG tablet TAKE 1 TABLET TWICE DAILY  . ezetimibe (ZETIA) 10 MG tablet Take 1 tablet (10 mg total) by mouth daily.  . fish oil-omega-3 fatty acids 1000 MG capsule Take 1 g by mouth 2 (two) times a week.   . furosemide (LASIX) 40 MG tablet Take by mouth daily.   Marland Kitchen glucose blood test strip USE 1 STRIP TO CHECK GLUCOSE THREE TIMES DAILY AS  INSTRUCTED  . insulin glargine (LANTUS) 100 UNIT/ML injection Inject 0.18 mLs (18 Units total) into the skin daily.  . insulin regular (NOVOLIN R RELION) 100 units/mL injection INJECT(0.04-0.06 MLS) 4-6 UNITS SUBCUTANEOUSLY THREE TIMES DAILY BEFORE MEAL(S)  . iron polysaccharides (NIFEREX) 150 MG capsule Take 150 mg by mouth every other day.   . labetalol (NORMODYNE) 100 MG tablet TAKE 1 TABLET TWICE DAILY.  MAY TAKE ADDITIONAL 100 MG AS NEEDED  . MAGNESIUM OXIDE PO Take 250 mg by  mouth daily as needed (cramps).   Marland Kitchen OVER THE COUNTER MEDICATION Apply 1 application topically daily as needed (pain). Hempvana Pain Cream  . oxyCODONE-acetaminophen (PERCOCET/ROXICET) 5-325 MG tablet Take 1 tablet by mouth every 4 (four) hours as needed for severe pain.   No facility-administered encounter medications on file as of 08/25/2020.    Current Diagnosis: Patient Active Problem List   Diagnosis Date Noted  . Amputation of right great toe ([place]) 11/18/2019  . Special screening for malignant neoplasms, colon   . History of stroke 01/15/2017  . Uncontrolled secondary diabetes mellitus with stage 5 CKD (GFR<15) ([place]) 08/15/2016  . Non compliance w medication regimen 06/18/2016  . Infected blister of toe of left foot 11/15/2015  . S/P left knee arthroscopy 12/2013 01/19/2014  . Other specified postprocedural states 01/19/2014  . Chronic foot ulcer ([place]) 11/08/2013  . CKD (chronic kidney disease) stage 5, GFR less than 15 ml/min ([facility]) 03/06/2012  . Nail abnormalities 02/21/2012  . HTN (hypertension) 02/20/2012  . DDD (degenerative disc disease), lumbar 02/20/2012  . Hyperlipidemia 02/20/2012    Goals Addressed   None     Follow-Up:  Pharmacist Review    Recent Relevant Labs: Lab Results  Component Value Date/Time   HGBA1C 9.7 (H) 06/28/2020 11:28 AM   HGBA1C 9.0 (H) 02/25/2020 12:28 PM   HGBA1C 8.1 (H) 10/27/2019 12:16 PM   HGBA1C 7.7 (H) 10/22/2017 03:34 PM   MICROALBUR  84.1 06/16/2016 03:55 PM   MICROALBUR 106.5 (H) 07/12/2015 03:18 PM    Kidney Function Lab Results  Component Value Date/Time   CREATININE 3.73 (H) 06/28/2020 11:28 AM   CREATININE 3.61 (H) 10/27/2019 12:16 PM   GFRNONAA 29 (L) 01/18/2017 05:31 AM   GFRNONAA 29 (L) 10/20/2016 02:29 PM   GFRAA 33 (L) 01/18/2017 05:31 AM   GFRAA 33 (L) 10/20/2016 02:29 PM    . Current antihyperglycemic regimen:  Lantus 100 Unit/ML, Novolin R Relion 100 Units/mL . What recent interventions/DTPs have been made to  improve glycemic control:  o *** . Have there been any recent hospitalizations or ED visits since last visit with CPP? {yes/no:20286} . Patient {reports/denies:24182} hypoglycemic symptoms, including {Hypoglycemic Symptoms:3049003} . Patient {reports/denies:24182} hyperglycemic symptoms, including {symptoms; hyperglycemia:17903} . How often are you checking your blood sugar? {BG Testing frequency:23922} . What are your blood sugars ranging?  o Fasting: *** o Before meals: *** o After meals: *** o Bedtime: *** . During the week, how often does your blood glucose drop below 70? {LowBGfrequency:24142} . Are you checking your feet daily/regularly?   Adherence Review: Is the patient currently on a STATIN medication? {yes/no:20286} Is the patient currently on ACE/ARB medication? {yes/no:20286} Does the patient have >5 day gap between last estimated fill dates? {yes/no:20286}   Maren Reamer

## 2020-08-31 ENCOUNTER — Other Ambulatory Visit: Payer: Self-pay

## 2020-08-31 DIAGNOSIS — N185 Chronic kidney disease, stage 5: Secondary | ICD-10-CM

## 2020-09-01 ENCOUNTER — Ambulatory Visit: Payer: Self-pay | Admitting: Family Medicine

## 2020-09-14 ENCOUNTER — Ambulatory Visit: Payer: Medicare HMO | Admitting: Vascular Surgery

## 2020-09-14 ENCOUNTER — Other Ambulatory Visit: Payer: Self-pay

## 2020-09-14 ENCOUNTER — Ambulatory Visit (INDEPENDENT_AMBULATORY_CARE_PROVIDER_SITE_OTHER)
Admission: RE | Admit: 2020-09-14 | Discharge: 2020-09-14 | Disposition: A | Discharge status: Home / Self Care | Payer: Medicare HMO | Source: Ambulatory Visit | Attending: Vascular Surgery | Admitting: Vascular Surgery

## 2020-09-14 ENCOUNTER — Ambulatory Visit (HOSPITAL_COMMUNITY)
Admission: RE | Admit: 2020-09-14 | Discharge: 2020-09-14 | Disposition: A | Discharge status: Home / Self Care | Payer: Medicare HMO | Source: Ambulatory Visit | Attending: Vascular Surgery | Admitting: Vascular Surgery

## 2020-09-14 ENCOUNTER — Encounter: Payer: Self-pay | Admitting: Vascular Surgery

## 2020-09-14 DIAGNOSIS — N185 Chronic kidney disease, stage 5: Secondary | ICD-10-CM

## 2020-09-14 DIAGNOSIS — N184 Chronic kidney disease, stage 4 (severe): Secondary | ICD-10-CM

## 2020-09-14 NOTE — Progress Notes (Signed)
Patient name: Amanda Krueger MRN: [id number] DOB: 08/02/1946 Sex: female  REASON FOR CONSULT: Evaluate for permanent dialysis access  HPI: Amanda Krueger is a 74 y.o. female, with history of hypertension, diabetes, hyperlipidemia and stage IV CKD that presents for evaluation of permanent dialysis access.  Patient states she has never had dialysis access in the past.  She is right-hand dominant.  Not currently on dialysis at this time.  States she has been on disability for some time.  No chest wall implants.  No previous catheter placement.  She is under the care of Dr. Theador Hawthorne with nephrology in [place].  Past Medical History:  Diagnosis Date  . Diabetes mellitus   . Diabetes mellitus due to underlying condition with stage 3 chronic kidney disease, with long-term current use of insulin ([place]) 08/15/2016  . DJD (degenerative joint disease)   . Hyperlipidemia   . Hypertension     Past Surgical History:  Procedure Laterality Date  .  rt toe debridement    . CATARACT EXTRACTION, BILATERAL     right eye-2007, left eye-2012  . COLONOSCOPY N/A 11/01/2018   Procedure: COLONOSCOPY;  Surgeon: Danie Binder, MD;  [facility];  Service: Endoscopy;  Laterality: N/A;  1:00  . EXAM UNDER ANESTHESIA WITH MANIPULATION OF KNEE Left 12/26/2013   Procedure: EXAM UNDER ANESTHESIA WITH MANIPULATION OF KNEE;  Surgeon: Carole Civil, MD;  Location: [facility];  Service: Orthopedics;  Laterality: Left;  . KNEE ARTHROSCOPY Left 12/26/2013   Procedure: ARTHROSCOPY KNEE;  Surgeon: Carole Civil, MD;  Location: [facility];  Service: Orthopedics;  Laterality: Left;  . POLYPECTOMY  11/01/2018   Procedure: POLYPECTOMY;  Surgeon: Danie Binder, MD;  Location: [facility];  Service: Endoscopy;;  . right toe     Great toe  . SYNOVECTOMY Left 12/26/2013   Procedure: EXTENSIVE SYNOVECTOMY LEFT KNEE;  Surgeon: Carole Civil, MD;  Location: [facility];  Service: Orthopedics;  Laterality: Left;  .  WOUND DEBRIDEMENT Left 12/26/2013   Procedure: LEFT GREAT TOE CALLOUS DEBRIDEMENT;  Surgeon: Carole Civil, MD;  Location: [facility];  Service: Orthopedics;  Laterality: Left;    Family History  Problem Relation Age of Onset  . Diabetes Mother   . Stroke Father   . Heart disease Father   . Diabetes Sister   . Diabetes Brother   . Diabetes Sister   . Colon cancer Brother 93       DEATH DUE TO COLON CANCER  . Colon polyps Neg Hx     SOCIAL HISTORY: Social History   Socioeconomic History  . Marital status: Divorced    Spouse name: Not on file  . Number of children: Not on file  . Years of education: Not on file  . Highest education level: Not on file  Occupational History  . Not on file  Tobacco Use  . Smoking status: Never Smoker  . Smokeless tobacco: Never Used  Substance and Sexual Activity  . Alcohol use: No  . Drug use: No  . Sexual activity: Not on file  Other Topics Concern  . Not on file  Social History Narrative  . Not on file   Social Determinants of Health   Financial Resource Strain: Low Risk   . Difficulty of Paying Living Expenses: Not very hard  Food Insecurity:   . Worried About Charity fundraiser in the Last Year: Not on file  . Ran Out of Food in the  Last Year: Not on file  Transportation Needs:   . Lack of Transportation (Medical): Not on file  . Lack of Transportation (Non-Medical): Not on file  Physical Activity:   . Days of Exercise per Week: Not on file  . Minutes of Exercise per Session: Not on file  Stress:   . Feeling of Stress : Not on file  Social Connections:   . Frequency of Communication with Friends and Family: Not on file  . Frequency of Social Gatherings with Friends and Family: Not on file  . Attends Religious Services: Not on file  . Active Member of Clubs or Organizations: Not on file  . Attends Archivist Meetings: Not on file  . Marital Status: Not on file  Intimate Partner Violence:   . Fear of Current or  Ex-Partner: Not on file  . Emotionally Abused: Not on file  . Physically Abused: Not on file  . Sexually Abused: Not on file    Allergies  Allergen Reactions  . Statins Other (See Comments)    Joint pain    Current Outpatient Medications  Medication Sig Dispense Refill  . amLODipine (NORVASC) 10 MG tablet TAKE 1 TABLET EVERY DAY (NEED MD APPOINTMENT) 30 tablet 2  . aspirin 325 MG tablet Take 1 tablet (325 mg total) by mouth daily. 30 tablet 12  . Blood Glucose Monitoring Suppl (ACCU-CHEK NANO SMARTVIEW) W/DEVICE KIT     . calcitRIOL (ROCALTROL) 0.25 MCG capsule Take 1 capsule (0.25 mcg total) by mouth every other day. 45 capsule 0  . Cholecalciferol (VITAMIN D) 2000 units CAPS Take 2,000 Units by mouth daily.     . cloNIDine (CATAPRES) 0.2 MG tablet TAKE 1 TABLET TWICE DAILY 180 tablet 3  . fish oil-omega-3 fatty acids 1000 MG capsule Take 1 g by mouth 2 (two) times a week.     . furosemide (LASIX) 40 MG tablet Take by mouth daily.     Marland Kitchen glucose blood test strip USE 1 STRIP TO CHECK GLUCOSE THREE TIMES DAILY AS  INSTRUCTED 100 each 2  . insulin glargine (LANTUS) 100 UNIT/ML injection Inject 0.18 mLs (18 Units total) into the skin daily. 10 mL 0  . insulin regular (NOVOLIN R RELION) 100 units/mL injection INJECT(0.04-0.06 MLS) 4-6 UNITS SUBCUTANEOUSLY THREE TIMES DAILY BEFORE MEAL(S) 10 mL 3  . iron polysaccharides (NIFEREX) 150 MG capsule Take 150 mg by mouth every other day.     . labetalol (NORMODYNE) 100 MG tablet TAKE 1 TABLET TWICE DAILY.  MAY TAKE ADDITIONAL 100 MG AS NEEDED 270 tablet 1  . MAGNESIUM OXIDE PO Take 250 mg by mouth daily as needed (cramps).     Marland Kitchen OVER THE COUNTER MEDICATION Apply 1 application topically daily as needed (pain). Hempvana Pain Cream    . oxyCODONE-acetaminophen (PERCOCET/ROXICET) 5-325 MG tablet Take 1 tablet by mouth every 4 (four) hours as needed for severe pain. 30 tablet 0  . ezetimibe (ZETIA) 10 MG tablet Take 1 tablet (10 mg total) by mouth  daily. 90 tablet 3   No current facility-administered medications for this visit.    REVIEW OF SYSTEMS:  '[X]'  denotes positive finding, '[ ]'  denotes negative finding Cardiac  Comments:  Chest pain or chest pressure:    Shortness of breath upon exertion:    Short of breath when lying flat:    Irregular heart rhythm:        Vascular    Pain in calf, thigh, or hip brought on by ambulation:  Pain in feet at night that wakes you up from your sleep:     Blood clot in your veins:    Leg swelling:         Pulmonary    Oxygen at home:    Productive cough:     Wheezing:         Neurologic    Sudden weakness in arms or legs:     Sudden numbness in arms or legs:     Sudden onset of difficulty speaking or slurred speech:    Temporary loss of vision in one eye:     Problems with dizziness:         Gastrointestinal    Blood in stool:     Vomited blood:         Genitourinary    Burning when urinating:     Blood in urine:        Psychiatric    Major depression:         Hematologic    Bleeding problems:    Problems with blood clotting too easily:        Skin    Rashes or ulcers:        Constitutional    Fever or chills:      PHYSICAL EXAM: Vitals:   09/14/20 1201  BP: (!) 167/79  Pulse: 74  Resp: 14  Temp: (!) 96.7 F (35.9 C)  TempSrc: Temporal  SpO2: 97%  Weight: 180 lb (81.6 kg)  Height: '5\' 10"'  (1.778 m)    GENERAL: The patient is a well-nourished female, in no acute distress. The vital signs are documented above. CARDIAC: There is a regular rate and rhythm.  VASCULAR:  Palpable radial brachial pulses bilateral upper extremities No chest wall implants PULMONARY: There is good air exchange bilaterally without wheezing or rales. ABDOMEN: Soft and non-tender with normal pitched bowel sounds.  MUSCULOSKELETAL: There are no major deformities or cyanosis. NEUROLOGIC: No focal weakness or paresthesias are detected. SKIN: There are no ulcers or rashes  noted. PSYCHIATRIC: The patient has a normal affect.  DATA:   Upper extremity arterial duplex shows biphasic waveforms in both upper extremities.  Upper extremity vein mapping shows small surface veins except for a borderline left basilic vein that looks likely usable.  Assessment/Plan:  74 year old female with stage IV chronic kidney disease secondary to hypertension and diabetes that presents for evaluation of permanent dialysis access.  Discussed plan for placement in the nondominant arm which would be her left arm.  After review of vein mapping, discussed she has a marginal basilic vein that would hopefully be usable in the left arm and all other surface veins look small at this time.  We discussed basilic vein fistula typically being done in two stages and alternative would be AV graft.  Risk and benefits were discussed in detail including risk of bleeding, infection, failure to mature, steal syndrome.  Offered to get her scheduled later this week and she wants to wait about 3 to 4 weeks given recent death in her family.  We will go and get her scheduled today.   Marty Heck, MD [facility] Office: [phone]

## 2020-09-24 ENCOUNTER — Ambulatory Visit: Payer: Medicare HMO | Admitting: Podiatry

## 2020-09-24 ENCOUNTER — Other Ambulatory Visit: Payer: Self-pay

## 2020-09-24 ENCOUNTER — Encounter: Payer: Self-pay | Admitting: Podiatry

## 2020-09-24 DIAGNOSIS — M79674 Pain in right toe(s): Secondary | ICD-10-CM

## 2020-09-24 DIAGNOSIS — Q828 Other specified congenital malformations of skin: Secondary | ICD-10-CM | POA: Diagnosis not present

## 2020-09-24 DIAGNOSIS — I739 Peripheral vascular disease, unspecified: Secondary | ICD-10-CM | POA: Diagnosis not present

## 2020-09-24 DIAGNOSIS — B351 Tinea unguium: Secondary | ICD-10-CM | POA: Diagnosis not present

## 2020-09-24 DIAGNOSIS — M79675 Pain in left toe(s): Secondary | ICD-10-CM | POA: Diagnosis not present

## 2020-09-25 NOTE — Progress Notes (Signed)
Subjective:   Patient ID: Amanda Krueger, female   DOB: 74 y.o.   MRN: [id number]   HPI Patient states that she has had thickening of her nailbeds and that the left one is pulling away they are painful and she needs to have them taken care of.  She does have diminishment of circulatory status   ROS      Objective:  Physical Exam  Neurovascular status appears stable but it is diminished bilateral with thick yellow brittle nailbeds 1-5 both feet     Assessment:  Mycotic nail infections that do get painful bilateral 1 through 4 right 1 through 5 left     Plan:  H&P reviewed condition and at this point debrided nailbeds 1-5 both feet with no iatrogenic bleeding do not recommend permanent procedure due to circulatory status

## 2020-09-28 ENCOUNTER — Telehealth: Payer: Self-pay

## 2020-09-28 NOTE — Telephone Encounter (Signed)
Patient called requesting to cancel her left arm AVF vs AVG surgery with Dr. Carlis Abbott scheduled on 10/11/20 until after she talk with her nephrologist at end of month during visit. She will call back to reschedule.

## 2020-10-01 ENCOUNTER — Other Ambulatory Visit: Payer: Self-pay | Admitting: Family Medicine

## 2020-10-05 ENCOUNTER — Ambulatory Visit: Payer: Medicare HMO | Admitting: Podiatry

## 2020-10-06 DIAGNOSIS — E875 Hyperkalemia: Secondary | ICD-10-CM | POA: Diagnosis not present

## 2020-10-06 DIAGNOSIS — E1122 Type 2 diabetes mellitus with diabetic chronic kidney disease: Secondary | ICD-10-CM | POA: Diagnosis not present

## 2020-10-06 DIAGNOSIS — N185 Chronic kidney disease, stage 5: Secondary | ICD-10-CM | POA: Diagnosis not present

## 2020-10-06 DIAGNOSIS — N189 Chronic kidney disease, unspecified: Secondary | ICD-10-CM | POA: Diagnosis not present

## 2020-10-06 DIAGNOSIS — E559 Vitamin D deficiency, unspecified: Secondary | ICD-10-CM | POA: Diagnosis not present

## 2020-10-06 DIAGNOSIS — D631 Anemia in chronic kidney disease: Secondary | ICD-10-CM | POA: Diagnosis not present

## 2020-10-06 DIAGNOSIS — E211 Secondary hyperparathyroidism, not elsewhere classified: Secondary | ICD-10-CM | POA: Diagnosis not present

## 2020-10-08 ENCOUNTER — Inpatient Hospital Stay: Admission: RE | Admit: 2020-10-08 | Payer: Medicare HMO | Source: Ambulatory Visit

## 2020-10-08 DIAGNOSIS — I129 Hypertensive chronic kidney disease with stage 1 through stage 4 chronic kidney disease, or unspecified chronic kidney disease: Secondary | ICD-10-CM | POA: Diagnosis not present

## 2020-10-08 DIAGNOSIS — E1129 Type 2 diabetes mellitus with other diabetic kidney complication: Secondary | ICD-10-CM | POA: Diagnosis not present

## 2020-10-08 DIAGNOSIS — E211 Secondary hyperparathyroidism, not elsewhere classified: Secondary | ICD-10-CM | POA: Diagnosis not present

## 2020-10-08 DIAGNOSIS — N184 Chronic kidney disease, stage 4 (severe): Secondary | ICD-10-CM | POA: Diagnosis not present

## 2020-10-08 DIAGNOSIS — R809 Proteinuria, unspecified: Secondary | ICD-10-CM | POA: Diagnosis not present

## 2020-10-11 ENCOUNTER — Ambulatory Visit: Admit: 2020-10-11 | Payer: Medicare HMO | Admitting: Vascular Surgery

## 2020-10-11 SURGERY — ARTERIOVENOUS (AV) FISTULA CREATION
Anesthesia: Choice | Laterality: Left

## 2020-10-12 ENCOUNTER — Other Ambulatory Visit: Payer: Self-pay | Admitting: Family Medicine

## 2020-10-26 ENCOUNTER — Encounter: Payer: Self-pay | Admitting: Cardiovascular Disease

## 2020-10-26 ENCOUNTER — Ambulatory Visit: Payer: Medicare HMO | Admitting: Cardiovascular Disease

## 2020-10-26 VITALS — Ht 70.0 in | Wt 184.8 lb

## 2020-10-26 DIAGNOSIS — E785 Hyperlipidemia, unspecified: Secondary | ICD-10-CM

## 2020-10-26 DIAGNOSIS — I739 Peripheral vascular disease, unspecified: Secondary | ICD-10-CM

## 2020-10-26 DIAGNOSIS — I10 Essential (primary) hypertension: Secondary | ICD-10-CM | POA: Diagnosis not present

## 2020-10-26 NOTE — Patient Instructions (Signed)
Medication Instructions:  Dr. Fletcher Anon recommends Repatha (PCSK9). This is an injectable cholesterol medication. This medication will need prior approval with your insurance company, which we will work on. If the medication is not approved initially, we may need to do an appeal with your insurance. We will keep you updated on this process. This medication can be provided at some local pharmacies or be shipped to you from a specialty pharmacy.   *If you need a refill on your cardiac medications before your next appointment, please call your pharmacy*   Lab Work: Your provider would like for you to return in 2 months after starting Repatha to have the following labs drawn: fasting Lipid and Liver. You do not need an appointment for the lab. Once in our office lobby there is a podium where you can sign in and ring the doorbell to alert [place] that you are here. The lab is open from 8:00 am to 4:30 pm; closed for lunch from 12:45pm-1:45pm.  If you have labs (blood work) drawn today and your tests are completely normal, you will receive your results only by: Marland Kitchen MyChart Message (if you have MyChart) OR . A paper copy in the mail If you have any lab test that is abnormal or we need to change your treatment, we will call you to review the results.   Testing/Procedures: None ordered   Follow-Up: At [facility], you and your health needs are our priority.  As part of our continuing mission to provide you with exceptional heart care, we have created designated Provider Care Teams.  These Care Teams include your primary Cardiologist (physician) and Advanced Practice Providers (APPs -  Physician Assistants and Nurse Practitioners) who all work together to provide you with the care you need, when you need it.  We recommend signing up for the patient portal called "MyChart".  Sign up information is provided on this After Visit Summary.  MyChart is used to connect with patients for Virtual Visits (Telemedicine).   Patients are able to view lab/test results, encounter notes, upcoming appointments, etc.  Non-urgent messages can be sent to your provider as well.   To learn more about what you can do with MyChart, go to [email or web address].    Your next appointment:   6 month(s)  The format for your next appointment:   In Person  Provider:   Kathlyn Sacramento, MD

## 2020-10-26 NOTE — Progress Notes (Signed)
Cardiology Office Note   Date:  10/26/2020   ID:  Amanda Krueger, DOB 08-28-1946, MRN [id number]  PCP:  Alycia Rossetti, MD  Cardiologist:   Kathlyn Sacramento, MD   No chief complaint on file.     History of Present Illness: Amanda Krueger is a 74 y.o. female who is here today for follow-up visit regarding peripheral arterial disease.    She has known history of essential hypertension, hyperlipidemia, type 2 diabetes and chronic kidney disease.  She had previous CVA.  She has history of intolerance to statins.  She had diabetes for more than 25 years.  The patient had previous amputation of the right big toe. She was seen earlier this year after she developed a wound on the bottom of the left big toe at the site of a callus.   She underwent vascular studies in April which showed noncompressible vessels bilaterally with abnormal toe pressure.  Duplex showed borderline stenosis in the right external iliac artery.  There was mild common femoral and SFA disease.  On the left side, there was borderline stenosis in the external iliac artery with velocities in the 200 range.  There was also mild to moderate common femoral artery disease with significant stenosis in the anterior tibial artery followed by short segment occlusion and suspecting the occlusion of the distal posterior tibial artery. The ulceration ultimately healed without intervention no angiography was performed in the setting of advanced chronic kidney disease.  I tried her on Zetia for hyperlipidemia but she did not tolerate the medication for myalgia.  She has been doing reasonably well with no shortness of breath.  She reports rare episodes of substernal chest tightness lasting few minutes only.  She reports 2 brief episodes in the last 6 months.  Past Medical History:  Diagnosis Date  . Diabetes mellitus   . Diabetes mellitus due to underlying condition with stage 3 chronic kidney disease, with long-term current use of  insulin ([place]) 08/15/2016  . DJD (degenerative joint disease)   . Hyperlipidemia   . Hypertension     Past Surgical History:  Procedure Laterality Date  .  rt toe debridement    . CATARACT EXTRACTION, BILATERAL     right eye-2007, left eye-2012  . COLONOSCOPY N/A 11/01/2018   Procedure: COLONOSCOPY;  Surgeon: Danie Binder, MD;  [facility]: [facility];  Service: Endoscopy;  Laterality: N/A;  1:00  . EXAM UNDER ANESTHESIA WITH MANIPULATION OF KNEE Left 12/26/2013   Procedure: EXAM UNDER ANESTHESIA WITH MANIPULATION OF KNEE;  Surgeon: Carole Civil, MD;  Location: [facility];  Service: Orthopedics;  Laterality: Left;  . KNEE ARTHROSCOPY Left 12/26/2013   Procedure: ARTHROSCOPY KNEE;  Surgeon: Carole Civil, MD;  Location: [facility];  Service: Orthopedics;  Laterality: Left;  . POLYPECTOMY  11/01/2018   Procedure: POLYPECTOMY;  Surgeon: Danie Binder, MD;  Location: [facility];  Service: Endoscopy;;  . right toe     Great toe  . SYNOVECTOMY Left 12/26/2013   Procedure: EXTENSIVE SYNOVECTOMY LEFT KNEE;  Surgeon: Carole Civil, MD;  Location: [facility];  Service: Orthopedics;  Laterality: Left;  . WOUND DEBRIDEMENT Left 12/26/2013   Procedure: LEFT GREAT TOE CALLOUS DEBRIDEMENT;  Surgeon: Carole Civil, MD;  Location: [facility];  Service: Orthopedics;  Laterality: Left;     Current Outpatient Medications  Medication Sig Dispense Refill  . amLODipine (NORVASC) 10 MG tablet TAKE 1 TABLET EVERY DAY (NEED MD APPOINTMENT) 90 tablet  2  . aspirin 325 MG tablet Take 1 tablet (325 mg total) by mouth daily. 30 tablet 12  . Blood Glucose Monitoring Suppl (ACCU-CHEK NANO SMARTVIEW) W/DEVICE KIT     . calcitRIOL (ROCALTROL) 0.25 MCG capsule Take 1 capsule (0.25 mcg total) by mouth every other day. 45 capsule 0  . Cholecalciferol (VITAMIN D) 2000 units CAPS Take 2,000 Units by mouth daily.     . cloNIDine (CATAPRES) 0.2 MG tablet TAKE 1 TABLET TWICE DAILY 180 tablet 3  . fish oil-omega-3 fatty  acids 1000 MG capsule Take 1 g by mouth 2 (two) times a week.     . furosemide (LASIX) 40 MG tablet Take by mouth daily.     Marland Kitchen glucose blood test strip USE 1 STRIP TO CHECK GLUCOSE THREE TIMES DAILY AS  INSTRUCTED 100 each 2  . insulin regular (NOVOLIN R RELION) 100 units/mL injection INJECT(0.04-0.06 MLS) 4-6 UNITS SUBCUTANEOUSLY THREE TIMES DAILY BEFORE MEAL(S) 10 mL 3  . iron polysaccharides (NIFEREX) 150 MG capsule Take 150 mg by mouth every other day.     . labetalol (NORMODYNE) 100 MG tablet TAKE 1 TABLET TWICE DAILY.  MAY TAKE ADDITIONAL 100 MG AS NEEDED 270 tablet 1  . LANTUS 100 UNIT/ML injection INJECT 18 UNITS SUBCUTANEOUSLY ONCE DAILY 10 mL 2  . MAGNESIUM OXIDE PO Take 250 mg by mouth daily as needed (cramps).     Marland Kitchen oxyCODONE-acetaminophen (PERCOCET/ROXICET) 5-325 MG tablet Take 1 tablet by mouth every 4 (four) hours as needed for severe pain. 30 tablet 0  . OVER THE COUNTER MEDICATION Apply 1 application topically daily as needed (pain). Hempvana Pain Cream (Patient not taking: Reported on 10/26/2020)     No current facility-administered medications for this visit.    Allergies:   Statins    Social History:  The patient  reports that she has never smoked. She has never used smokeless tobacco. She reports that she does not drink alcohol and does not use drugs.   Family History:  The patient's family history includes Colon cancer (age of onset: 5) in her brother; Diabetes in her brother, mother, sister, and sister; Heart disease in her father; Stroke in her father.    ROS:  Please see the history of present illness.   Otherwise, review of systems are positive for none.   All other systems are reviewed and negative.    PHYSICAL EXAM: VS:  Ht _0  (1.778 m)   Wt 184 lb 12.8 oz (83.8 kg)   SpO2 98%   BMI 26.52 kg/m  , BMI Body mass index is 26.52 kg/m. GEN: Well nourished, well developed, in no acute distress  HEENT: normal  Neck: no JVD, carotid bruits, or  masses Cardiac: RRR; no  rubs, or gallops, mild edema .  1 out of 6 systolic murmur at the aortic area Respiratory:  clear to auscultation bilaterally, normal work of breathing GI: soft, nontender, nondistended, + BS MS: no deformity or atrophy  Skin: warm and dry, no rash Neuro:  Strength and sensation are intact Psych: euthymic mood, full affect Vascular: Radial pulses normal bilaterally.  Femoral pulses +2 bilaterally.  Distal pulses are not palpable    EKG:  EKG is ordered today. The ekg ordered today demonstrates normal sinus rhythm with poor R wave progression in the anterior leads and inferior T wave changes suggestive of ischemia.   Recent Labs: 06/28/2020: BUN 69; Creat 3.73; Hemoglobin 11.5; Platelets 208; Potassium 5.1; Sodium 138 07/28/2020: ALT 14  Lipid Panel    Component Value Date/Time   CHOL 189 07/28/2020 1104   TRIG 84 07/28/2020 1104   HDL 60 07/28/2020 1104   CHOLHDL 3.2 07/28/2020 1104   CHOLHDL 3.0 10/27/2019 1216   VLDL 19 01/16/2017 0605   LDLCALC 114 (H) 07/28/2020 1104   LDLCALC 127 (H) 10/27/2019 1216      Wt Readings from Last 3 Encounters:  10/26/20 184 lb 12.8 oz (83.8 kg)  09/14/20 180 lb (81.6 kg)  06/28/20 176 lb (79.8 kg)       PAD Screen 03/19/2017  Previous PAD dx? Yes  Previous surgical procedure? No  Pain with walking? No  Feet/toe relief with dangling? No  Painful, non-healing ulcers? No  Extremities discolored? No      ASSESSMENT AND PLAN:  1.  Peripheral arterial disease: She has evidence of small vessel disease.  Currently with no significant claudication and no ulceration.  Continue medical therapy.  Angiography would be reserved for critical limb ischemia given advanced chronic kidney disease.  2.  Hyperlipidemia: Most recent LDL was 127.  She did not tolerate statins and most recently she did not tolerate Zetia.  Given known peripheral arterial disease and prolonged history of diabetes, we have to get her LDL below  70.  I recommend treatment with Repatha 140 mg every 2 weeks.  3.  Essential hypertension: Blood pressure is elevated but she reports whitecoat syndrome .  4.  Abnormal EKG: The patient is at high risk for ischemic heart disease.  At the present time, she is not having convincing anginal symptoms.  Recommend aggressive treatment of risk factors for now and consider stress testing in the future.   Disposition:   FU with me in 6 months  Signed,  Kathlyn Sacramento, MD  10/26/2020 1:20 PM     [facility]

## 2020-11-01 ENCOUNTER — Telehealth: Payer: Self-pay

## 2020-11-01 NOTE — Telephone Encounter (Signed)
Called and lmomed the pt that we would love to schedule a lipid clinic visit w/pharmd

## 2020-11-01 NOTE — Telephone Encounter (Signed)
-----   Message from Ricci Barker, RN sent at 11/01/2020  8:03 AM EST ----- Regarding: Repatha Morning!  Dr. Fletcher Anon would like for the patient to be started on Repatha.  Thanks so much, [employer]

## 2020-11-03 ENCOUNTER — Ambulatory Visit: Payer: Medicare HMO

## 2020-11-03 ENCOUNTER — Other Ambulatory Visit: Payer: Self-pay

## 2020-11-03 DIAGNOSIS — E785 Hyperlipidemia, unspecified: Secondary | ICD-10-CM

## 2020-11-03 DIAGNOSIS — I10 Essential (primary) hypertension: Secondary | ICD-10-CM

## 2020-11-03 DIAGNOSIS — E1365 Other specified diabetes mellitus with hyperglycemia: Secondary | ICD-10-CM

## 2020-11-03 DIAGNOSIS — IMO0002 Reserved for concepts with insufficient information to code with codable children: Secondary | ICD-10-CM

## 2020-11-03 MED ORDER — NOVOLOG FLEXPEN 100 UNIT/ML ~~LOC~~ SOPN: PEN_INJECTOR | SUBCUTANEOUS | 3 refills | Status: AC

## 2020-11-03 NOTE — Addendum Note (Signed)
Addended by: Vic Blackbird F on: 11/03/2020 03:56 PM   Modules accepted: Orders

## 2020-11-03 NOTE — Chronic Care Management (AMB) (Signed)
Chronic Care Management Pharmacy  Name: Amanda Krueger  MRN: [id number] DOB: Dec 12, 1946  Chief Complaint/ HPI  Amanda Krueger,  74 y.o. , female presents for their Follow-Up CCM visit with the clinical pharmacist In office.  PCP : Alycia Rossetti, MD  Their chronic conditions include: HTN, diabetes, stage 5 CKD, hyperlipidemia, history of stroke.   Financial Resource Strain: Low Risk   . Difficulty of Paying Living Expenses: Not very hard    Medications: Outpatient Encounter Medications as of 11/03/2020  Medication Sig  . amLODipine (NORVASC) 10 MG tablet TAKE 1 TABLET EVERY DAY (NEED MD APPOINTMENT)  . aspirin 325 MG tablet Take 1 tablet (325 mg total) by mouth daily.  . Blood Glucose Monitoring Suppl (ACCU-CHEK NANO SMARTVIEW) W/DEVICE KIT   . calcitRIOL (ROCALTROL) 0.25 MCG capsule Take 1 capsule (0.25 mcg total) by mouth every other day.  . Cholecalciferol (VITAMIN D) 2000 units CAPS Take 2,000 Units by mouth daily.   . cloNIDine (CATAPRES) 0.2 MG tablet TAKE 1 TABLET TWICE DAILY  . fish oil-omega-3 fatty acids 1000 MG capsule Take 1 g by mouth 2 (two) times a week.   . furosemide (LASIX) 40 MG tablet Take by mouth daily.   Marland Kitchen glucose blood test strip USE 1 STRIP TO CHECK GLUCOSE THREE TIMES DAILY AS  INSTRUCTED  . insulin regular (NOVOLIN R RELION) 100 units/mL injection INJECT(0.04-0.06 MLS) 4-6 UNITS SUBCUTANEOUSLY THREE TIMES DAILY BEFORE MEAL(S)  . iron polysaccharides (NIFEREX) 150 MG capsule Take 150 mg by mouth every other day.   . labetalol (NORMODYNE) 100 MG tablet TAKE 1 TABLET TWICE DAILY.  MAY TAKE ADDITIONAL 100 MG AS NEEDED  . LANTUS 100 UNIT/ML injection INJECT 18 UNITS SUBCUTANEOUSLY ONCE DAILY  . MAGNESIUM OXIDE PO Take 250 mg by mouth daily as needed (cramps).   Marland Kitchen OVER THE COUNTER MEDICATION Apply 1 application topically daily as needed (pain). Hempvana Pain Cream (Patient not taking: Reported on 10/26/2020)  . oxyCODONE-acetaminophen (PERCOCET/ROXICET)  5-325 MG tablet Take 1 tablet by mouth every 4 (four) hours as needed for severe pain.   No facility-administered encounter medications on file as of 11/03/2020.     Goals Addressed   None    Hypertension   BP goal is:  < 130/80  Office blood pressures are  BP Readings from Last 3 Encounters:  09/14/20 (!) 167/79  06/28/20 132/68  04/27/20 (!) 180/78   Patient checks BP at home daily Patient home BP readings are ranging: 130/60-70  Patient has failed these meds in the past: none noted Patient is currently controlled on the following medications:  . Amlodipine 38m . Clonidine 0.246mtwice daily . Labetolol 10059mid  We discussed:  She denies swelling  BP was elevated last reading in office, she reports is is always normal at the kidney center when it is checked  Denies headaches or dizziness  Counseled on medication adherence  BP goal < 130/80 Plan  Continue current medications      Diabetes   Recent Relevant Labs: Lab Results  Component Value Date/Time   HGBA1C 9.7 (H) 06/28/2020 11:28 AM   HGBA1C 9.0 (H) 02/25/2020 12:28 PM   HGBA1C 8.1 (H) 10/27/2019 12:16 PM   HGBA1C 7.7 (H) 10/22/2017 03:34 PM   MICROALBUR 84.1 06/16/2016 03:55 PM   MICROALBUR 106.5 (H) 07/12/2015 03:18 PM     Checking BG: Daily  Recent FBG Readings: 101, 84 as high as in the 150s  Patient has failed these meds in past:  none noted Patient is currently uncontrolled on the following medications:   Lantus 18 units daily  Last diabetic Foot exam:  Lab Results  Component Value Date/Time   HMDIABEYEEXA Retinopathy (A) 09/11/2018 12:00 AM   HMDIABEYEEXA Retinopathy (A) 09/11/2018 12:00 AM    Last diabetic Eye exam: No results found for: HMDIABFOOTEX   We discussed:  She admits she has not been taking her meal time insulin due to inconvenience  She does not always feel like drawing up her dose of insulin to take on the go.  She also will not take her insulin if her sugar is  around 100 and then will eat a high carb meal causing her sugar to spike.  Discussed possibility of getting her a rapid acting pen similar to the one like her Lantus so that she can take this on the go more.  She reports she never skips a dose of Lantus.  Would hesitate to increase Lantus dose due to borderline hypoglycemia she is having occasionally in the morning.  Diet:  She mainly drinks water  Does reports large servings of oatmeal in the morning  We discussed carbohydrate servings and other dietary mods she can make    She reported a point of care A1c test that was 8.1, unknown where this is from as there is no record  Recommended she get in the office to do follow up since she cancelled her last one with Dr. Buelah Manis Plan  Continue current medications  Recommend PCP visit for follow up ASAP Monitoring plan initiated Will consult with Dr. Buelah Manis on switch to a kwikpen for rapid acting insulin  Hyperlipidemia   LDL goal < 70  Lipid Panel     Component Value Date/Time   CHOL 189 07/28/2020 1104   TRIG 84 07/28/2020 1104   HDL 60 07/28/2020 1104   LDLCALC 114 (H) 07/28/2020 1104   LDLCALC 127 (H) 10/27/2019 1216    Hepatic Function Latest Ref Rng & Units 07/28/2020 06/28/2020 10/27/2019  Total Protein 6.0 - 8.5 g/dL 7.0 7.4 7.9  Albumin 3.7 - 4.7 g/dL 4.0 - -  AST 0 - 40 IU/L _0 ALT 0 - 32 IU/L _1 Alk Phosphatase 48 - 121 IU/L 93 - -  Total Bilirubin 0.0 - 1.2 mg/dL 0.2 0.4 0.5  Bilirubin, Direct 0.00 - 0.40 mg/dL 0.08 - -     The ASCVD Risk score ([place] et al., 2013) failed to calculate for the following reasons:   The patient has a prior MI or stroke diagnosis   Patient has failed these meds in past: statins (muscle pain) Patient is currently uncontrolled on the following medications:  . No medications at this time  We discussed:  The risks of elevated cholesterol with her history  She has not tolerated statins or Zetia.  LDL remains  elevated, cardiologist mentions Repatha which I agree would be very beneficial in this patient.  Will consult with cardiology on initiation of Repatha in this patient. Plan  Continue current medications. Follow up on Repatha initiation  Vaccines   Reviewed and discussed patient's vaccination history.    Immunization History  Administered Date(s) Administered  . Pneumococcal Conjugate-13 11/07/2013  . Pneumococcal Polysaccharide-23 02/19/2015    Plan  Recommended patient receive Shingles vaccine in office.  Medication Management   . Miscellaneous medications: Furosemide 43m, Oxycodone/APAP 5-3260m. OTC's: ASA 32583mVitamin D 2000u . [facility]  [phone]  Patient reports using vials method to organize medications and promote adherence. . Patient denies missed doses of medication.   Beverly Milch, PharmD Clinical Pharmacist [place] [phone]

## 2020-11-03 NOTE — Progress Notes (Signed)
Change to Novolog pen sent to pharmacy

## 2020-11-03 NOTE — Patient Instructions (Addendum)
Visit Information  Goals Addressed            This Visit's Progress   . Pharmacy Care Plan:       CARE PLAN ENTRY (see longitudinal plan of care for additional care plan information)  Current Barriers:  . Chronic Disease Management support, education, and care coordination needs related to Hypertension, Hyperlipidemia, and Diabetes   Hypertension BP Readings from Last 3 Encounters:  09/14/20 (!) 167/79  06/28/20 132/68  04/27/20 (!) 180/78   . Pharmacist Clinical Goal(s): o Over the next 90 days, patient will work with PharmD and providers to maintain BP goal <130/80 . Current regimen:  o Amlodipine 10mg  o Clonidine 0.2mg  o Labetolol 100mg  bid . Interventions: o Reviewed home blood pressure monitoring o Discussed importance of medication adherence . Patient self care activities - Over the next 90 days, patient will: o Check BP daily, document, and provide at future appointments o Ensure daily salt intake < 2300 mg/day  Hyperlipidemia Lab Results  Component Value Date/Time   LDLCALC 114 (H) 07/28/2020 11:04 AM   LDLCALC 127 (H) 10/27/2019 12:16 PM   . Pharmacist Clinical Goal(s): o Over the next 30 days, patient will work with PharmD and providers to achieve LDL goal < 70 . Current regimen:  o No medications . Interventions: o Recommended Repatha as proposed by cardiologist in recent documentation o Counseled on diet to bring LDL towards goal . Patient self care activities - Over the next 90 days, patient will: o Follow up with cardiologist on [place]  o Work on diet low in saturated fats to bring LDL towards goal.  Diabetes Lab Results  Component Value Date/Time   HGBA1C 9.7 (H) 06/28/2020 11:28 AM   HGBA1C 9.0 (H) 02/25/2020 12:28 PM   HGBA1C 8.1 (H) 10/27/2019 12:16 PM   HGBA1C 7.7 (H) 10/22/2017 03:34 PM   . Pharmacist Clinical Goal(s): o Over the next 30 days, patient will work with PharmD and providers to achieve A1c goal <7% . Current regimen:   o Lantus 18units daily  . Interventions: o Reviewed home blood sugar monitoring o Counseled on medication adherence o Recommended kwikpen to use for meal time insulin . Patient self care activities - Over the next 30 days, patient will: o Check blood sugar once daily, document, and provide at future appointments o Contact provider with any episodes of hypoglycemia o Work on taking doses of meal time insulin whether at home or on the go.   Please see past updates related to this goal by clicking on the "Past Updates" button in the selected goal         The patient verbalized understanding of instructions, educational materials, and care plan provided today and agreed to receive a mailed copy of patient instructions, educational materials, and care plan.   Telephone follow up appointment with pharmacy team member scheduled for: 88 days Beverly Milch, PharmD Clinical Pharmacist [facility] [phone]     High Cholesterol  High cholesterol is a condition in which the blood has high levels of a white, waxy, fat-like substance (cholesterol). The human body needs small amounts of cholesterol. The liver makes all the cholesterol that the body needs. Extra (excess) cholesterol comes from the food that we eat. Cholesterol is carried from the liver by the blood through the blood vessels. If you have high cholesterol, deposits (plaques) may build up on the walls of your blood vessels (arteries). Plaques make the arteries narrower and stiffer. Cholesterol plaques increase  your risk for heart attack and stroke. Work with your health care provider to keep your cholesterol levels in a healthy range. What increases the risk? This condition is more likely to develop in people who:  Eat foods that are high in animal fat (saturated fat) or cholesterol.  Are overweight.  Are not getting enough exercise.  Have a family history of high cholesterol. What are the signs or  symptoms? There are no symptoms of this condition. How is this diagnosed? This condition may be diagnosed from the results of a blood test.  If you are older than age 68, your health care provider may check your cholesterol every 4-6 years.  You may be checked more often if you already have high cholesterol or other risk factors for heart disease. The blood test for cholesterol measures:  "Bad" cholesterol (LDL cholesterol). This is the main type of cholesterol that causes heart disease. The desired level for LDL is less than 100.  "Good" cholesterol (HDL cholesterol). This type helps to protect against heart disease by cleaning the arteries and carrying the LDL away. The desired level for HDL is 60 or higher.  Triglycerides. These are fats that the body can store or burn for energy. The desired number for triglycerides is lower than 150.  Total cholesterol. This is a measure of the total amount of cholesterol in your blood, including LDL cholesterol, HDL cholesterol, and triglycerides. A healthy number is less than 200. How is this treated? This condition is treated with diet changes, lifestyle changes, and medicines. Diet changes  This may include eating more whole grains, fruits, vegetables, nuts, and fish.  This may also include cutting back on red meat and foods that have a lot of added sugar. Lifestyle changes  Changes may include getting at least 40 minutes of aerobic exercise 3 times a week. Aerobic exercises include walking, biking, and swimming. Aerobic exercise along with a healthy diet can help you maintain a healthy weight.  Changes may also include quitting smoking. Medicines  Medicines are usually given if diet and lifestyle changes have failed to reduce your cholesterol to healthy levels.  Your health care provider may prescribe a statin medicine. Statin medicines have been shown to reduce cholesterol, which can reduce the risk of heart disease. Follow these  instructions at home: Eating and drinking If told by your health care provider:  Eat chicken (without skin), fish, veal, shellfish, ground [place] breast, and round or loin cuts of red meat.  Do not eat fried foods or fatty meats, such as hot dogs and salami.  Eat plenty of fruits, such as apples.  Eat plenty of vegetables, such as broccoli, potatoes, and carrots.  Eat beans, peas, and lentils.  Eat grains such as barley, rice, couscous, and bulgur wheat.  Eat pasta without cream sauces.  Use skim or nonfat milk, and eat low-fat or nonfat yogurt and cheeses.  Do not eat or drink whole milk, cream, ice cream, egg yolks, or hard cheeses.  Do not eat stick margarine or tub margarines that contain trans fats (also called partially hydrogenated oils).  Do not eat saturated tropical oils, such as coconut oil and palm oil.  Do not eat cakes, cookies, crackers, or other baked goods that contain trans fats.  General instructions  Exercise as directed by your health care provider. Increase your activity level with activities such as gardening, walking, and taking the stairs.  Take over-the-counter and prescription medicines only as told by your health care provider.  Do not use any products that contain nicotine or tobacco, such as cigarettes and e-cigarettes. If you need help quitting, ask your health care provider.  Keep all follow-up visits as told by your health care provider. This is important. Contact a health care provider if:  You are struggling to maintain a healthy diet or weight.  You need help to start on an exercise program.  You need help to stop smoking. Get help right away if:  You have chest pain.  You have trouble breathing. This information is not intended to replace advice given to you by your health care provider. Make sure you discuss any questions you have with your health care provider. Document Revised: 12/07/2017 Document Reviewed: 06/03/2016 Elsevier  Patient Education  [place].

## 2020-11-10 ENCOUNTER — Other Ambulatory Visit: Payer: Self-pay

## 2020-11-15 DIAGNOSIS — E785 Hyperlipidemia, unspecified: Secondary | ICD-10-CM | POA: Diagnosis not present

## 2020-11-15 DIAGNOSIS — Z89421 Acquired absence of other right toe(s): Secondary | ICD-10-CM | POA: Diagnosis not present

## 2020-11-15 DIAGNOSIS — L97529 Non-pressure chronic ulcer of other part of left foot with unspecified severity: Secondary | ICD-10-CM | POA: Diagnosis not present

## 2020-11-15 DIAGNOSIS — I10 Essential (primary) hypertension: Secondary | ICD-10-CM | POA: Diagnosis not present

## 2020-11-15 DIAGNOSIS — Z794 Long term (current) use of insulin: Secondary | ICD-10-CM | POA: Diagnosis not present

## 2020-11-15 DIAGNOSIS — E11621 Type 2 diabetes mellitus with foot ulcer: Secondary | ICD-10-CM | POA: Diagnosis not present

## 2020-11-15 DIAGNOSIS — Z6825 Body mass index (BMI) 25.0-25.9, adult: Secondary | ICD-10-CM | POA: Diagnosis not present

## 2020-11-15 DIAGNOSIS — E1151 Type 2 diabetes mellitus with diabetic peripheral angiopathy without gangrene: Secondary | ICD-10-CM | POA: Diagnosis not present

## 2020-11-17 ENCOUNTER — Ambulatory Visit: Payer: Medicare HMO | Admitting: Podiatry

## 2020-11-17 ENCOUNTER — Encounter: Payer: Self-pay | Admitting: Podiatry

## 2020-11-17 ENCOUNTER — Other Ambulatory Visit: Payer: Self-pay

## 2020-11-17 DIAGNOSIS — L84 Corns and callosities: Secondary | ICD-10-CM | POA: Diagnosis not present

## 2020-11-17 DIAGNOSIS — L97521 Non-pressure chronic ulcer of other part of left foot limited to breakdown of skin: Secondary | ICD-10-CM | POA: Diagnosis not present

## 2020-11-17 MED ORDER — DOXYCYCLINE HYCLATE 100 MG PO TABS: 100.0000 mg | ORAL_TABLET | Freq: Two times a day (BID) | ORAL | 0 refills | Status: DC

## 2020-11-17 NOTE — Progress Notes (Signed)
Subjective:   Patient ID: Amanda Krueger, female   DOB: 74 y.o.   MRN: [id number]   HPI Patient presents stating she has developed a fissure in her left big toe is not sure how it happened but it is bleeding the last couple days   ROS      Objective:  Physical Exam  Neurovascular status unchanged with a small break in tissue in the proximal portion of the left first interspace with probable trauma to the area localized with no active bleeding noted with also some keratotic lesion plantar aspect left hallux      Assessment:  Might be a small ulceration of this area or a acute injury to the interspace left     Plan:  I flushed the area trim tissue and then applied Iodosorb with sterile dressing to try to dry it out.  I gave strict instructions of any changes were to occur redness to let us know and precautionary [place] put on doxycycline and I explained if this does become infection ultimately amputation may be necessary due to her history.  Will be seen back to recheck

## 2020-12-09 ENCOUNTER — Other Ambulatory Visit: Payer: Self-pay

## 2020-12-09 ENCOUNTER — Encounter: Payer: Self-pay | Admitting: Podiatry

## 2020-12-09 ENCOUNTER — Ambulatory Visit: Payer: Medicare HMO | Admitting: Podiatry

## 2020-12-09 DIAGNOSIS — E1149 Type 2 diabetes mellitus with other diabetic neurological complication: Secondary | ICD-10-CM | POA: Diagnosis not present

## 2020-12-09 DIAGNOSIS — L97522 Non-pressure chronic ulcer of other part of left foot with fat layer exposed: Secondary | ICD-10-CM | POA: Diagnosis not present

## 2020-12-09 DIAGNOSIS — M2032 Hallux varus (acquired), left foot: Secondary | ICD-10-CM | POA: Diagnosis not present

## 2020-12-09 DIAGNOSIS — M7989 Other specified soft tissue disorders: Secondary | ICD-10-CM

## 2020-12-09 MED ORDER — MUPIROCIN 2 % EX OINT: 1.0000 "application " | TOPICAL_OINTMENT | Freq: Two times a day (BID) | CUTANEOUS | 2 refills | Status: AC

## 2020-12-09 MED ORDER — DOXYCYCLINE HYCLATE 100 MG PO TABS: 100.0000 mg | ORAL_TABLET | Freq: Two times a day (BID) | ORAL | 0 refills | Status: DC

## 2020-12-14 NOTE — Progress Notes (Signed)
Subjective: 74 year old female presents the office today for concerns of a wound to her left big toe.  She states that she has had a callus on the area and she has developed an ulcer.  She is not sure what is going on.  She denies any drainage or pus any swelling or redness.  She was on antibiotics. Denies any systemic complaints such as fevers, chills, nausea, vomiting. No acute changes since last appointment, and no other complaints at this time.   Objective: AAO x3, NAD DP/PT pulses palpable bilaterally, CRT less than 3 seconds Sensation decreased with Semmes Weinstein monofilament.  Ulceration present the distal aspect left hallux with a granular wound base.  The wound measures 0.5 x 0.5 x 0.4 cm.  There is no probing to bone undermining or tunneling today.  Mild edema but there is no erythema, drainage or pus or ascending cellulitis. Hallux malleus is present No pain with calf compression, swelling, warmth, erythema  Assessment: Left hallux ulceration  Plan: -All treatment options discussed with the patient including all alternatives, risks, complications.  -Debrided the wound today lasting for 312 with scalpel to debride the wound to healthy, bleeding, granular tissue to remove nonviable tissue to promote wound healing. -Continue daily dressing changes. -Offloading shoe recommended. -Doxycycline -Patient encouraged to call the office with any questions, concerns, change in symptoms.   *x-ray next appointment  Amanda Krueger DPM

## 2020-12-16 ENCOUNTER — Ambulatory Visit (INDEPENDENT_AMBULATORY_CARE_PROVIDER_SITE_OTHER): Payer: Medicare HMO

## 2020-12-16 ENCOUNTER — Ambulatory Visit: Payer: Medicare HMO | Admitting: Podiatry

## 2020-12-16 ENCOUNTER — Other Ambulatory Visit: Payer: Self-pay

## 2020-12-16 DIAGNOSIS — Z0289 Encounter for other administrative examinations: Secondary | ICD-10-CM | POA: Diagnosis not present

## 2020-12-16 DIAGNOSIS — L97522 Non-pressure chronic ulcer of other part of left foot with fat layer exposed: Secondary | ICD-10-CM

## 2020-12-16 DIAGNOSIS — E1149 Type 2 diabetes mellitus with other diabetic neurological complication: Secondary | ICD-10-CM

## 2020-12-20 NOTE — Progress Notes (Signed)
Subjective: 75 year old female presents the office today for follow-up evaluation of a wound to her left big toe.  She thinks the area is doing better.  She has not seen any pus coming from the area.  Some occasional bleeding.  Denies any increase in swelling or redness or any red streaks. Denies any systemic complaints such as fevers, chills, nausea, vomiting. No acute changes since last appointment, and no other complaints at this time.   Objective: AAO x3, NAD DP/PT pulses palpable bilaterally, CRT less than 3 seconds Sensation decreased with Semmes Weinstein monofilament.  Ulceration present the distal aspect left hallux with a granular wound base.  The wound measures 0.4 x 0.4 x 0.3 cm.  There is no probing to bone undermining or tunneling today.  Mild edema but there is no erythema, drainage or pus or ascending cellulitis. Hallux malleus is present No pain with calf compression, swelling, warmth, erythema  Assessment: Left hallux ulceration  Plan: -All treatment options discussed with the patient including all alternatives, risks, complications.  -X-ray taken reviewed.  There is chronic changes present distal aspect of phalanx however he is somewhat chronic.  No evidence of acute osteomyelitis or soft tissue emphysema. -Sharply debrided the wound today lasting for 312 with scalpel to debride the wound to healthy, bleeding, granular tissue to remove nonviable tissue to promote wound healing. -Continue daily dressing changes. -Offloading shoe recommended. -Finish doxycycline -Patient encouraged to call the office with any questions, concerns, change in symptoms.   *x-ray next appointment  Trula Slade DPM

## 2020-12-23 ENCOUNTER — Telehealth: Payer: Self-pay | Admitting: Pharmacist

## 2020-12-23 NOTE — Progress Notes (Addendum)
Chronic Care Management Pharmacy Assistant   Name: Amanda Krueger  MRN: [id number] DOB: 1946/03/03  Reason for Encounter: Disease State for DM.  Patient Questions:  1.  Have you seen any other providers since your last visit? Yes.   2.  Any changes in your medicines or health? Yes.    PCP : Alycia Rossetti, MD    Their chronic conditions include: HTN, diabetes, stage 5 CKD, hyperlipidemia, history of stroke.  Office Visits: None since 11/03/20  Consults: 12/16/20 Podiatry Trula Slade, DPM for left big toe fissure. Xray taken. FINISHED Doxycycline 100 mg.   12/09/20 Podiatry Trula Slade, DPM for left big to fissure. STARTED another round of Doxycycline 100 mg.STARTED Mupirocin 2%.  11/17/20 Podiatry [place], Tamala Fothergill, DPM for Left Big toe fissure. STARTED Doxycycline 100 mg.  Allergies:   Allergies  Allergen Reactions   Statins Other (See Comments)    Joint pain    Medications: Outpatient Encounter Medications as of 12/23/2020  Medication Sig   amLODipine (NORVASC) 10 MG tablet TAKE 1 TABLET EVERY DAY (NEED MD APPOINTMENT)   aspirin 325 MG tablet Take 1 tablet (325 mg total) by mouth daily.   Blood Glucose Monitoring Suppl (ACCU-CHEK NANO SMARTVIEW) W/DEVICE KIT    calcitRIOL (ROCALTROL) 0.25 MCG capsule Take 1 capsule (0.25 mcg total) by mouth every other day.   Cholecalciferol (VITAMIN D) 2000 units CAPS Take 2,000 Units by mouth daily.    cloNIDine (CATAPRES) 0.2 MG tablet TAKE 1 TABLET TWICE DAILY   doxycycline (VIBRA-TABS) 100 MG tablet Take 1 tablet (100 mg total) by mouth 2 (two) times daily.   doxycycline (VIBRA-TABS) 100 MG tablet Take 1 tablet (100 mg total) by mouth 2 (two) times daily.   fish oil-omega-3 fatty acids 1000 MG capsule Take 1 g by mouth 2 (two) times a week.    furosemide (LASIX) 40 MG tablet Take by mouth daily.    glucose blood test strip USE 1 STRIP TO CHECK GLUCOSE THREE TIMES DAILY AS  INSTRUCTED   insulin aspart (NOVOLOG  FLEXPEN) 100 UNIT/ML FlexPen Inject 4-6 units with meals TID   insulin regular (NOVOLIN R RELION) 100 units/mL injection INJECT(0.04-0.06 MLS) 4-6 UNITS SUBCUTANEOUSLY THREE TIMES DAILY BEFORE MEAL(S)   iron polysaccharides (NIFEREX) 150 MG capsule Take 150 mg by mouth every other day.    labetalol (NORMODYNE) 100 MG tablet TAKE 1 TABLET TWICE DAILY.  MAY TAKE ADDITIONAL 100 MG AS NEEDED   LANTUS 100 UNIT/ML injection INJECT 18 UNITS SUBCUTANEOUSLY ONCE DAILY   MAGNESIUM OXIDE PO Take 250 mg by mouth daily as needed (cramps).    mupirocin ointment (BACTROBAN) 2 % Apply 1 application topically 2 (two) times daily.   OVER THE COUNTER MEDICATION Apply 1 application topically daily as needed (pain). Hempvana Pain Cream (Patient not taking: Reported on 10/26/2020)   oxyCODONE-acetaminophen (PERCOCET/ROXICET) 5-325 MG tablet Take 1 tablet by mouth every 4 (four) hours as needed for severe pain.   No facility-administered encounter medications on file as of 12/23/2020.    Current Diagnosis: Patient Active Problem List   Diagnosis Date Noted   Chronic kidney disease (CKD), stage IV (severe) ([place]) 09/14/2020   Amputation of right great toe ([place]) 11/18/2019   Special screening for malignant neoplasms, colon    History of stroke 01/15/2017   Uncontrolled secondary diabetes mellitus with stage 5 CKD (GFR<15) ([place]) 08/15/2016   Non compliance w medication regimen 06/18/2016   Infected blister of toe of left foot 11/15/2015  S/P left knee arthroscopy 12/2013 01/19/2014   Other specified postprocedural states 01/19/2014   Chronic foot ulcer ([place]) 11/08/2013   CKD (chronic kidney disease) stage 5, GFR less than 15 ml/min ([facility]) 03/06/2012   Nail abnormalities 02/21/2012   HTN (hypertension) 02/20/2012   DDD (degenerative disc disease), lumbar 02/20/2012   Hyperlipidemia 02/20/2012    Goals Addressed   None    Recent Relevant Labs: Lab Results  Component Value Date/Time   HGBA1C 9.7 (H) 06/28/2020  11:28 AM   HGBA1C 9.0 (H) 02/25/2020 12:28 PM   HGBA1C 8.1 (H) 10/27/2019 12:16 PM   HGBA1C 7.7 (H) 10/22/2017 03:34 PM   MICROALBUR 84.1 06/16/2016 03:55 PM   MICROALBUR 106.5 (H) 07/12/2015 03:18 PM    Kidney Function Lab Results  Component Value Date/Time   CREATININE 3.73 (H) 06/28/2020 11:28 AM   CREATININE 3.61 (H) 10/27/2019 12:16 PM   GFRNONAA 29 (L) 01/18/2017 05:31 AM   GFRNONAA 29 (L) 10/20/2016 02:29 PM   GFRAA 33 (L) 01/18/2017 05:31 AM   GFRAA 33 (L) 10/20/2016 02:29 PM    Current antihyperglycemic regimen:  Lantus 18 units daily Novolin R 5 units bid  How often are you checking your blood sugar? Patient stated she checks her blood sugar twice daily.  What are your blood sugars ranging? Patient stated she has not been writing them down. Fasting: N/A Before meals: N/A After meals: N/A Bedtime: N/A  Adherence Review: Is the patient currently on a STATIN medication? No.  Is the patient currently on ACE/ARB medication? No.   Does the patient have >5 day gap between last estimated fill dates? No, CPP Please Check.  I was unable to complete the assessment the patient needed to end the call because of personal reasons. Informed the patient that I would call her at another time that was better for her.Changed call to February.   Follow-Up:  Pharmacist Review   Charlann Lange, RMA Clinical Pharmacist Assistant [phone]  3 minutes spent in review, coordination, and documentation.  Reviewed by: Beverly Milch, PharmD Clinical Pharmacist [facility] [phone]

## 2020-12-27 ENCOUNTER — Other Ambulatory Visit: Payer: Self-pay

## 2020-12-27 ENCOUNTER — Other Ambulatory Visit
Admission: RE | Admit: 2020-12-27 | Discharge: 2020-12-27 | Disposition: A | Discharge status: Home / Self Care | Payer: Medicare HMO | Source: Ambulatory Visit | Attending: Vascular Surgery | Admitting: Vascular Surgery

## 2020-12-27 DIAGNOSIS — Z20822 Contact with and (suspected) exposure to covid-19: Secondary | ICD-10-CM | POA: Diagnosis not present

## 2020-12-27 DIAGNOSIS — Z01812 Encounter for preprocedural laboratory examination: Secondary | ICD-10-CM | POA: Diagnosis not present

## 2020-12-27 LAB — SARS CORONAVIRUS 2 (TAT 6-24 HRS): SARS Coronavirus 2: NEGATIVE

## 2020-12-29 ENCOUNTER — Encounter (HOSPITAL_COMMUNITY): Payer: Self-pay | Admitting: Vascular Surgery

## 2020-12-29 NOTE — Progress Notes (Signed)
PCP:  Dr. Vic Blackbird Cardiologist:  Denies  EKG:  10/26/20 CXR:  N/A ECHO:  Denies Stress Test: Denies Cardiac Cath: Denies  Fasting Blood Sugar-  101-300 Checks Blood Sugar_2__ times a day  OSA/CPAP:  No  ASA:  Continue Blood Thinners: NO  Covid test 1/10 negative  Anesthesia Review:  No  Patient denies shortness of breath, fever, cough, and chest pain at PAT appointment.  Patient verbalized understanding of instructions provided today at the PAT appointment.  Patient asked to review instructions at home and day of surgery.

## 2020-12-30 ENCOUNTER — Ambulatory Visit (INDEPENDENT_AMBULATORY_CARE_PROVIDER_SITE_OTHER): Payer: Medicare HMO

## 2020-12-30 ENCOUNTER — Other Ambulatory Visit: Payer: Self-pay

## 2020-12-30 ENCOUNTER — Ambulatory Visit: Payer: Medicare HMO | Admitting: Podiatry

## 2020-12-30 DIAGNOSIS — L97522 Non-pressure chronic ulcer of other part of left foot with fat layer exposed: Secondary | ICD-10-CM

## 2020-12-30 DIAGNOSIS — E1149 Type 2 diabetes mellitus with other diabetic neurological complication: Secondary | ICD-10-CM | POA: Diagnosis not present

## 2020-12-31 ENCOUNTER — Ambulatory Visit (HOSPITAL_COMMUNITY)
Admission: RE | Admit: 2020-12-31 | Discharge: 2020-12-31 | Disposition: A | Discharge status: Home / Self Care | Payer: Medicare HMO | Attending: Vascular Surgery | Admitting: Vascular Surgery

## 2020-12-31 ENCOUNTER — Ambulatory Visit (HOSPITAL_COMMUNITY): Payer: Medicare HMO | Admitting: Anesthesiology

## 2020-12-31 ENCOUNTER — Other Ambulatory Visit: Payer: Self-pay

## 2020-12-31 ENCOUNTER — Encounter (HOSPITAL_COMMUNITY)
Admission: RE | Disposition: A | Discharge status: Home / Self Care | Payer: Self-pay | Source: Home / Self Care | Attending: Vascular Surgery

## 2020-12-31 ENCOUNTER — Encounter (HOSPITAL_COMMUNITY): Payer: Self-pay | Admitting: Vascular Surgery

## 2020-12-31 DIAGNOSIS — E785 Hyperlipidemia, unspecified: Secondary | ICD-10-CM | POA: Insufficient documentation

## 2020-12-31 DIAGNOSIS — I129 Hypertensive chronic kidney disease with stage 1 through stage 4 chronic kidney disease, or unspecified chronic kidney disease: Secondary | ICD-10-CM | POA: Diagnosis not present

## 2020-12-31 DIAGNOSIS — N186 End stage renal disease: Secondary | ICD-10-CM | POA: Diagnosis not present

## 2020-12-31 DIAGNOSIS — N184 Chronic kidney disease, stage 4 (severe): Secondary | ICD-10-CM | POA: Diagnosis not present

## 2020-12-31 DIAGNOSIS — E1122 Type 2 diabetes mellitus with diabetic chronic kidney disease: Secondary | ICD-10-CM | POA: Diagnosis not present

## 2020-12-31 DIAGNOSIS — Z794 Long term (current) use of insulin: Secondary | ICD-10-CM | POA: Diagnosis not present

## 2020-12-31 DIAGNOSIS — I120 Hypertensive chronic kidney disease with stage 5 chronic kidney disease or end stage renal disease: Secondary | ICD-10-CM | POA: Diagnosis not present

## 2020-12-31 HISTORY — PX: AV FISTULA PLACEMENT: SHX1204

## 2020-12-31 LAB — POCT I-STAT, CHEM 8
BUN: 48 mg/dL — ABNORMAL HIGH (ref 8–23)
Calcium, Ion: 1.1 mmol/L — ABNORMAL LOW (ref 1.15–1.40)
Chloride: 105 mmol/L (ref 98–111)
Creatinine, Ser: 4.2 mg/dL — ABNORMAL HIGH (ref 0.44–1.00)
Glucose, Bld: 156 mg/dL — ABNORMAL HIGH (ref 70–99)
HCT: 29 % — ABNORMAL LOW (ref 36.0–46.0)
Hemoglobin: 9.9 g/dL — ABNORMAL LOW (ref 12.0–15.0)
Potassium: 4.8 mmol/L (ref 3.5–5.1)
Sodium: 136 mmol/L (ref 135–145)
TCO2: 24 mmol/L (ref 22–32)

## 2020-12-31 LAB — GLUCOSE, CAPILLARY
Glucose-Capillary: 157 mg/dL — ABNORMAL HIGH (ref 70–99)
Glucose-Capillary: 152 mg/dL — ABNORMAL HIGH (ref 70–99)
Glucose-Capillary: 144 mg/dL — ABNORMAL HIGH (ref 70–99)

## 2020-12-31 SURGERY — ARTERIOVENOUS (AV) FISTULA CREATION
Anesthesia: Regional | Site: Arm Lower | Laterality: Left

## 2020-12-31 MED ORDER — CHLORHEXIDINE GLUCONATE 0.12 % MT SOLN
OROMUCOSAL | Status: AC
  Administered 2020-12-31: 15 mL
  Filled 2020-12-31: qty 15

## 2020-12-31 MED ORDER — CEFAZOLIN SODIUM-DEXTROSE 2-4 GM/100ML-% IV SOLN
2.0000 g | INTRAVENOUS | Status: AC
  Administered 2020-12-31: 2 g via INTRAVENOUS
  Filled 2020-12-31: qty 100

## 2020-12-31 MED ORDER — FENTANYL CITRATE (PF) 100 MCG/2ML IJ SOLN
INTRAMUSCULAR | Status: AC
  Administered 2020-12-31: 25 ug via INTRAVENOUS
  Filled 2020-12-31: qty 2

## 2020-12-31 MED ORDER — LIDOCAINE HCL (CARDIAC) PF 100 MG/5ML IV SOSY
PREFILLED_SYRINGE | INTRAVENOUS | Status: DC | PRN
  Administered 2020-12-31: 60 mg via INTRAVENOUS

## 2020-12-31 MED ORDER — SODIUM CHLORIDE 0.9 % IV SOLN: INTRAVENOUS | Status: DC | PRN

## 2020-12-31 MED ORDER — MEPERIDINE HCL 25 MG/ML IJ SOLN
6.2500 mg | INTRAMUSCULAR | Status: DC | PRN
Start: 2020-12-31 — End: 2020-12-31

## 2020-12-31 MED ORDER — SODIUM BICARBONATE 8.4 % IV SOLN
INTRAVENOUS | Status: AC
  Filled 2020-12-31: qty 50

## 2020-12-31 MED ORDER — FENTANYL CITRATE (PF) 100 MCG/2ML IJ SOLN
25.0000 ug | Freq: Once | INTRAMUSCULAR | Status: AC
Start: 2020-12-31 — End: 2020-12-31

## 2020-12-31 MED ORDER — ACETAMINOPHEN 10 MG/ML IV SOLN: 1000.0000 mg | Freq: Once | INTRAVENOUS | Status: DC | PRN

## 2020-12-31 MED ORDER — GLYCOPYRROLATE PF 0.2 MG/ML IJ SOSY
PREFILLED_SYRINGE | INTRAMUSCULAR | Status: AC
  Filled 2020-12-31: qty 1

## 2020-12-31 MED ORDER — MIDAZOLAM HCL 2 MG/2ML IJ SOLN: 1.0000 mg | Freq: Once | INTRAMUSCULAR | Status: AC

## 2020-12-31 MED ORDER — CHLORHEXIDINE GLUCONATE 4 % EX LIQD: 60.0000 mL | Freq: Once | CUTANEOUS | Status: DC

## 2020-12-31 MED ORDER — PROPOFOL 10 MG/ML IV BOLUS
INTRAVENOUS | Status: DC | PRN
  Administered 2020-12-31: 40 ug/kg/min via INTRAVENOUS

## 2020-12-31 MED ORDER — GLYCOPYRROLATE 0.2 MG/ML IJ SOLN
INTRAMUSCULAR | Status: DC | PRN
  Administered 2020-12-31: .2 mg via INTRAVENOUS

## 2020-12-31 MED ORDER — SUFENTANIL CITRATE 50 MCG/ML IV SOLN
INTRAVENOUS | Status: AC
  Filled 2020-12-31: qty 1

## 2020-12-31 MED ORDER — SODIUM CHLORIDE 0.9 % IV SOLN
INTRAVENOUS | Status: AC
  Filled 2020-12-31: qty 1.2

## 2020-12-31 MED ORDER — MIDAZOLAM HCL 2 MG/2ML IJ SOLN
INTRAMUSCULAR | Status: AC
  Administered 2020-12-31: 1 mg via INTRAVENOUS
  Filled 2020-12-31: qty 2

## 2020-12-31 MED ORDER — ACETAMINOPHEN 325 MG PO TABS
325.0000 mg | ORAL_TABLET | Freq: Once | ORAL | Status: DC | PRN
Start: 2020-12-31 — End: 2020-12-31

## 2020-12-31 MED ORDER — SODIUM CHLORIDE 0.9 % IV SOLN
INTRAVENOUS | Status: DC | PRN
  Administered 2020-12-31: 13:00:00 500 mL

## 2020-12-31 MED ORDER — HEPARIN SODIUM (PORCINE) 1000 UNIT/ML IJ SOLN
INTRAMUSCULAR | Status: DC | PRN
  Administered 2020-12-31: 5000 [IU] via INTRAVENOUS

## 2020-12-31 MED ORDER — AMISULPRIDE (ANTIEMETIC) 5 MG/2ML IV SOLN: 10.0000 mg | Freq: Once | INTRAVENOUS | Status: DC | PRN

## 2020-12-31 MED ORDER — OXYCODONE-ACETAMINOPHEN 5-325 MG PO TABS: 1.0000 | ORAL_TABLET | Freq: Four times a day (QID) | ORAL | 0 refills | Status: AC | PRN

## 2020-12-31 MED ORDER — LACTATED RINGERS IV SOLN: INTRAVENOUS | Status: DC

## 2020-12-31 MED ORDER — FENTANYL CITRATE (PF) 100 MCG/2ML IJ SOLN: 25.0000 ug | INTRAMUSCULAR | Status: DC | PRN

## 2020-12-31 MED ORDER — PAPAVERINE HCL 30 MG/ML IJ SOLN
INTRAMUSCULAR | Status: AC
  Filled 2020-12-31: qty 2

## 2020-12-31 MED ORDER — SODIUM CHLORIDE 0.9 % IV SOLN: INTRAVENOUS | Status: DC

## 2020-12-31 MED ORDER — ACETAMINOPHEN 160 MG/5ML PO SOLN: 325.0000 mg | Freq: Once | ORAL | Status: DC | PRN

## 2020-12-31 MED ORDER — LIDOCAINE HCL (PF) 1 % IJ SOLN
INTRAMUSCULAR | Status: AC
  Filled 2020-12-31: qty 30

## 2020-12-31 MED ORDER — 0.9 % SODIUM CHLORIDE (POUR BTL) OPTIME
TOPICAL | Status: DC | PRN
  Administered 2020-12-31: 1000 mL

## 2020-12-31 MED ORDER — LIDOCAINE-EPINEPHRINE (PF) 1.5 %-1:200000 IJ SOLN
INTRAMUSCULAR | Status: DC | PRN
  Administered 2020-12-31: 20 mL via PERINEURAL

## 2020-12-31 MED ORDER — MIDAZOLAM HCL 2 MG/2ML IJ SOLN
INTRAMUSCULAR | Status: AC
  Filled 2020-12-31: qty 2

## 2020-12-31 SURGICAL SUPPLY — 35 items
ARMBAND PINK RESTRICT EXTREMIT (MISCELLANEOUS) ×2 IMPLANT
BENZOIN TINCTURE PRP APPL 2/3 (GAUZE/BANDAGES/DRESSINGS) IMPLANT
CANISTER SUCT 3000ML PPV (MISCELLANEOUS) ×2 IMPLANT
CANNULA VESSEL 3MM 2 BLNT TIP (CANNULA) ×2 IMPLANT
CHLORAPREP W/TINT 26 (MISCELLANEOUS) ×2 IMPLANT
CLIP VESOCCLUDE MED 6/CT (CLIP) ×4 IMPLANT
CLIP VESOCCLUDE SM WIDE 6/CT (CLIP) ×2 IMPLANT
COVER PROBE W GEL 5X96 (DRAPES) ×2 IMPLANT
COVER WAND RF STERILE (DRAPES) IMPLANT
DERMABOND ADVANCED (GAUZE/BANDAGES/DRESSINGS) ×1
DERMABOND ADVANCED .7 DNX12 (GAUZE/BANDAGES/DRESSINGS) ×1 IMPLANT
DRAPE EXTREMITY T 121X128X90 (DISPOSABLE) IMPLANT
ELECT REM PT RETURN 9FT ADLT (ELECTROSURGICAL) ×2
ELECTRODE REM PT RTRN 9FT ADLT (ELECTROSURGICAL) ×1 IMPLANT
GLOVE SURG SS PI 8.0 STRL IVOR (GLOVE) ×2 IMPLANT
GOWN STRL REUS W/ TWL LRG LVL3 (GOWN DISPOSABLE) ×3 IMPLANT
GOWN STRL REUS W/ TWL XL LVL3 (GOWN DISPOSABLE) ×1 IMPLANT
GOWN STRL REUS W/TWL LRG LVL3 (GOWN DISPOSABLE) ×3
GOWN STRL REUS W/TWL XL LVL3 (GOWN DISPOSABLE) ×1
INSERT FOGARTY SM (MISCELLANEOUS) IMPLANT
KIT BASIN OR (CUSTOM PROCEDURE TRAY) ×2 IMPLANT
KIT TURNOVER KIT B (KITS) ×2 IMPLANT
NS IRRIG 1000ML POUR BTL (IV SOLUTION) ×2 IMPLANT
PACK CV ACCESS (CUSTOM PROCEDURE TRAY) ×2 IMPLANT
PAD ARMBOARD 7.5X6 YLW CONV (MISCELLANEOUS) ×4 IMPLANT
PENCIL SMOKE EVACUATOR (MISCELLANEOUS) ×2 IMPLANT
STRIP CLOSURE SKIN 1/2X4 (GAUZE/BANDAGES/DRESSINGS) ×2 IMPLANT
SUT MNCRL AB 4-0 PS2 18 (SUTURE) ×2 IMPLANT
SUT PROLENE 6 0 BV (SUTURE) ×4 IMPLANT
SUT SILK 2 0 SH (SUTURE) ×4 IMPLANT
SUT VIC AB 3-0 SH 27 (SUTURE) ×1
SUT VIC AB 3-0 SH 27X BRD (SUTURE) ×1 IMPLANT
TOWEL GREEN STERILE (TOWEL DISPOSABLE) ×2 IMPLANT
UNDERPAD 30X36 HEAVY ABSORB (UNDERPADS AND DIAPERS) ×2 IMPLANT
WATER STERILE IRR 1000ML POUR (IV SOLUTION) ×2 IMPLANT

## 2020-12-31 NOTE — Discharge Instructions (Signed)
° °  [facility] ° °Discharge Instructions ° °AV Fistula or Graft Surgery for Dialysis Access ° °Please refer to the following instructions for your post-procedure care. Your surgeon or physician assistant will discuss any changes with you. ° °Activity ° °You may drive the day following your surgery, if you are comfortable and no longer taking prescription pain medication. Resume full activity as the soreness in your incision resolves. ° °Bathing/Showering ° °You may shower after you go home. Keep your incision dry for 48 hours. Do not soak in a bathtub, hot tub, or swim until the incision heals completely. You may not shower if you have a hemodialysis catheter. ° °Incision Care ° °Clean your incision with mild soap and water after 48 hours. Pat the area dry with a clean towel. You do not need a bandage unless otherwise instructed. Do not apply any ointments or creams to your incision. You may have skin glue on your incision. Do not peel it off. It will come off on its own in about one week. Your arm may swell a bit after surgery. To reduce swelling use pillows to elevate your arm so it is above your heart. Your doctor will tell you if you need to lightly wrap your arm with an ACE bandage. ° °Diet ° °Resume your normal diet. There are not special food restrictions following this procedure. In order to heal from your surgery, it is CRITICAL to get adequate nutrition. Your body requires vitamins, minerals, and protein. Vegetables are the best source of vitamins and minerals. Vegetables also provide the perfect balance of protein. Processed food has little nutritional value, so try to avoid this. ° °Medications ° °Resume taking all of your medications. If your incision is causing pain, you may take over-the counter pain relievers such as acetaminophen (Tylenol). If you were prescribed a stronger pain medication, please be aware these medications can cause nausea and constipation. Prevent  nausea by taking the medication with a snack or meal. Avoid constipation by drinking plenty of fluids and eating foods with high amount of fiber, such as fruits, vegetables, and grains. Do not take Tylenol if you are taking prescription pain medications. ° ° ° ° °Follow up °Your surgeon may want to see you in the office following your access surgery. If so, this will be arranged at the time of your surgery. ° °Please call us immediately for any of the following conditions: ° °Increased pain, redness, drainage (pus) from your incision site °Fever of 101 degrees or higher °Severe or worsening pain at your incision site °Hand pain or numbness. ° °Reduce your risk of vascular disease: ° °Stop smoking. If you would like help, call [facility] at 1-800-QUIT-NOW (1-800-784-8669) or [place] at 336-586-4000 ° °Manage your cholesterol °Maintain a desired weight °Control your diabetes °Keep your blood pressure down ° °Dialysis ° °It will take several weeks to several months for your new dialysis access to be ready for use. Your surgeon will determine when it is OK to use it. Your nephrologist will continue to direct your dialysis. You can continue to use your Permcath until your new access is ready for use. ° °If you have any questions, please call the office at [phone]. ° °

## 2020-12-31 NOTE — Op Note (Signed)
DATE OF SERVICE: 12/31/2020  PATIENT:  Amanda Krueger  75 y.o. female  PRE-OPERATIVE DIAGNOSIS:  CKD IV  POST-OPERATIVE DIAGNOSIS:  Same  PROCEDURE:   Left upper extremity first stage basilic vein arteriovenous fistula  SURGEON:  Surgeon(s) and Role:    * Cherre Robins, MD - Primary  ASSISTANT: Gerri Lins, PA-C  An assistant was required to facilitate exposure and expedite the case.  ANESTHESIA:   regional and MAC  EBL: min  BLOOD ADMINISTERED:none  DRAINS: none   LOCAL MEDICATIONS USED:  NONE  SPECIMEN:  none  COUNTS: confirmed correct.  TOURNIQUET:  None  PATIENT DISPOSITION:  PACU - hemodynamically stable.   Delay start of Pharmacological VTE agent (>24hrs) due to surgical blood loss or risk of bleeding: no  INDICATION FOR PROCEDURE: Amanda Krueger is a 75 y.o. female with CKDIV in need of HD access. After careful discussion of risks, benefits, and alternatives the patient was offered left brachio-basilic AVF. We specifically discussed risk of steal syndrome. The patient understood and wished to proceed.  OPERATIVE FINDINGS: adequate vasculature for AVF creation. Palpable radial pulse at completion. Good doppler bruit on completion.  DESCRIPTION OF PROCEDURE: After identification of the patient in the pre-operative holding area, the patient was transferred to the operating room. The patient was positioned supine on the operating room table. Anesthesia was induced. The left arm was prepped and draped in standard fashion. A surgical pause was performed confirming correct patient, procedure, and operative location.  Using intraoperative ultrasound the left brachial artery and basilic vein were mapped.  A curvilinear incision was planned over the course of the two vessels to allow fistula creation.  Incision was created.  Incision was carried down through subcutaneous tissue.  The aponeurosis of the biceps tendon was divided.  The brachial sheath was identified.   The brachial artery was skeletonized.  The artery was encircled with 2 Silastic Vesseloops.  Next attention was turned to the basilic vein.  This was identified in the medial arm in its typical position.  The vein was mobilized throughout the length of the incision to allow tension-free arteriovenous fistula creation.  The distal end of the vein was clamped with a right angle.  The proximal end of the vein was clamped with a bulldog.  The vein was transected distally.  The stump was oversewn with a 2-0 silk.  The cut end of the vein was spatulated and distended with a mosquito clamp.  Patient was systemically heparinized with 3000 units of IV heparin.  After a three minute pause, the brachial artery was clamped proximally distally.  The basilic vein was anastomosed to the brachial artery into side using continuous running suture of 6-0 Prolene.  Immediately prior to completion the anastomosis was flushed and de-aired.  The anastomosis was completed.  Clamps were released.  Hemostasis was achieved.  An audible bruit was heard in the fistula.  Palpable pulse radial was felt in the left wrist.  hemostasis was achieved in the surgical bed.  The wound was closed with 3-0 Vicryl and 4-0 Monocryl.  Upon completion of the case instrument and sharps counts were confirmed correct. The patient was transferred to the PACU in good condition. I was present for all portions of the procedure.  Amanda Krueger. Amanda Breed, MD [facility] Phone Number: [phone] 12/31/2020 2:07 PM

## 2020-12-31 NOTE — Transfer of Care (Signed)
Immediate Anesthesia Transfer of Care Note  Patient: Amanda Krueger  Procedure(s) Performed: CREATION OF LEFT ARM FIRST STAGE BASILIC FISTULA (Left Arm Lower)  Patient Location: PACU  Anesthesia Type:MAC combined with regional for post-op pain  Level of Consciousness: awake, drowsy and responds to stimulation  Airway & Oxygen Therapy: Patient Spontanous Breathing  Post-op Assessment: Report given to RN, Post -op Vital signs reviewed and stable and Patient moving all extremities X 4  Post vital signs: Reviewed and stable  Last Vitals:  Vitals Value Taken Time  BP 148/59 12/31/20 1414  Temp    Pulse 68 12/31/20 1416  Resp 25 12/31/20 1416  SpO2 97 % 12/31/20 1416  Vitals shown include unvalidated device data.  Last Pain:  Vitals:   12/31/20 0953  TempSrc:   PainSc: 0-No pain         Complications: No complications documented.

## 2020-12-31 NOTE — Anesthesia Procedure Notes (Signed)
Anesthesia Regional Block: Supraclavicular block   Pre-Anesthetic Checklist: ,, timeout performed, Correct Patient, Correct Site, Correct Laterality, Correct Procedure, Correct Position, site marked, Risks and benefits discussed,  Surgical consent,  Pre-op evaluation,  At surgeon's request and post-op pain management  Laterality: Left  Prep: chloraprep       Needles:  Injection technique: Single-shot  Needle Type: Echogenic Stimulator Needle     Needle Length: 9cm  Needle Gauge: 21     Additional Needles:   Procedures:,,,, ultrasound used (permanent image in chart),,,,  Narrative:  Start time: 12/31/2020 12:15 PM End time: 12/31/2020 12:20 PM Injection made incrementally with aspirations every 5 mL.  Performed by: Personally  Anesthesiologist: Effie Berkshire, MD  Additional Notes: Patient tolerated the procedure well. Local anesthetic introduced in an incremental fashion under minimal resistance after negative aspirations. No paresthesias were elicited. After completion of the procedure, no acute issues were identified and patient continued to be monitored by RN.

## 2020-12-31 NOTE — Anesthesia Procedure Notes (Signed)
Procedure Name: MAC Date/Time: 12/31/2020 12:55 PM Performed by: Claris Che, CRNA Pre-anesthesia Checklist: Patient identified, Emergency Drugs available, Suction available, Patient being monitored and Timeout performed Patient Re-evaluated:Patient Re-evaluated prior to induction Oxygen Delivery Method: Simple face mask Dental Injury: Teeth and Oropharynx as per pre-operative assessment

## 2020-12-31 NOTE — Anesthesia Postprocedure Evaluation (Signed)
Anesthesia Post Note  Patient: Amanda Krueger  Procedure(s) Performed: CREATION OF LEFT ARM FIRST STAGE BASILIC FISTULA (Left Arm Lower)     Patient location during evaluation: PACU Anesthesia Type: Regional Level of consciousness: awake and alert Pain management: pain level controlled Vital Signs Assessment: post-procedure vital signs reviewed and stable Respiratory status: spontaneous breathing, nonlabored ventilation, respiratory function stable and patient connected to nasal cannula oxygen Cardiovascular status: stable and blood pressure returned to baseline Postop Assessment: no apparent nausea or vomiting Anesthetic complications: no   No complications documented.  Last Vitals:  Vitals:   12/31/20 1430 12/31/20 1445  BP: (!) 139/57 (!) 146/63  Pulse: 66 65  Resp: 13 (!) 27  Temp:  (!) 36.2 C  SpO2: 95% 96%    Last Pain:  Vitals:   12/31/20 1445  TempSrc:   PainSc: 0-No pain                 Effie Berkshire

## 2020-12-31 NOTE — Anesthesia Preprocedure Evaluation (Addendum)
Anesthesia Evaluation  Patient identified by MRN, date of birth, ID band Patient awake    Reviewed: Allergy & Precautions, NPO status , Patient's Chart, lab work & pertinent test results  Airway Mallampati: I  TM Distance: >3 FB Neck ROM: Full    Dental  (+) Poor Dentition, Missing, Chipped, Loose   Pulmonary neg pulmonary ROS,    breath sounds clear to auscultation (-) decreased breath sounds      Cardiovascular hypertension, Pt. on medications and Pt. on home beta blockers  Rhythm:Regular Rate:Normal     Neuro/Psych negative neurological ROS  negative psych ROS   GI/Hepatic negative GI ROS, Neg liver ROS,   Endo/Other  diabetes, Type 2, Insulin Dependent  Renal/GU ESRFRenal disease     Musculoskeletal  (+) Arthritis ,   Abdominal Normal abdominal exam  (+)   Peds  Hematology   Anesthesia Other Findings - HLD  Reproductive/Obstetrics                            Anesthesia Physical Anesthesia Plan  ASA: III  Anesthesia Plan: Regional   Post-op Pain Management:    Induction: Intravenous  PONV Risk Score and Plan: 2 and Ondansetron and Propofol infusion  Airway Management Planned: Natural Airway and Simple Face Mask  Additional Equipment: None  Intra-op Plan:   Post-operative Plan:   Informed Consent: I have reviewed the patients History and Physical, chart, labs and discussed the procedure including the risks, benefits and alternatives for the proposed anesthesia with the patient or authorized representative who has indicated his/her understanding and acceptance.       Plan Discussed with: CRNA  Anesthesia Plan Comments: (Lab Results      Component                Value               Date                      WBC                      6.3                 06/28/2020                HGB                      9.9 (L)             12/31/2020                HCT                       29.0 (L)            12/31/2020                MCV                      88.4                06/28/2020                PLT                      208  06/28/2020           )       Anesthesia Quick Evaluation

## 2020-12-31 NOTE — H&P (Signed)
Patient name: Amanda Krueger MRN: [id number] DOB: Nov 16, 1946 Sex: female  REASON FOR CONSULT: Evaluate for permanent dialysis access  HPI: Amanda Krueger is a 75 y.o. female, with history of hypertension, diabetes, hyperlipidemia and stage IV CKD that presents for evaluation of permanent dialysis access.  Patient states she has never had dialysis access in the past.  She is right-hand dominant.  Not currently on dialysis at this time.  States she has been on disability for some time.  No chest wall implants.  No previous catheter placement.  She is under the care of Dr. Theador Hawthorne with nephrology in [place].  Past Medical History:  Diagnosis Date  . Diabetes mellitus   . Diabetes mellitus due to underlying condition with stage 3 chronic kidney disease, with long-term current use of insulin ([place]) 08/15/2016  . DJD (degenerative joint disease)   . Hyperlipidemia   . Hypertension     Past Surgical History:  Procedure Laterality Date  .  rt toe debridement    . CATARACT EXTRACTION, BILATERAL     right eye-2007, left eye-2012  . COLONOSCOPY N/A 11/01/2018   Procedure: COLONOSCOPY;  Surgeon: Danie Binder, MD;  Location: [facility];  Service: Endoscopy;  Laterality: N/A;  1:00  . EXAM UNDER ANESTHESIA WITH MANIPULATION OF KNEE Left 12/26/2013   Procedure: EXAM UNDER ANESTHESIA WITH MANIPULATION OF KNEE;  Surgeon: Carole Civil, MD;  Location: [facility];  Service: Orthopedics;  Laterality: Left;  . KNEE ARTHROSCOPY Left 12/26/2013   Procedure: ARTHROSCOPY KNEE;  Surgeon: Carole Civil, MD;  Location: [facility];  Service: Orthopedics;  Laterality: Left;  . POLYPECTOMY  11/01/2018   Procedure: POLYPECTOMY;  Surgeon: Danie Binder, MD;  Location: [facility];  Service: Endoscopy;;  . right toe     Great toe  . SYNOVECTOMY Left 12/26/2013   Procedure: EXTENSIVE SYNOVECTOMY LEFT KNEE;  Surgeon: Carole Civil, MD;  Location: [facility];  Service: Orthopedics;  Laterality: Left;  .  WOUND DEBRIDEMENT Left 12/26/2013   Procedure: LEFT GREAT TOE CALLOUS DEBRIDEMENT;  Surgeon: Carole Civil, MD;  Location: [facility];  Service: Orthopedics;  Laterality: Left;    Family History  Problem Relation Age of Onset  . Diabetes Mother   . Stroke Father   . Heart disease Father   . Diabetes Sister   . Diabetes Brother   . Diabetes Sister   . Colon cancer Brother 14       DEATH DUE TO COLON CANCER  . Colon polyps Neg Hx     SOCIAL HISTORY: Social History   Socioeconomic History  . Marital status: Divorced    Spouse name: Not on file  . Number of children: Not on file  . Years of education: Not on file  . Highest education level: Not on file  Occupational History  . Not on file  Tobacco Use  . Smoking status: Never Smoker  . Smokeless tobacco: Never Used  Substance and Sexual Activity  . Alcohol use: No  . Drug use: No  . Sexual activity: Not on file  Other Topics Concern  . Not on file  Social History Narrative  . Not on file   Social Determinants of Health   Financial Resource Strain: Low Risk   . Difficulty of Paying Living Expenses: Not very hard  Food Insecurity: Not on file  Transportation Needs: Not on file  Physical Activity: Not on file  Stress: Not on file  Social Connections: Not  on file  Intimate Partner Violence: Not on file    Allergies  Allergen Reactions  . Statins Other (See Comments)    Joint pain    Current Facility-Administered Medications  Medication Dose Route Frequency Provider Last Rate Last Admin  . 0.9 %  sodium chloride infusion   Intravenous Continuous Marty Heck, MD      . ceFAZolin (ANCEF) IVPB 2g/100 mL premix  2 g Intravenous 30 min Pre-Op Marty Heck, MD      . chlorhexidine (HIBICLENS) 4 % liquid 4 application  60 mL Topical Once Marty Heck, MD       And  . Derrill Memo ON 01/01/2021] chlorhexidine (HIBICLENS) 4 % liquid 4 application  60 mL Topical Once Marty Heck, MD        Facility-Administered Medications Ordered in Other Encounters  Medication Dose Route Frequency Provider Last Rate Last Admin  . lidocaine-EPINEPHrine 1.5 %-1:200000 injection   Peri-NEURAL Anesthesia Intra-op Effie Berkshire, MD   20 mL at 12/31/20 1220    REVIEW OF SYSTEMS:  [X]  denotes positive finding, [ ]  denotes negative finding Cardiac  Comments:  Chest pain or chest pressure:    Shortness of breath upon exertion:    Short of breath when lying flat:    Irregular heart rhythm:        Vascular    Pain in calf, thigh, or hip brought on by ambulation:    Pain in feet at night that wakes you up from your sleep:     Blood clot in your veins:    Leg swelling:         Pulmonary    Oxygen at home:    Productive cough:     Wheezing:         Neurologic    Sudden weakness in arms or legs:     Sudden numbness in arms or legs:     Sudden onset of difficulty speaking or slurred speech:    Temporary loss of vision in one eye:     Problems with dizziness:         Gastrointestinal    Blood in stool:     Vomited blood:         Genitourinary    Burning when urinating:     Blood in urine:        Psychiatric    Major depression:         Hematologic    Bleeding problems:    Problems with blood clotting too easily:        Skin    Rashes or ulcers:        Constitutional    Fever or chills:      PHYSICAL EXAM: Vitals:   12/31/20 1215 12/31/20 1220 12/31/20 1225 12/31/20 1230  BP: (!) 157/48 (!) 134/48 (!) 159/51 (!) 147/53  Pulse: 66 64 66 69  Resp: 14 16 (!) 21 18  Temp:      TempSrc:      SpO2: 100% 99% 100% 100%  Weight:      Height:        GENERAL: The patient is a well-nourished female, in no acute distress. The vital signs are documented above. CARDIAC: There is a regular rate and rhythm.  VASCULAR:  Palpable radial brachial pulses bilateral upper extremities No chest wall implants PULMONARY: There is good air exchange bilaterally without wheezing or  rales. ABDOMEN: Soft and non-tender with normal pitched bowel sounds.  MUSCULOSKELETAL:  There are no major deformities or cyanosis. NEUROLOGIC: No focal weakness or paresthesias are detected. SKIN: There are no ulcers or rashes noted. PSYCHIATRIC: The patient has a normal affect.  DATA:   Upper extremity arterial duplex shows biphasic waveforms in both upper extremities.  Upper extremity vein mapping shows small surface veins except for a borderline left basilic vein that looks likely usable.  Assessment/Plan:  75 year old female with stage IV chronic kidney disease secondary to hypertension and diabetes that presents for evaluation of permanent dialysis access.  Discussed plan for placement in the nondominant arm which would be her left arm.  After review of vein mapping, discussed she has a marginal basilic vein that would hopefully be usable in the left arm and all other surface veins look small at this time.  We discussed basilic vein fistula typically being done in two stages and alternative would be AV graft.  Risk and benefits were discussed in detail including risk of bleeding, infection, failure to mature, steal syndrome.  Offered to get her scheduled later this week and she wants to wait about 3 to 4 weeks given recent death in her family.  We will go and get her scheduled today.   Marty Heck, MD [facility] [place]: [phone]   See H&P by Dr. Carlis Abbott above. No changes to H&P details. Plan LUE brachio-basilic first stage BVT in OR. Risks / benefits / alternatives discussed.  [name]. Stanford Breed, MD [facility] Phone Number: [phone] 12/31/2020 12:43 PM

## 2021-01-01 ENCOUNTER — Encounter (HOSPITAL_COMMUNITY): Payer: Self-pay | Admitting: Vascular Surgery

## 2021-01-03 DIAGNOSIS — I214 Non-ST elevation (NSTEMI) myocardial infarction: Secondary | ICD-10-CM | POA: Diagnosis not present

## 2021-01-03 DIAGNOSIS — I499 Cardiac arrhythmia, unspecified: Secondary | ICD-10-CM | POA: Diagnosis not present

## 2021-01-03 DIAGNOSIS — N179 Acute kidney failure, unspecified: Secondary | ICD-10-CM | POA: Diagnosis not present

## 2021-01-03 DIAGNOSIS — R748 Abnormal levels of other serum enzymes: Secondary | ICD-10-CM | POA: Diagnosis not present

## 2021-01-03 DIAGNOSIS — R404 Transient alteration of awareness: Secondary | ICD-10-CM | POA: Diagnosis not present

## 2021-01-03 DIAGNOSIS — N186 End stage renal disease: Secondary | ICD-10-CM | POA: Diagnosis not present

## 2021-01-03 DIAGNOSIS — I120 Hypertensive chronic kidney disease with stage 5 chronic kidney disease or end stage renal disease: Secondary | ICD-10-CM | POA: Diagnosis not present

## 2021-01-03 DIAGNOSIS — Z66 Do not resuscitate: Secondary | ICD-10-CM | POA: Diagnosis not present

## 2021-01-03 DIAGNOSIS — R4020 Unspecified coma: Secondary | ICD-10-CM | POA: Diagnosis not present

## 2021-01-03 DIAGNOSIS — J9602 Acute respiratory failure with hypercapnia: Secondary | ICD-10-CM | POA: Diagnosis not present

## 2021-01-03 DIAGNOSIS — R000 Tachycardia, unspecified: Secondary | ICD-10-CM | POA: Diagnosis not present

## 2021-01-03 DIAGNOSIS — R092 Respiratory arrest: Secondary | ICD-10-CM | POA: Diagnosis not present

## 2021-01-03 DIAGNOSIS — U071 COVID-19: Secondary | ICD-10-CM | POA: Insufficient documentation

## 2021-01-03 DIAGNOSIS — J1282 Pneumonia due to coronavirus disease 2019: Secondary | ICD-10-CM | POA: Diagnosis not present

## 2021-01-03 DIAGNOSIS — E872 Acidosis: Secondary | ICD-10-CM | POA: Diagnosis not present

## 2021-01-03 DIAGNOSIS — R4182 Altered mental status, unspecified: Secondary | ICD-10-CM | POA: Diagnosis not present

## 2021-01-03 DIAGNOSIS — R0902 Hypoxemia: Secondary | ICD-10-CM | POA: Diagnosis not present

## 2021-01-03 DIAGNOSIS — J9601 Acute respiratory failure with hypoxia: Secondary | ICD-10-CM | POA: Diagnosis not present

## 2021-01-03 DIAGNOSIS — Z992 Dependence on renal dialysis: Secondary | ICD-10-CM | POA: Diagnosis not present

## 2021-01-03 DIAGNOSIS — I469 Cardiac arrest, cause unspecified: Secondary | ICD-10-CM | POA: Diagnosis not present

## 2021-01-03 DIAGNOSIS — R778 Other specified abnormalities of plasma proteins: Secondary | ICD-10-CM | POA: Diagnosis not present

## 2021-01-04 DIAGNOSIS — I469 Cardiac arrest, cause unspecified: Secondary | ICD-10-CM | POA: Diagnosis not present

## 2021-01-04 DIAGNOSIS — U071 COVID-19: Secondary | ICD-10-CM | POA: Diagnosis not present

## 2021-01-04 DIAGNOSIS — R0902 Hypoxemia: Secondary | ICD-10-CM | POA: Diagnosis not present

## 2021-01-04 DIAGNOSIS — R778 Other specified abnormalities of plasma proteins: Secondary | ICD-10-CM | POA: Diagnosis not present

## 2021-01-04 DIAGNOSIS — E872 Acidosis: Secondary | ICD-10-CM | POA: Diagnosis not present

## 2021-01-05 DIAGNOSIS — U071 COVID-19: Secondary | ICD-10-CM | POA: Diagnosis not present

## 2021-01-05 DIAGNOSIS — N186 End stage renal disease: Secondary | ICD-10-CM | POA: Diagnosis not present

## 2021-01-05 DIAGNOSIS — R748 Abnormal levels of other serum enzymes: Secondary | ICD-10-CM | POA: Diagnosis not present

## 2021-01-05 DIAGNOSIS — R0902 Hypoxemia: Secondary | ICD-10-CM | POA: Diagnosis not present

## 2021-01-05 NOTE — Progress Notes (Signed)
Subjective: 75 year old female presents the office today for follow-up evaluation of a wound to her left big toe.  States that the area feels somewhat smaller.  Denies any drainage or pus or any swelling or redness or any red streaks.  She denies any fevers, chills, nausea, vomiting.  No calf pain, chest pain, shortness of breath.    Last A1c was 9.7.    Objective: AAO x3, NAD DP/PT pulses palpable bilaterally, CRT less than 3 seconds Sensation decreased with Semmes Weinstein monofilament.  Ulceration present the distal aspect left hallux with a granular wound base.  The wound measures 0.3 x 0.3 x 0.2 cm.  There is no probing to bone undermining or tunneling today.  Minimal edema but there is no erythema, drainage or pus or ascending cellulitis. Hallux malleus is present No pain with calf compression, swelling, warmth, erythema  Assessment: Left hallux ulceration  Plan: -All treatment options discussed with the patient including all alternatives, risks, complications.  -X-rays obtained reviewed which did not reveal any evidence of acute fracture or osteomyelitis or soft tissue edema.  Arthritic changes noted as well as digital deformity. -Sharply debrided the wound today utilizing a number 312 with scalpel to debride the wound to healthy, bleeding, granular tissue to remove nonviable tissue to promote wound healing. -Continue daily dressing changes. -Offloading shoe recommended. -Finish doxycycline -Patient encouraged to call the office with any questions, concerns, change in symptoms.   *x-ray next appointment  Trula Slade DPM

## 2021-01-06 DIAGNOSIS — E872 Acidosis, unspecified: Secondary | ICD-10-CM | POA: Insufficient documentation

## 2021-01-06 DIAGNOSIS — I469 Cardiac arrest, cause unspecified: Secondary | ICD-10-CM | POA: Diagnosis not present

## 2021-01-06 DIAGNOSIS — R778 Other specified abnormalities of plasma proteins: Secondary | ICD-10-CM | POA: Insufficient documentation

## 2021-01-06 DIAGNOSIS — N186 End stage renal disease: Secondary | ICD-10-CM | POA: Insufficient documentation

## 2021-01-13 ENCOUNTER — Ambulatory Visit: Payer: Medicare HMO | Admitting: Podiatry

## 2021-01-17 ENCOUNTER — Other Ambulatory Visit: Payer: Self-pay

## 2021-01-17 DIAGNOSIS — N184 Chronic kidney disease, stage 4 (severe): Secondary | ICD-10-CM

## 2021-01-18 DEATH — deceased

## 2021-01-20 ENCOUNTER — Ambulatory Visit: Payer: Medicare HMO | Admitting: Podiatry

## 2021-01-24 ENCOUNTER — Telehealth: Payer: Self-pay | Admitting: Pharmacist

## 2021-01-24 NOTE — Progress Notes (Signed)
Chronic Care Management Pharmacy Assistant   Name: Amanda Krueger  MRN: [id number] DOB: 1946/11/24  Reason for Encounter: Medication Review for DM.  Patient Questions:  1.  Have you seen any other providers since your last visit? Yes.   2.  Any changes in your medicines or health? No.   PCP : Alycia Rossetti, MD   Their chronic conditions include:HTN, diabetes, stage 5 CKD, hyperlipidemia, history of stroke.  Office Visits: None since 12/23/20  Consults: 12/30/20 Podiatry Wagoner, Bonna Gains, DPM. Toe ulcer on left foot. No medication changes.   Hospital: 01/03/21 [facility] + care team. Respiratory Arrest. Patient passed away.  2021/01/23 Cherre Robins, MD. CREATION OF LEFT ARM FIRST STAGE BASILIC FISTULA.   Allergies:   Allergies  Allergen Reactions  . Statins Other (See Comments)    Joint pain    Medications: Outpatient Encounter Medications as of 01/24/2021  Medication Sig  . amLODipine (NORVASC) 10 MG tablet TAKE 1 TABLET EVERY DAY (NEED MD APPOINTMENT) (Patient taking differently: Take 10 mg by mouth daily.)  . aspirin 325 MG tablet Take 1 tablet (325 mg total) by mouth daily.  . Blood Glucose Monitoring Suppl (ACCU-CHEK NANO SMARTVIEW) W/DEVICE KIT   . calcitRIOL (ROCALTROL) 0.25 MCG capsule Take 1 capsule (0.25 mcg total) by mouth every other day.  . Cholecalciferol (VITAMIN D) 2000 units CAPS Take 2,000 Units by mouth daily.   . cloNIDine (CATAPRES) 0.2 MG tablet TAKE 1 TABLET TWICE DAILY (Patient taking differently: Take 0.2 mg by mouth 2 (two) times daily.)  . furosemide (LASIX) 40 MG tablet Take 40 mg by mouth daily.  Marland Kitchen glucose blood test strip USE 1 STRIP TO CHECK GLUCOSE THREE TIMES DAILY AS  INSTRUCTED  . insulin aspart (NOVOLOG FLEXPEN) 100 UNIT/ML FlexPen Inject 4-6 units with meals TID (Patient taking differently: Inject 4-5 Units into the skin 3 (three) times daily with meals.)  . insulin regular (NOVOLIN R RELION) 100 units/mL injection  INJECT(0.04-0.06 MLS) 4-6 UNITS SUBCUTANEOUSLY THREE TIMES DAILY BEFORE MEAL(S) (Patient not taking: Reported on 12/29/2020)  . labetalol (NORMODYNE) 100 MG tablet TAKE 1 TABLET TWICE DAILY.  MAY TAKE ADDITIONAL 100 MG AS NEEDED (Patient taking differently: Take 100 mg by mouth 2 (two) times daily.)  . LANTUS 100 UNIT/ML injection INJECT 18 UNITS SUBCUTANEOUSLY ONCE DAILY (Patient taking differently: Inject 18 Units into the skin at bedtime.)  . MAGNESIUM OXIDE PO Take 250 mg by mouth daily.  . mupirocin ointment (BACTROBAN) 2 % Apply 1 application topically 2 (two) times daily. (Patient not taking: No sig reported)  . oxyCODONE-acetaminophen (PERCOCET/ROXICET) 5-325 MG tablet Take 1 tablet by mouth every 6 (six) hours as needed for severe pain.   No facility-administered encounter medications on file as of 01/24/2021.    Current Diagnosis: Patient Active Problem List   Diagnosis Date Noted  . Elevated troponin 01/29/21  . ESRD (end stage renal disease) ([place]) Jan 29, 2021  . Lactic acidosis 29-Jan-2021  . AMS (altered mental status) 01/03/2021  . Cardiac arrest ([place]) 01/03/2021  . COVID-19 virus infection 01/03/2021  . Respiratory arrest ([place]) 01/03/2021  . Chronic kidney disease (CKD), stage IV (severe) ([place]) 09/14/2020  . Amputation of right great toe ([place]) 11/18/2019  . Iron deficiency anemia 10/31/2019  . Proteinuria 10/31/2019  . Special screening for malignant neoplasms, colon   . History of stroke 01/15/2017  . Uncontrolled secondary diabetes mellitus with stage 5 CKD (GFR<15) ([place]) 08/15/2016  . Non compliance w medication regimen 06/18/2016  .  Infected blister of toe of left foot 11/15/2015  . S/P left knee arthroscopy 12/2013 01/19/2014  . Other specified postprocedural states 01/19/2014  . Chronic foot ulcer ([place]) 11/08/2013  . CKD (chronic kidney disease) stage 5, GFR less than 15 ml/min ([facility]) 03/06/2012  . Nail abnormalities 02/21/2012  . HTN (hypertension) 02/20/2012  .  DDD (degenerative disc disease), lumbar 02/20/2012  . Hyperlipidemia 02/20/2012    Goals Addressed   None    Recent Relevant Labs: Lab Results  Component Value Date/Time   HGBA1C 9.7 (H) 06/28/2020 11:28 AM   HGBA1C 9.0 (H) 02/25/2020 12:28 PM   HGBA1C 8.1 (H) 10/27/2019 12:16 PM   HGBA1C 7.7 (H) 10/22/2017 03:34 PM   MICROALBUR 84.1 06/16/2016 03:55 PM   MICROALBUR 106.5 (H) 07/12/2015 03:18 PM    Kidney Function Lab Results  Component Value Date/Time   CREATININE 4.20 (H) 12/31/2020 10:07 AM   CREATININE 3.73 (H) 06/28/2020 11:28 AM   CREATININE 3.61 (H) 10/27/2019 12:16 PM   GFRNONAA 29 (L) 01/18/2017 05:31 AM   GFRNONAA 29 (L) 10/20/2016 02:29 PM   GFRAA 33 (L) 01/18/2017 05:31 AM   GFRAA 33 (L) 10/20/2016 02:29 PM    Upon my chart prep and speaking with the patients daughter I was informed she has passed away.  30 min.  Follow-Up:  Pharmacist Review   Charlann Lange, [place] Pharmacist Assistant [phone]

## 2021-02-08 ENCOUNTER — Ambulatory Visit (HOSPITAL_COMMUNITY): Payer: Medicare HMO | Attending: Vascular Surgery

## 2021-10-03 ENCOUNTER — Encounter: Payer: Self-pay | Admitting: *Deleted
